# Patient Record
Sex: Male | Born: 1965 | Race: White | Hispanic: No | Smoking: Former smoker
Health system: Southern US, Community
[De-identification: ages and names within clinical notes are randomized; demographics above are authoritative.]

## PROBLEM LIST (undated history)

## (undated) DIAGNOSIS — E119 Type 2 diabetes mellitus without complications: Secondary | ICD-10-CM

## (undated) DIAGNOSIS — F32A Depression, unspecified: Secondary | ICD-10-CM

## (undated) DIAGNOSIS — F419 Anxiety disorder, unspecified: Secondary | ICD-10-CM

## (undated) DIAGNOSIS — F1010 Alcohol abuse, uncomplicated: Secondary | ICD-10-CM

## (undated) DIAGNOSIS — I10 Essential (primary) hypertension: Secondary | ICD-10-CM

## (undated) DIAGNOSIS — E785 Hyperlipidemia, unspecified: Secondary | ICD-10-CM

## (undated) DIAGNOSIS — F329 Major depressive disorder, single episode, unspecified: Secondary | ICD-10-CM

## (undated) DIAGNOSIS — K635 Polyp of colon: Secondary | ICD-10-CM

## (undated) DIAGNOSIS — M1990 Unspecified osteoarthritis, unspecified site: Secondary | ICD-10-CM

## (undated) DIAGNOSIS — J449 Chronic obstructive pulmonary disease, unspecified: Secondary | ICD-10-CM

## (undated) HISTORY — DX: Type 2 diabetes mellitus without complications: E11.9

## (undated) HISTORY — DX: Depression, unspecified: F32.A

## (undated) HISTORY — PX: TENDON TRANSFER: SHX2488

## (undated) HISTORY — DX: Anxiety disorder, unspecified: F41.9

## (undated) HISTORY — PX: OTHER SURGICAL HISTORY: SHX169

## (undated) HISTORY — DX: Unspecified osteoarthritis, unspecified site: M19.90

## (undated) HISTORY — DX: Hyperlipidemia, unspecified: E78.5

## (undated) HISTORY — DX: Major depressive disorder, single episode, unspecified: F32.9

## (undated) HISTORY — PX: HERNIA REPAIR: SHX51

## (undated) HISTORY — PX: KNEE RECONSTRUCTION: SHX5883

## (undated) HISTORY — DX: Polyp of colon: K63.5

## (undated) HISTORY — PX: TENDON TRANSFER: SHX6109

## (undated) HISTORY — DX: Essential (primary) hypertension: I10

---

## 2012-03-02 ENCOUNTER — Encounter: Payer: Self-pay | Admitting: Professional

## 2012-03-02 NOTE — Progress Notes (Signed)
Date/Time: 03/02/2012, 10:01 AM  Interviewer: Tristan Goodwin  Patient Name: Tristan Goodwin "[name]"  DOB: [birth year]  SSN:   Gender: male  Patient Address:  [address] [place], [place]  [address]  Patient Phone Number(s):  additional phone number: cell [phone]  Emergency Contact: Tristan Goodwin Phone [phone] Relationship to Pt: Spouse      Insurance Info:  Company: [employer]  Phone number:[phone]  Policy Holder (PH):   Tristan Goodwin  Relationship to PT: Self    PH DOB: [birth year]  Member ID: [id number]  Group #: [id number]  Insured Employer: [employer]  Insurance Address: [address] [place], [place]     Clinical Information  Presenting Problem:"I just enjoy drinking. That's it."  Precipitating Event: Wife asked him to get some help.    Suicidal/Homicidal Risk?    Any hx of Suicidal thoughts or plans? No    Any hx of Homicidal Thoughts or Plans?  No    Imminent Risk of Suicide and/or Harm to Others?  No       If answered "yes" to above question, complete RISK section below:    Patient is voluntary and has capacity to participate in program? yes   Medically stable/mobile? yes   Intact cognitive function? yes   Living situation stable to maintain outside of inpatient setting? yes   Transportation to and from the program services? yes   Additional Info (if needed):    Drug Use   Any current use of alcohol or drugs? alcohol    Drug of Choice # 1: Beer  Pattern of use: 6-12 beers daily  Last use? 03/02/2012    Drug of Choice # 2 :   Pattern of use:   Last use?     Drug of Choice # 3 :   Pattern of use:   Last use?:     Additional drug use info:      Current Withdrawal Symptoms: none reported    History of and last date of:    Hallucinations?no           Seizures?  no  DTs? no    Blackouts? no    Current Stressors and Issues:  Hx of drug/alcohol related legal issues? None reported  Current/pending charges?    Family Issues?Relationship w/spouse is strained.  Who is supportive of your tx and sobriety?none  reported    Job or finance issues? Pt reported job is tense, but not impacted by alcohol use.    Psych/Mental health Issues:Denied    Medical issues?none reported    Allergies? Denied    Current Medication, including dosage and time taken: none reported      Any previous treatment episodes or 12-step recovery experience?: none reported    Additional Notes:     Does the patient require Hard of Hearing Services ?Denied    Does the patient require language services?Denied    Preliminary Diagnosis:     Recommended Level of Care:     Appointment with:       Location:              Date:             Time:

## 2013-11-09 ENCOUNTER — Emergency Department: Payer: 59

## 2013-11-09 ENCOUNTER — Emergency Department
Admission: EM | Admit: 2013-11-09 | Discharge: 2013-11-09 | Disposition: A | Discharge status: Home / Self Care | Payer: 59 | Attending: Emergency Medicine | Admitting: Emergency Medicine

## 2013-11-09 DIAGNOSIS — M25461 Effusion, right knee: Secondary | ICD-10-CM

## 2013-11-09 DIAGNOSIS — M2391 Unspecified internal derangement of right knee: Secondary | ICD-10-CM

## 2013-11-09 DIAGNOSIS — S8990XA Unspecified injury of unspecified lower leg, initial encounter: Secondary | ICD-10-CM | POA: Insufficient documentation

## 2013-11-09 DIAGNOSIS — X58XXXA Exposure to other specified factors, initial encounter: Secondary | ICD-10-CM | POA: Insufficient documentation

## 2013-11-09 DIAGNOSIS — IMO0002 Reserved for concepts with insufficient information to code with codable children: Secondary | ICD-10-CM | POA: Insufficient documentation

## 2013-11-09 DIAGNOSIS — S8991XA Unspecified injury of right lower leg, initial encounter: Secondary | ICD-10-CM

## 2013-11-09 DIAGNOSIS — M25469 Effusion, unspecified knee: Secondary | ICD-10-CM | POA: Insufficient documentation

## 2013-11-09 DIAGNOSIS — S99929A Unspecified injury of unspecified foot, initial encounter: Secondary | ICD-10-CM | POA: Insufficient documentation

## 2013-11-09 MED ORDER — TETANUS-DIPHTHERIA TOXOIDS TD 5-2 LFU IM INJ
INJECTION | INTRAMUSCULAR | Status: AC
Start: 2013-11-09 — End: ?
  Filled 2013-11-09: qty 0.5

## 2013-11-09 MED ORDER — OXYCODONE-ACETAMINOPHEN 5-325 MG PO TABS
ORAL_TABLET | ORAL | Status: AC
Start: 2013-11-09 — End: ?
  Filled 2013-11-09: qty 1

## 2013-11-09 MED ORDER — OXYCODONE-ACETAMINOPHEN 5-325 MG PO TABS
1.0000 | ORAL_TABLET | Freq: Once | ORAL | Status: AC
Start: 2013-11-09 — End: 2013-11-09
  Administered 2013-11-09: 1 via ORAL

## 2013-11-09 MED ORDER — OXYCODONE-ACETAMINOPHEN 5-325 MG PO TABS
1.0000 | ORAL_TABLET | ORAL | Status: DC | PRN
Start: 2013-11-09 — End: 2017-09-15

## 2013-11-09 MED ORDER — TETANUS-DIPHTHERIA TOXOIDS TD 5-2 LFU IM INJ
0.5000 mL | INJECTION | Freq: Once | INTRAMUSCULAR | Status: AC
Start: 2013-11-09 — End: 2013-11-09
  Administered 2013-11-09: 0.5 mL via INTRAMUSCULAR

## 2013-11-09 MED ORDER — IBUPROFEN 800 MG PO TABS
800.0000 mg | ORAL_TABLET | Freq: Three times a day (TID) | ORAL | Status: DC | PRN
Start: 2013-11-09 — End: 2018-03-23

## 2013-11-09 NOTE — Discharge Instructions (Signed)
Knee Pain, Possible Torn Meniscus    The"meniscus"is a tough cartilage pad that cushions the inside of the knee joint. It serves as a shock absorber and spreads the weight of your body evenly across the knee joint. This prevents excess wear and tear to the bones of that joint.  The most common causes of meniscal tears are due to injury (especially related to sports) and degenerative disease (as occurs with aging).  A meniscus tear commonly occurs during a twisting injury when the knee is bent. This causes pain, swelling, reduced movement of the knee and difficulty walking. There may be popping, clicking, joint locking or inability to completely straighten the knee. Ligaments of the knee may also be injured.  Initial diagnosis of a torn meniscus is by physical exam and x-rays. In the case of an acute injury, the knee may be too painful to examine fully. A more accurate exam can be performed after the initial swelling goes down. An MRI (magnetic image scan) may be ordered to make a final diagnosis.  Initial treatment of a suspected meniscal injury is with ice and rest and preventing movement of the knee. A splint or Velcro knee immobilizer may be applied to protect the joint. Depending on the severity of the injury, surgery may be required. A cartilage injury may take 4-12 weeks to heal depending on the severity.  Home Care:  1. Stay off the injured leg as much as possible until you can walk on it without pain. If you have a lot of pain with walking, crutches or a walker may be prescribed. (These can be rented or purchased at many pharmacies and surgical or orthopedic supply stores). Follow your doctor's advice regarding when to begin bearing weight on that leg.  2. Keep your leg elevated to reduce pain and swelling. When sleeping, place a pillow under the injured leg. When sitting, support the injured leg so it is level with your waist. This is very important during the first 48 hours.  3. Apply an ice pack (ice  cubes in a plastic bag, wrapped in a towel) over the injured area for 20 minutes every 1-2 hours the first day. You can place the ice pack directly over the splint. If a Velcro knee immobilizer was applied, you can open this to apply the ice pack directly to the knee. Continue with ice packs 3-4 times a day for the next two days, then as needed for the relief of pain and swelling.  4. You may use acetaminophen (Tylenol) or ibuprofen (Motrin, Advil) to control pain, unless another pain medicine was prescribed. [NOTE: If you have chronic liver or kidney disease or ever had a stomach ulcer, talk with your doctor before using these medicines.]  5. If you were given a splint, keep it completely dry at all times. Bathe with your splint out of the water, protected with a large plastic bag, rubber-banded at the top end. If a fiberglass splint gets wet, you can dry it with a hair-dryer. If you have a Velcro knee immobilizer, you can remove this to bathe, unless told otherwise.  6. Check with your doctor before returning to sports or full work duties.  Follow Up  with your doctor, or as advised, within 1-2 weeks for another exam. Further testing may be required to assess the extent of your injury.  [NOTE: If X-rays were taken, they will be reviewed by a radiologist. You will be notified of any new findings that may affect your care.]    Get Prompt Medical Attention  if any of the following occur:   Toes or foot becomes swollen, cold, blue, numb or tingly   Pain or swelling increases over the knee or calf   Warmth or redness appears over the knee or calf   Shortness of breath or chest pain   Fever over 100.4F (38.0C)   2000-2014 [employer], [address], [place] [address]. All rights reserved. This information is not intended as a substitute for professional medical care. Always follow your healthcare professional's instructions.

## 2013-11-09 NOTE — ED Notes (Signed)
Co pain to r leg after return to bed 1 via xray.

## 2013-11-09 NOTE — ED Notes (Signed)
Pt given pillow for comfort. No other needs at this time. Family at bedside. Waiting for disposition.

## 2013-11-09 NOTE — ED Notes (Signed)
Bed: ED1-A  Expected date:   Expected time:   Means of arrival:   Comments:  EMS
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
## 2013-11-14 ENCOUNTER — Other Ambulatory Visit: Payer: Self-pay | Admitting: Orthopaedic Surgery

## 2013-11-14 DIAGNOSIS — M25561 Pain in right knee: Secondary | ICD-10-CM

## 2013-11-22 ENCOUNTER — Ambulatory Visit
Admission: RE | Admit: 2013-11-22 | Discharge: 2013-11-22 | Disposition: A | Discharge status: Home / Self Care | Payer: 59 | Source: Ambulatory Visit | Attending: Orthopaedic Surgery | Admitting: Orthopaedic Surgery

## 2013-11-22 DIAGNOSIS — M25469 Effusion, unspecified knee: Secondary | ICD-10-CM | POA: Insufficient documentation

## 2013-11-22 DIAGNOSIS — Y33XXXA Other specified events, undetermined intent, initial encounter: Secondary | ICD-10-CM | POA: Insufficient documentation

## 2013-11-22 DIAGNOSIS — S99919A Unspecified injury of unspecified ankle, initial encounter: Secondary | ICD-10-CM | POA: Insufficient documentation

## 2013-11-22 DIAGNOSIS — S8990XA Unspecified injury of unspecified lower leg, initial encounter: Secondary | ICD-10-CM | POA: Insufficient documentation

## 2013-11-22 DIAGNOSIS — S99929A Unspecified injury of unspecified foot, initial encounter: Secondary | ICD-10-CM | POA: Insufficient documentation

## 2013-11-22 DIAGNOSIS — S83509A Sprain of unspecified cruciate ligament of unspecified knee, initial encounter: Secondary | ICD-10-CM | POA: Insufficient documentation

## 2013-11-22 DIAGNOSIS — M25561 Pain in right knee: Secondary | ICD-10-CM

## 2014-05-31 HISTORY — PX: MEDIAL COLLATERAL LIGAMENT AND LATERAL COLLATERAL LIGAMENT REPAIR, KNEE: SHX2017

## 2014-05-31 HISTORY — PX: ANTERIOR CRUCIATE LIGAMENT REPAIR: SHX115

## 2015-12-09 ENCOUNTER — Emergency Department
Admission: EM | Admit: 2015-12-09 | Discharge: 2015-12-09 | Disposition: A | Discharge status: Home / Self Care | Payer: 59 | Attending: Emergency Medicine | Admitting: Emergency Medicine

## 2015-12-09 ENCOUNTER — Emergency Department: Payer: 59

## 2015-12-09 DIAGNOSIS — F419 Anxiety disorder, unspecified: Secondary | ICD-10-CM

## 2015-12-09 DIAGNOSIS — F418 Other specified anxiety disorders: Secondary | ICD-10-CM | POA: Insufficient documentation

## 2015-12-09 NOTE — Discharge Instructions (Signed)
Depression Affects Your Mind and Body  Everyone feels sad or "blue" from time to time for a few days or weeks.Depression is when these feelings don't go away and they interfere with daily life. Depression is a real illness. It makes you feel sad and helpless. It gets in the way of your life and relationships. It inhibits your ability to think and act. But, with help, you can feel better again.     "When I was depressed, I felt awful. I was so tired all the time I could hardly think, but at night I couldn't fall asleep. My head hurt. My stomach hurt. I didn't know what was wrong with me."   Depression affects your whole body  Brain chemicals affect your body as well as your mood. So depression may do more than just make you feel low. You may also feel bad physically. Depression can:   Cause trouble with mental tasks such as remembering, concentrating, or making decisions   Make you feel nervous and jumpy   Cause trouble sleeping. Or you may sleep too much   Change your appetite   Cause headaches, stomachaches, or other aches and pains   Drain your body of energy  Depression and other illness  It is common for people who have chronic health problems to also have depression. It can often be hard to tell which one caused the other. A person might become depressed after finding out they have a health problem. But some studies suggest being depressed may make certain health problems more likely. And some depressed people stop taking care of themselves. This may make them more likely to get sick.  Date Last Reviewed: 06/18/2013   2000-2016 [employer] [address], [place], [place] [address]. All rights reserved. This information is not intended as a substitute for professional medical care. Always follow your healthcare professional's instructions.          Your Body's Response to Anxiety    Normal anxiety is part of the body's natural defense system. It's an alert to a threat that is unknown,  vague, or comes from your own internal fears.While you're in this state, your feelings can range from a vague sense of worry to physical sensations such as a pounding heartbeat. These feelings make you want to react to the threat. An anxiety response is normal in many situations. But when you have an anxiety disorder, the same response can occur at the wrong times.  Anxiety can be helpful  Normal anxiety is a signal from your brain that warns you of a threat and is a normal response to help you prevent something or decrease the bad effects of something you can't control. For example, anxiety is a normal response to situations that might damage your body, separate you from a loved one, or lose your job. The symptoms of anxiety can be physical and mental.  How does it feel?  At certain times, people with anxiety may have:   Dizziness   Muscle tension or pain   Restlessness   Sleeplessness   Difficulty concentrating   Racing heartbeat   Fast breathing   Shaking or trembling   Stomachache   Diarrhea   Loss of energy   Sweating   Cold, clammy hands   Chest pain   Dry mouth  Anxiety can also be a problem  Anxiety can become a problem when it is difficult to control, occurs for months, and interferes with important parts of your  life. With an anxiety disorder, your body has the response described above, but in inappropriate ways. The response a person has depends on the anxiety disorder he or she has. With some disorders, the anxiety is way out of proportion to the threat that triggers it. With others, anxiety may occur even when there isn't a clear threat or trigger.  Who does it affect?  Some people are more prone to persistent anxiety than others. It tends to run in families, and it affects more younger people than older people. But no age, race, or gender is immune to anxiety problems.  Anxiety can be treated  The good news is that the anxiety that's disrupting your life can be treated. Working with  your doctor or other healthcare provider, you can develop skills to help you cope with anxiety. You can also gain the perspective you need to overcome your fears. Note:Good sources of support or guidance can be found at your local hospital, mental health clinic, or an employee assistance program.    If anxiety is wearing you down, here are some things you can do to cope:   Keep in mind that you can't control everything about a situation. Change what you can and let the rest take its course.   Exercise--it's a great way to relieve tension and help your body feel relaxed.   Avoid caffeine and nicotine, which can make anxiety symptoms worse.   Fight the temptation to turn to alcohol or unprescribed drugs for relief. They only make things worse in the long run.   Date Last Reviewed: 06/18/2013   2000-2016 [employer] [address] [place], [place] [address]. All rights reserved. This information is not intended as a substitute for professional medical care. Always follow your healthcare professional's instructions.
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
## 2015-12-09 NOTE — ED Provider Notes (Signed)
[facility] EMERGENCY DEPARTMENT History and Physical Exam      Patient Name: Tristan Goodwin, Tristan Goodwin  Encounter Date:  12/09/2015  Attending Physician: Theodora Blow, MD  PCP: Uvaldo Bristle, MD  Patient DOB:  1965-07-10  MRN:  [id number]  Room:  S20/S20-A      History of Presenting Illness     Chief complaint: Depression    HPI/ROS is limited by: none  HPI/ROS given by: patient    Location: Psychiatric  Duration: Several months  Severity: moderate    Alfonso Carden is a 50 y.o. male who presents with a request for a return to work note. 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 He states "ongoing for really bad divorce and very stressed out now". He states he was begun on Wellbutrin and Paxil by his PCP and does feel a noticeable improvement in his mood. He has not yet followed up with a psychiatrist as he was unable to get an appointment for greater than a month. He does have an appointment scheduled. He denies suicidal or homicidal ideation.    Review of Systems     Review of Systems   Constitutional: Negative for fever and chills.   HENT: Negative for voice change.    Eyes: Negative for photophobia and discharge.   Respiratory: Negative for shortness of breath and stridor.    Cardiovascular: Negative for chest pain and palpitations.   Gastrointestinal: Negative for vomiting and diarrhea.   Genitourinary: Negative for dysuria and difficulty urinating.   Musculoskeletal: Negative for joint swelling, gait problem and neck stiffness.   Skin: Negative for rash and wound.   Neurological: Negative for seizures and syncope.   Hematological: Negative for adenopathy. Does not bruise/bleed easily.   Psychiatric/Behavioral: Positive for behavioral problems and dysphoric mood. Negative for suicidal ideas, hallucinations, sleep disturbance, self-injury and agitation.      Allergies     Pt has No Known Allergies.    Medications     Current Outpatient Rx   Name  Route  Sig   Dispense  Refill   . buPROPion SR (WELLBUTRIN SR) 150 MG 12 hr tablet                     . ibuprofen (ADVIL,MOTRIN) 800 MG tablet    Oral    Take 1 tablet (800 mg total) by mouth every 8 (eight) hours as needed for Pain (take with food).    15 tablet    0     . metFORMIN (GLUCOPHAGE-XR) 500 MG 24 hr tablet                     . oxyCODONE-acetaminophen (PERCOCET) 5-325 MG per tablet    Oral    Take 1 tablet by mouth every 4 (four) hours as needed for Pain (do not take while driving or working).    10 tablet    0     . PARoxetine (PAXIL) 20 MG tablet                          Past Medical History     Pt has a past medical history of Diabetes mellitus; Depression; and Anxiety.    Past Surgical History     Pt has past surgical history that includes reconstructive knee surgery.    Family History     The family history includes Cancer in his father;  Diabetes in his father and mother.    Social History     Pt reports that he has quit smoking. He does not have any smokeless tobacco history on file. He reports that he does not drink alcohol or use illicit drugs.    Physical Exam     Blood pressure 154/97, pulse 96, temperature 98.3 F (36.8 C), temperature source Oral, resp. rate 16, height 1.829 m, weight 81.6 kg, SpO2 99 %.    Physical Exam   Constitutional: He is oriented to person, place, and time and well-developed, well-nourished, and in no distress. No distress.   HENT:   Head: Normocephalic and atraumatic.   Eyes: EOM are normal. Pupils are equal, round, and reactive to light. Right eye exhibits no discharge. Left eye exhibits no discharge.   Neck: Neck supple.   Cardiovascular: Normal rate, regular rhythm and normal heart sounds.    Pulmonary/Chest: Effort normal and breath sounds normal. No stridor.   Abdominal: Soft. Bowel sounds are normal. He exhibits no distension. There is no tenderness.   Musculoskeletal: Normal range of motion. He exhibits no edema or tenderness.   Neurological: He is alert and oriented to  person, place, and time. No cranial nerve deficit.   Skin: Skin is warm and dry. No rash noted.   Psychiatric: Mood, memory, affect and judgment normal.   Nursing note and vitals reviewed.     Orders Placed     No orders of the defined types were placed in this encounter.       Diagnostic Results       The results of the diagnostic studies below have been reviewed by myself:    Labs  Results     ** No results found for the last 24 hours. **          Radiologic Studies  Radiology Results (24 Hour)     ** No results found for the last 24 hours. **          EKG: none    ED Course & Treatment          MDM / Critical Care     Blood pressure 154/97, pulse 96, temperature 98.3 F (36.8 C), temperature source Oral, resp. rate 16, height 1.829 m, weight 81.6 kg, SpO2 99 %.    This patient presented with a psychiatric condition.  The patient was evaluated and their psychiatric illness has been determined by the ED MD along with appropriate ancillary mental health services, to be stable for outpatient management.  Differential diagnosis included but was not limited to depression, schizophrenia, bipolar disorder, psychosis, and suicidal or homicidal risk. The patient currently does not exhibit active suicidal or homicidal ideation and is felt to be at low risk for a suicide attempt.  The patient is not gravely disabled and has the means to obtain food and shelter.  Referrals to outpatient mental health resources have been provided prior to discharge and the patient has also been instructed to return immediately if any acute worsening their mental health occurs.  They have also been counseled to abstain from alcohol or drugs.  Diagnostic impression and plan were discussed with the patient and/or family.  Results of lab/radiology tests were reviewed and discussed with the patient and/or family. All questions were answered and concerns addressed.    This chart was generated by an EMR and may contain errors or omissions not  intended by the user.    Procedures     None  Diagnosis / Disposition     Clinical Impression  1. Anxiety and depression        Disposition  ED Disposition     Discharge Randalyn Rhea discharge to home/self care.    Condition at disposition: Stable            Prescriptions  Discharge Medication List as of 12/09/2015 10:41 AM          The documentation recorded by my scribe, Zane Herald, accurately reflects the services I personally performed and the decisions made by me. Theodora Blow MD.          Theodora Blow, MD  12/29/15 2108

## 2015-12-09 NOTE — ED Notes (Signed)
Pt reports he was given off work 6/12-6/22 by pcp for anxiety and depression. Work states they need him medically cleared to return to work.

## 2015-12-09 NOTE — ED Notes (Signed)
Crisis care at bedside for safety plan

## 2018-03-23 ENCOUNTER — Inpatient Hospital Stay
Admission: EM | Admit: 2018-03-23 | Discharge: 2018-03-24 | DRG: 896 | Disposition: A | Discharge status: Rehabilitation Facility | Payer: 59 | Attending: Internal Medicine | Admitting: Internal Medicine

## 2018-03-23 DIAGNOSIS — Y908 Blood alcohol level of 240 mg/100 ml or more: Secondary | ICD-10-CM | POA: Diagnosis present

## 2018-03-23 DIAGNOSIS — E43 Unspecified severe protein-calorie malnutrition: Secondary | ICD-10-CM | POA: Diagnosis present

## 2018-03-23 DIAGNOSIS — F10239 Alcohol dependence with withdrawal, unspecified: Secondary | ICD-10-CM | POA: Diagnosis present

## 2018-03-23 DIAGNOSIS — F10229 Alcohol dependence with intoxication, unspecified: Secondary | ICD-10-CM | POA: Diagnosis present

## 2018-03-23 DIAGNOSIS — R251 Tremor, unspecified: Secondary | ICD-10-CM

## 2018-03-23 DIAGNOSIS — K709 Alcoholic liver disease, unspecified: Secondary | ICD-10-CM | POA: Diagnosis present

## 2018-03-23 DIAGNOSIS — Z6821 Body mass index (BMI) 21.0-21.9, adult: Secondary | ICD-10-CM

## 2018-03-23 DIAGNOSIS — R110 Nausea: Secondary | ICD-10-CM

## 2018-03-23 DIAGNOSIS — Z9119 Patient's noncompliance with other medical treatment and regimen: Secondary | ICD-10-CM

## 2018-03-23 DIAGNOSIS — Z809 Family history of malignant neoplasm, unspecified: Secondary | ICD-10-CM

## 2018-03-23 DIAGNOSIS — F17210 Nicotine dependence, cigarettes, uncomplicated: Secondary | ICD-10-CM | POA: Diagnosis present

## 2018-03-23 DIAGNOSIS — Z833 Family history of diabetes mellitus: Secondary | ICD-10-CM

## 2018-03-23 DIAGNOSIS — R000 Tachycardia, unspecified: Secondary | ICD-10-CM

## 2018-03-23 DIAGNOSIS — F10231 Alcohol dependence with withdrawal delirium: Principal | ICD-10-CM | POA: Diagnosis present

## 2018-03-23 DIAGNOSIS — F10939 Alcohol use, unspecified with withdrawal, unspecified: Secondary | ICD-10-CM | POA: Diagnosis present

## 2018-03-23 DIAGNOSIS — E119 Type 2 diabetes mellitus without complications: Secondary | ICD-10-CM | POA: Diagnosis present

## 2018-03-23 DIAGNOSIS — F10931 Alcohol use, unspecified with withdrawal delirium: Secondary | ICD-10-CM

## 2018-03-23 LAB — COMPREHENSIVE METABOLIC PANEL
ALT: 225 U/L — ABNORMAL HIGH (ref 0–55)
AST (SGOT): 219 U/L — ABNORMAL HIGH (ref 10–42)
Albumin/Globulin Ratio: 1.48 Ratio (ref 0.80–2.00)
Albumin: 4.6 gm/dL (ref 3.5–5.0)
Alkaline Phosphatase: 60 U/L (ref 40–145)
Anion Gap: 24.5 mMol/L — ABNORMAL HIGH (ref 7.0–18.0)
BUN / Creatinine Ratio: 22.1 Ratio (ref 10.0–30.0)
BUN: 15 mg/dL (ref 7–22)
Bilirubin, Total: 1.1 mg/dL (ref 0.1–1.2)
CO2: 22.3 mMol/L (ref 20.0–30.0)
Calcium: 9 mg/dL (ref 8.5–10.5)
Chloride: 96 mMol/L — ABNORMAL LOW (ref 98–110)
Creatinine: 0.68 mg/dL — ABNORMAL LOW (ref 0.80–1.30)
EGFR: 109 mL/min/{1.73_m2} (ref 60–150)
Globulin: 3.1 gm/dL (ref 2.0–4.0)
Glucose: 133 mg/dL — ABNORMAL HIGH (ref 71–99)
Osmolality Calculated: 280 mOsm/kg (ref 275–300)
Potassium: 3.8 mMol/L (ref 3.5–5.3)
Protein, Total: 7.7 gm/dL (ref 6.0–8.3)
Sodium: 139 mMol/L (ref 136–147)

## 2018-03-23 LAB — ECG 12-LEAD
P Wave Axis: 51 deg
P-R Interval: 172 ms
Patient Age: 52 years
Q-T Interval(Corrected): 460 ms
Q-T Interval: 346 ms
QRS Axis: 56 deg
QRS Duration: 102 ms
T Axis: 59 years
Ventricular Rate: 106 //min

## 2018-03-23 LAB — CBC AND DIFFERENTIAL
Basophils %: 2 % (ref 0.0–3.0)
Basophils Absolute: 0.1 10*3/uL (ref 0.0–0.3)
Eosinophils %: 0.4 % (ref 0.0–7.0)
Eosinophils Absolute: 0 10*3/uL (ref 0.0–0.8)
Hematocrit: 44 % (ref 39.0–52.5)
Hemoglobin: 15.6 gm/dL (ref 13.0–17.5)
Lymphocytes Absolute: 0.8 10*3/uL (ref 0.6–5.1)
Lymphocytes: 19.5 % (ref 15.0–46.0)
MCH: 35 pg (ref 28–35)
MCHC: 36 gm/dL (ref 32–36)
MCV: 98 fL (ref 80–100)
MPV: 7.3 fL (ref 6.0–10.0)
Monocytes Absolute: 0.4 10*3/uL (ref 0.1–1.7)
Monocytes: 9.2 % (ref 3.0–15.0)
Neutrophils %: 68.9 % (ref 42.0–78.0)
Neutrophils Absolute: 2.8 10*3/uL (ref 1.7–8.6)
PLT CT: 114 10*3/uL — ABNORMAL LOW (ref 130–440)
RBC: 4.49 10*6/uL (ref 4.00–5.70)
RDW: 10.9 % — ABNORMAL LOW (ref 11.0–14.0)
WBC: 4.1 10*3/uL (ref 4.0–11.0)

## 2018-03-23 LAB — VH DEXTROSE STICK GLUCOSE: Glucose POCT: 438 mg/dL — ABNORMAL HIGH (ref 71–99)

## 2018-03-23 LAB — ETHANOL: Alcohol: 324 mg/dL (ref 0–9)

## 2018-03-23 MED ORDER — PHENOBARBITAL SODIUM 65 MG/ML IJ SOLN
INTRAMUSCULAR | Status: AC
Start: 2018-03-23 — End: ?
  Filled 2018-03-23: qty 2

## 2018-03-23 MED ORDER — LORAZEPAM 2 MG/ML IJ SOLN
INTRAMUSCULAR | Status: AC
Start: 2018-03-23 — End: ?
  Filled 2018-03-23: qty 1

## 2018-03-23 MED ORDER — INSULIN LISPRO 100 UNIT/ML SC SOPN
1.00 [IU] | PEN_INJECTOR | Freq: Every day | SUBCUTANEOUS | Status: DC | PRN
Start: 2018-03-23 — End: 2018-03-24

## 2018-03-23 MED ORDER — INFLUENZA VAC SPLIT QUAD 0.5 ML IM SUSY
0.50 mL | PREFILLED_SYRINGE | INTRAMUSCULAR | Status: DC | PRN
Start: 2018-03-23 — End: 2018-03-24

## 2018-03-23 MED ORDER — SODIUM CHLORIDE 0.9 % IV BOLUS
1000.0000 mL | Freq: Once | INTRAVENOUS | Status: AC
Start: 2018-03-23 — End: 2018-03-23
  Administered 2018-03-23: 1000 mL via INTRAVENOUS

## 2018-03-23 MED ORDER — PHENOBARBITAL 32.4 MG PO TABS
64.80 mg | ORAL_TABLET | Freq: Three times a day (TID) | ORAL | Status: DC
Start: 2018-03-23 — End: 2018-03-24
  Administered 2018-03-23 – 2018-03-24 (×3): 64.8 mg via ORAL
  Filled 2018-03-23 (×3): qty 2

## 2018-03-23 MED ORDER — ONDANSETRON HCL 4 MG/2ML IJ SOLN
4.0000 mg | Freq: Once | INTRAMUSCULAR | Status: AC
Start: 2018-03-23 — End: 2018-03-23
  Administered 2018-03-23: 4 mg via INTRAVENOUS

## 2018-03-23 MED ORDER — DIAZEPAM 5 MG PO TABS
5.00 mg | ORAL_TABLET | ORAL | Status: DC | PRN
Start: 2018-03-23 — End: 2018-03-24

## 2018-03-23 MED ORDER — LORAZEPAM 2 MG/ML IJ SOLN
0.5000 mg | Freq: Once | INTRAMUSCULAR | Status: AC
Start: 2018-03-23 — End: 2018-03-23
  Administered 2018-03-23: 0.5 mg via INTRAVENOUS

## 2018-03-23 MED ORDER — SODIUM CHLORIDE 0.9 % IJ SOLN
0.40 mg | INTRAMUSCULAR | Status: DC | PRN
Start: 2018-03-23 — End: 2018-03-24

## 2018-03-23 MED ORDER — VH VITAMIN B-1 100 MG PO TABS (WRAP)
100.00 mg | ORAL_TABLET | Freq: Every day | ORAL | Status: DC
Start: 2018-03-24 — End: 2018-03-24
  Administered 2018-03-24: 08:00:00 100 mg via ORAL
  Filled 2018-03-23: qty 1

## 2018-03-23 MED ORDER — SODIUM CHLORIDE 0.9 % IJ SOLN
3.00 mL | Freq: Three times a day (TID) | INTRAMUSCULAR | Status: DC
Start: 2018-03-23 — End: 2018-03-24
  Administered 2018-03-23 – 2018-03-24 (×2): 3 mL via INTRAVENOUS

## 2018-03-23 MED ORDER — PHENOBARBITAL SODIUM 65 MG/ML IJ SOLN
130.0000 mg | Freq: Once | INTRAMUSCULAR | Status: AC
Start: 2018-03-23 — End: 2018-03-23
  Administered 2018-03-23: 130 mg via INTRAVENOUS

## 2018-03-23 MED ORDER — INSULIN LISPRO 100 UNIT/ML SC SOPN
1.00 [IU] | PEN_INJECTOR | Freq: Every evening | SUBCUTANEOUS | Status: DC
Start: 2018-03-23 — End: 2018-03-24
  Administered 2018-03-23: 23:00:00 4 [IU] via SUBCUTANEOUS
  Filled 2018-03-23: qty 3

## 2018-03-23 MED ORDER — ENOXAPARIN SODIUM 40 MG/0.4ML SC SOLN
40.00 mg | SUBCUTANEOUS | Status: DC
Start: 2018-03-23 — End: 2018-03-24
  Filled 2018-03-23 (×2): qty 0.4

## 2018-03-23 MED ORDER — CEROVITE ADVANCED FORMULA PO TABS
1.00 | ORAL_TABLET | Freq: Every day | ORAL | Status: DC
Start: 2018-03-24 — End: 2018-03-24
  Administered 2018-03-24: 08:00:00 1 via ORAL
  Filled 2018-03-23: qty 1

## 2018-03-23 MED ORDER — FOLIC ACID 1 MG PO TABS
1.00 mg | ORAL_TABLET | Freq: Every day | ORAL | Status: DC
Start: 2018-03-24 — End: 2018-03-24
  Administered 2018-03-24: 08:00:00 1 mg via ORAL
  Filled 2018-03-23: qty 1

## 2018-03-23 MED ORDER — GLUCAGON 1 MG IJ SOLR (WRAP)
1.0000 mg | INTRAMUSCULAR | Status: DC | PRN
Start: 2018-03-23 — End: 2018-03-24

## 2018-03-23 MED ORDER — INSULIN LISPRO 100 UNIT/ML SC SOPN
1.00 [IU] | PEN_INJECTOR | Freq: Three times a day (TID) | SUBCUTANEOUS | Status: DC
Start: 2018-03-24 — End: 2018-03-24
  Administered 2018-03-24 (×2): 1 [IU] via SUBCUTANEOUS

## 2018-03-23 MED ORDER — DIAZEPAM 5 MG PO TABS
10.00 mg | ORAL_TABLET | ORAL | Status: DC | PRN
Start: 2018-03-23 — End: 2018-03-24

## 2018-03-23 MED ORDER — ONDANSETRON HCL 4 MG/2ML IJ SOLN
INTRAMUSCULAR | Status: AC
Start: 2018-03-23 — End: ?
  Filled 2018-03-23: qty 2

## 2018-03-23 MED ORDER — LORAZEPAM 2 MG/ML IJ SOLN
1.0000 mg | Freq: Once | INTRAMUSCULAR | Status: AC
Start: 2018-03-23 — End: 2018-03-23
  Administered 2018-03-23: 1 mg via INTRAVENOUS

## 2018-03-23 MED ORDER — DEXTROSE 10 % IV BOLUS
125.0000 mL | INTRAVENOUS | Status: DC | PRN
Start: 2018-03-23 — End: 2018-03-24

## 2018-03-23 NOTE — ED Notes (Signed)
Pt updated on room status and is pending transport. Pt is A&Ox4. Came in for weakness & shaking. Pt is a daily vodka drinker, last drink was at 1300. Lives at home with family. Pts most recent CIWA at 2035 was 0. Pt ambulated to the bathroom independently. Pt has had bagged lunch. Per, [facility], plan is to try and get pt admitted to [facility], pending approval from pts insurance. This will checked be in the AM.    Blood pressure 119/67, pulse (!) 109, temperature 98.1 F (36.7 C), resp. rate 21, height 1.829 m, weight 72.2 kg, SpO2 90 %.

## 2018-03-23 NOTE — H&P (Addendum)
Medicine History & Physical - [facility]  [employer]   Patient Name: Tristan Goodwin LOS: 0 days   Attending Physician: Etter Sjogren, MD PCP: Uvaldo Bristle, MD      Assessment and Plan:                                                                Alcohol intoxication/withdrawal  --Continue CIWA protocol, continue phenobarbital and Valium  --Seizure precaution, fall precaution  --[facility] trying to place the patient in inpatient rehab    Alcoholic liver disease  --Check hepatitis panel  --Monitor transaminases    Chronic alcoholism  --Has failed multiple inpatient rehab  --Counseled    Type 2 diabetes mellitus  --Sliding scale coverage for now    DVT PPx: lovenox  Dispo: Admit to telemetry  Healthcare Proxy:   Code: full code   Active Hospital Problem List  Active Problems:    * No active hospital problems. *     Subjective   History of Presenting Illness                                CC: Alcohol withdrawal  Tristan Goodwin is a 52 y.o. male patient who has history of type 2 diabetes mellitus non-insulin-dependent, chronic tobacco abuse and chronic alcoholism who presented today complaining of tremors consistent with alcohol withdrawal.  The patient apparently stopped drinking today and developed tremors which led him to come to ER for evaluation.  He is to drink half a gallon of vodka.  He realized that he has been drinking too much and decided to stop drinking today.  In the emergency room, the patient was found to have alcohol level of 325, tachycardic.  The patient was given Ativan and will be started on phenobarbital.  He denies any hallucinations.  She is he stated that his family summoned him to come to ER and stop drinking.  He admitted that he needed help.  He was seen by [facility] in ER and attempt to place him to an inpatient rehab in the next day or so.  His last inpatient rehab was in February 2019 and soon after that he relapsed.  He used to take metformin but had been  noncompliant with his follow-ups.  He was noted to have transaminitis consistent with alcoholic liver disease.    Review of Systems:    All systems were reviewed and are negative unless pertinent positive stated in HPI.  CONSTITUTIONAL: No night sweats. No fatigue. No fever or chills.   Eyes: No visual changes. No eye pain.   ENT: No runny nose. No epistaxis.   RESPIRATORY: No cough. No hemoptysis. No shortness of breath.   CARDIOVASCULAR: No chest pains. No palpitations.   GASTROINTESTINAL: No abdominal pain. No vomiting. No diarrhea or constipation.   GENITOURINARY: No urgency. No frequency. No dysuria.   MUSCULOSKELETAL: No musculoskeletal pain. No joint swelling.   NEUROLOGICAL: No headache or neck pain. No syncope or seizure.   PSYCHIATRIC: No depression, no psychosis.  SKIN: No rashes. No lesions. No petechiae.  ENDOCRINE: No unexplained weight loss. No polydipsia.   HEMATOLOGIC: No anemia. No purpura. No bleeding.   ALLERGIC AND IMMUNOLOGIC: No  pruritus. No swelling      Objective   Physical Exam:     Vitals: T:98.1 F (36.7 C),  BP:119/67, HR:(!) 107, RR:(!) 23, SaO2:90%    1) General Appearance: Alert and oriented x 4. In no acute distress.   2) Eyes: Pink conjunctiva, anicteric sclera. Pupils are equally reactive to light.  3) ENT: Oral mucosa moist with no pharyngeal congestion, erythema or swelling.  4) Neck: Supple, with full range of motion. Trachea is central, no JVD noted  5) Chest: Clear to auscultation bilaterally, no wheezes or rhonchi.  5) CVS: normal rate and regular rhythm, with no murmurs.  6) Abdomen: Soft, non-tender, no palpable mass. Bowel sounds normal. No CVA tenderness  7) Extremities: No pitting edema, pulses palpable, no calf swelling and gross no deformity.  8) Skin: Warm, dry with normal skin turgor, no rash  9) Lymphatics: No lymphadenopathy in axillary, cervical and inguinal area.   10) Neurological: Cranial nerves II-XII intact. No gross focal motor or sensory deficits  noted.  11) Psychiatric: Affect is appropriate. No hallucinations.  Patient Vitals for the past 12 hrs:   BP Temp Pulse Resp   03/23/18 2035 119/67 98.1 F (36.7 C) (!) 107 (!) 23   03/23/18 1942 123/73 -- (!) 103 (!) 29   03/23/18 1830 -- -- (!) 121 (!) 23   03/23/18 1800 148/82 -- (!) 112 18   03/23/18 1625 127/84 97.3 F (36.3 C) (!) 113 18     Weight Monitoring 11/09/2013 12/09/2015 03/23/2018   Height 60 cm 182.9 cm 182.9 cm   Height Method Stated Stated Stated   Weight 86.637 kg 81.6 kg 72.2 kg   Weight Method Stated Actual Actual   BMI (calculated) 241.2 kg/m2 24.4 kg/m2 21.6 kg/m2       EKG:    Recent Results (from the past 24 hour(s))   CBC    Collection Time: 03/23/18  4:36 PM   Result Value Ref Range    WBC 4.1 4.0 - 11.0 K/cmm    RBC 4.49 4.00 - 5.70 M/cmm    Hemoglobin 15.6 13.0 - 17.5 gm/dL    Hematocrit 91.4 78.2 - 52.5 %    MCV 98 80 - 100 fL    MCH 35 28 - 35 pg    MCHC 36 32 - 36 gm/dL    RDW 95.6 (L) 21.3 - 14.0 %    PLT CT 114 (L) 130 - 440 K/cmm    MPV 7.3 6.0 - 10.0 fL    NEUTROPHIL % 68.9 42.0 - 78.0 %    Lymphocytes 19.5 15.0 - 46.0 %    Monocytes 9.2 3.0 - 15.0 %    Eosinophils % 0.4 0.0 - 7.0 %    Basophils % 2.0 0.0 - 3.0 %    Neutrophils Absolute 2.8 1.7 - 8.6 K/cmm    Lymphocytes Absolute 0.8 0.6 - 5.1 K/cmm    Monocytes Absolute 0.4 0.1 - 1.7 K/cmm    Eosinophils Absolute 0.0 0.0 - 0.8 K/cmm    BASO Absolute 0.1 0.0 - 0.3 K/cmm   Alcohol Level    Collection Time: 03/23/18  4:36 PM   Result Value Ref Range    Alcohol 324 (HH) 0 - 9 mg/dL   CMP    Collection Time: 03/23/18  4:36 PM   Result Value Ref Range    Sodium 139 136 - 147 mMol/L    Potassium 3.8 3.5 - 5.3 mMol/L    Chloride 96 (L)  98 - 110 mMol/L    CO2 22.3 20.0 - 30.0 mMol/L    Calcium 9.0 8.5 - 10.5 mg/dL    Glucose 161 (H) 71 - 99 mg/dL    Creatinine 0.96 (L) 0.80 - 1.30 mg/dL    BUN 15 7 - 22 mg/dL    Protein, Total 7.7 6.0 - 8.3 gm/dL    Albumin 4.6 3.5 - 5.0 gm/dL    Alkaline Phosphatase 60 40 - 145 U/L    ALT 225 (H) 0 -  55 U/L    AST (SGOT) 219 (H) 10 - 42 U/L    Bilirubin, Total 1.1 0.1 - 1.2 mg/dL    Albumin/Globulin Ratio 1.48 0.80 - 2.00 Ratio    Anion Gap 24.5 (H) 7.0 - 18.0 mMol/L    BUN/Creatinine Ratio 22.1 10.0 - 30.0 Ratio    EGFR 109 60 - 150 mL/min/1.52m2    Osmolality Calculated 280 275 - 300 mOsm/kg    Globulin 3.1 2.0 - 4.0 gm/dL   ECG 12 lead (Specify reason for exam)    Collection Time: 03/23/18  8:46 PM   Result Value Ref Range    Patient Age 51 years    Patient DOB 05-30-1966     Patient Height      Patient Weight      Interpretation Text       Sinus tachycardia  Abnormal R-wave progression, early transition  No previous ECG available for comparison      Physician Interpreter      Ventricular Rate 106 //min    QRS Duration 102 ms    P-R Interval 172 ms    Q-T Interval 346 ms    Q-T Interval(Corrected) 460 ms    P Wave Axis 51 deg    QRS Axis 56 deg    T Axis 59 years        Past Medical History:   Diagnosis Date   . Anxiety    . Depression    . Diabetes mellitus       Past Surgical History:   Procedure Laterality Date   . reconstructive knee surgery        Family History   Problem Relation Age of Onset   . Diabetes Mother    . Diabetes Father    . Cancer Father       Social History     Tobacco Use   . Smoking status: Current Every Day Smoker     Packs/day: 2.00     Types: Cigarettes   . Smokeless tobacco: Never Used   Substance Use Topics   . Alcohol use: Yes     Comment: "half a gallon of vodka" every other day. States "a couple of shots" at 1300 today      No Known Allergies   No results found.   Home Medications     Med List Status:  In Progress Set By: Patrica Duel, RN at 03/23/2018  5:44 PM        No Medications         Meds given in the ED:  Medications   sodium chloride 0.9 % bolus 1,000 mL (1,000 mLs Intravenous New Bag 03/23/18 2042)   sodium chloride 0.9 % bolus 1,000 mL (0 mLs Intravenous Stopped 03/23/18 2032)   LORazepam (ATIVAN) injection 0.5 mg (0.5 mg Intravenous Given 03/23/18 1725)    LORazepam (ATIVAN) injection 1 mg (1 mg Intravenous Given 03/23/18 1822)   ondansetron (ZOFRAN) injection 4 mg (4 mg Intravenous  Given 03/23/18 1822)   PHENobarbital injection - MAINTENANCE dose 130 mg (130 mg Intravenous Given 03/23/18 2042)         Jenness Corner, MD     03/23/18,8:52 PM   MRN: [id number]                                      CSN: [id number] DOB: 12/31/1965

## 2018-03-23 NOTE — Consults (Signed)
Consults   Emailed admission's department at [facility] [facility] with info regarding patient's insurance contact and also emailed other [facility] clinician to update about current status of patient and desire to go to [facility] for detox.

## 2018-03-23 NOTE — Consults (Signed)
[facility] clinician met with patient. Patient scored a 39 on the Audit placing him in the severe risk category. Patient reports he is a daily drinker and has been for years. Patient reports he drinks about a 1/2 gallon of vodka every 2 days. Patient reports that in February 2019 he went to treatment in [place] for 34 days. Patient reports he stayed sober for a couple of days after he returned home from treatment. Patient reports he has been estranged from his two children for the past 2 years. Patient came to the hospital for help with his alcohol problem. Patient lost his job 2 weeks ago and the place where he lives yesterday. Patient wants help for his alcohol use. Patient signed a release for [facility] and clinician reached out to admission department. They currently have a bed available for the patient however, they are unable to verify his insurance until the morning. [facility] needs to verify his insurance prior to acceptance into their program. Clinician shared this information with patient's doctor and nurse and told them admissions would accept patient and have reviewed his records they just need to verify to make sure his insurance is still good through the end of the month. Spoke with the [employer] Rep. A few minutes ago and they said we would need to contact the main office at [phone] in the morning to verify patient's insurance.

## 2018-03-23 NOTE — Progress Notes (Signed)
NURSE NOTE SUMMARY  [facility] - MED TELEMETRY STEP-DOWN   Patient Name: Tristan Goodwin   Attending Physician: Jenness Corner, MD   Today's date:   03/23/2018 LOS: 0 days   Shift Summary:                                                              2200 Assumed care denies cp or sob, tele applied, assessment completed. Gevena Cotton     Provider Notifications:      Rapid Response Notifications:  Mobility:          Weight tracking:  Family Dynamic:   Last 3 Weights for the past 72 hrs (Last 3 readings):   Weight   03/23/18 2155 71.7 kg (158 lb)   03/23/18 1625 72.2 kg (159 lb 2.8 oz)             Recent Vitals Last Bowel Movement   BP: 130/77 (03/23/2018  9:47 PM)  Heart Rate: (!) 104 (03/23/2018  9:47 PM)  Temp: 98.2 F (36.8 C) (03/23/2018  9:47 PM)  Resp Rate: 15 (03/23/2018  9:47 PM)  Height: 1.829 m (6') (03/23/2018  9:55 PM)  Weight: 71.7 kg (158 lb) (03/23/2018  9:55 PM)  SpO2: 98 % (03/23/2018  9:47 PM)   No data recorded

## 2018-03-23 NOTE — ED Notes (Signed)
Dr. Tayag at bedside.

## 2018-03-23 NOTE — ED Notes (Signed)
Pt given a bagged lunch and a cup of water

## 2018-03-23 NOTE — ED Provider Notes (Addendum)
[facility]  EMERGENCY DEPARTMENT  History and Physical Exam     Patient Name: Tristan Goodwin, Tristan Goodwin  Encounter Date:  03/23/2018  Attending Physician: Elmon Else. Grace Isaac, M.D.   Room:  348/348-A  Patient DOB:  1966-04-23  Age: 52 y.o. male  MRN:  [id number]  PCP: Uvaldo Bristle, MD      MDM:  Diagnosis / Disposition:     This patient presents with alcohol withdrawal that is significant.  Evaluation and treatment has been initiated in the ER by myself and our ancillary health services and it has been determined that the patient may be unsafe for discharge due to potential threat of withdrawal, and therefore inpatient management of their illness is deemed necessary. Differential diagnosis included but was not limited to delirium tremens, alcohol abuse, polysubstance abuse, acute psychosis. The diagnostic impression and plan were discussed with the patient and/or family.  Results of lab tests were reviewed and discussed with the patient. Questions were answered and concerns addressed.  Appropriate consultation was made for admission and further treatment of this patient.          In addition to the above history, please see nursing notes. Allergies, meds, past medical, family, social hx, and the results of the diagnostic studies performed have been reviewed by myself.      Final Impression  1. Tachycardia    2. Alcohol withdrawal delirium    3. Tremor    4. Nausea        Disposition  ED Disposition     ED Disposition Condition Date/Time Comment    Admit  Thu Mar 23, 2018  8:56 PM Admitting Physician: Jenness Corner [[id number]]   Diagnosis: Alcohol withdrawal [291.81.ICD-9-CM]   Estimated Length of Stay: > or = to 2 midnights   Tentative Discharge Plan?: Home or Self Care [1]   Patient Class (>65 yrs old): Inpatient [101]            Follow up  No follow-up provider specified.    Prescriptions  There are no discharge medications for this patient.        History of Presenting Illness:     Vitals: Blood pressure  130/77, pulse (!) 104, temperature 98.2 F (36.8 C), temperature source Oral, resp. rate 15, height 1.829 m, weight 71.7 kg, SpO2 98 %.    Chief complaint: Alcohol Intoxication    HPI/ROS is limited by: none  HPI/ROS given by: patient    Location: Generalized  Quality: Tremors  Duration: 20+ Years  Severity: moderate    Tristan Goodwin is a 52 y.o. male who presents to the ED with complaint of alcohol intoxications. Patient has been a daily drinker for 20+ years. Reports he drinks about 0.5 gallons of vodka over the 2-3 days. His last drink was at 1PM today. Reports tremors, which he normally has when detoxing. Last attempt to detox was this past year. Reports he was unable to stay sober for long. Denies previous history of DTs during withdrawal. Patient is a current smoker (2 packs daily.) He has a history of COPD. Denies any other drug usage. Reports he recently became homeless yesterday and recently lost his job.         Review of Systems:  Physical Exam:     Review of Systems   Constitutional: Negative for chills, diaphoresis and fever.        Alcohol intoxication   HENT: Negative for congestion, ear pain, rhinorrhea and sore throat.    Eyes:  Negative.    Respiratory: Negative for cough, chest tightness, shortness of breath and wheezing.    Cardiovascular: Negative for chest pain, palpitations and leg swelling.   Gastrointestinal: Negative for abdominal pain, blood in stool, diarrhea, nausea and vomiting.   Endocrine: Negative.    Genitourinary: Negative for dysuria, flank pain, frequency, hematuria, penile pain, scrotal swelling, testicular pain and urgency.   Musculoskeletal: Negative for back pain, myalgias and neck pain.   Skin: Negative for rash and wound.   Allergic/Immunologic: Negative.    Neurological: Positive for tremors. Negative for dizziness, light-headedness and headaches.   Psychiatric/Behavioral: Negative for dysphoric mood, self-injury and suicidal ideas. The patient is not  nervous/anxious.    All other systems reviewed and are negative.      Physical Exam   Constitutional:   Moderately intoxicated appearing.   HENT:   Head: Normocephalic and atraumatic.   Right Ear: External ear normal.   Left Ear: External ear normal.   Eyes: Pupils are equal, round, and reactive to light. Conjunctivae and EOM are normal.   Neck: Normal range of motion. Neck supple. No thyromegaly present.   Cardiovascular: Normal rate, regular rhythm and normal heart sounds.   Pulmonary/Chest: Effort normal and breath sounds normal.   Abdominal: Soft. Bowel sounds are normal. He exhibits no distension. There is no tenderness.   Musculoskeletal: Normal range of motion.         General: No deformity or edema.   Neurological:   Tremulous in bilateral upper extremities   Skin: Skin is warm and dry. No erythema.   Psychiatric:   Disoriented. Slow to respond to question.   Nursing note and vitals reviewed.         Allergies / Medications:     Pt has No Known Allergies.    There are no discharge medications for this patient.        Past History:     Medical: Pt has a past medical history of Anxiety, Depression, and Diabetes mellitus.    Surgical: Pt  has a past surgical history that includes reconstructive knee surgery.    Family: The family history includes Cancer in his father; Diabetes in his father and mother.    Social: Pt reports that he has been smoking cigarettes. He has been smoking about 2.00 packs per day. He has never used smokeless tobacco. He reports current alcohol use. He reports that he does not use drugs.        Diagnostic Results:     The results of the diagnostic studies have been reviewed by myself:    Radiologic Studies  No results found.    Lab Studies  Labs Reviewed   CBC AND DIFFERENTIAL - Abnormal; Notable for the following components:       Result Value    RDW 10.9 (*)     PLT CT 114 (*)     All other components within normal limits   ETHANOL - Abnormal; Notable for the following components:     Alcohol 324 (*)     All other components within normal limits   COMPREHENSIVE METABOLIC PANEL - Abnormal; Notable for the following components:    Chloride 96 (*)     Glucose 133 (*)     Creatinine 0.68 (*)     ALT 225 (*)     AST (SGOT) 219 (*)     Anion Gap 24.5 (*)     All other components within normal limits   VH DEXTROSE STICK  GLUCOSE - Abnormal; Notable for the following components:    Glucose, POCT 438 (*)     All other components within normal limits   HEMOGLOBIN A1C   HEPATITIS PANEL, ACUTE   VH URINE DRUG SCREEN   MAGNESIUM   CBC AND DIFFERENTIAL   COMPREHENSIVE METABOLIC PANEL           Procedure / EKG / Image:     EKG Results     Procedure Component Value Units Date/Time    ECG 12 lead (Specify reason for exam) [[id number]] Collected:  03/23/18 2046     Updated:  03/23/18 2055     Patient Age 59 years      Patient DOB Feb 25, 1966     Patient Height --     Patient Weight --     Interpretation Text --     Sinus tachycardia  Abnormal R-wave progression, early transition  No previous ECG available for comparison  I reviewed and agree with the above findings.  Electronically Signed On 03-23-2018 20:53:58 EDT by Birdena Jubilee       Physician Interpreter Birdena Jubilee     Ventricular Rate 106 //min      QRS Duration 102 ms      P-R Interval 172 ms      Q-T Interval 346 ms      Q-T Interval(Corrected) 460 ms      P Wave Axis 51 deg      QRS Axis 56 deg      T Axis 59 years               ED Course:       Patient was reevaluated at this time.  Patient and/or family state that they are feeling better. Consulted with [facility]. They say they can transfer the patient tomorrow to [facility] for alcohol detox. They advise admission for observation until tomorrow.  Advised of Lab studies results. Advised of need for admission. Patient and/or family agreed to admission. Answered all questions. I discussed the case with the sound hospitalist who will admit the patient.     CONSULT  03/24/18 8:04 PM: Placed call to sound  hospitalist.  Call returned:  8:38PM  Physician coming to see patient          ATTESTATIONS     This chart was generated by an EMR and may contain errors or additions/omissions not intended by the user.    The documentation recorded by my scribe, Max Puray, which reflects the services I personally performed and the decisions made by me, Elmon Else. Grace Isaac, M.D.  I understand and acknowledge that I am responsible for the accuracy of the documentation    Elmon Else. Grace Isaac, M.D.          Etter Sjogren, MD  03/24/18 Royetta Crochet       Etter Sjogren, MD  03/24/18 [phone]

## 2018-03-24 ENCOUNTER — Telehealth (HOSPITAL_BASED_OUTPATIENT_CLINIC_OR_DEPARTMENT_OTHER): Payer: Self-pay | Admitting: Addiction (Substance Use Disorder)

## 2018-03-24 ENCOUNTER — Encounter (HOSPITAL_BASED_OUTPATIENT_CLINIC_OR_DEPARTMENT_OTHER): Payer: Self-pay | Admitting: Addiction (Substance Use Disorder)

## 2018-03-24 ENCOUNTER — Inpatient Hospital Stay
Admission: RE | Admit: 2018-03-24 | Discharge: 2018-03-31 | DRG: 895 | Disposition: A | Discharge status: Home / Self Care | Payer: 59 | Source: Ambulatory Visit | Attending: Addiction Medicine | Admitting: Addiction Medicine

## 2018-03-24 DIAGNOSIS — F1994 Other psychoactive substance use, unspecified with psychoactive substance-induced mood disorder: Secondary | ICD-10-CM | POA: Diagnosis present

## 2018-03-24 DIAGNOSIS — E119 Type 2 diabetes mellitus without complications: Secondary | ICD-10-CM | POA: Diagnosis present

## 2018-03-24 DIAGNOSIS — Z7984 Long term (current) use of oral hypoglycemic drugs: Secondary | ICD-10-CM

## 2018-03-24 DIAGNOSIS — Z79899 Other long term (current) drug therapy: Secondary | ICD-10-CM

## 2018-03-24 DIAGNOSIS — F10239 Alcohol dependence with withdrawal, unspecified: Principal | ICD-10-CM | POA: Diagnosis present

## 2018-03-24 DIAGNOSIS — K709 Alcoholic liver disease, unspecified: Secondary | ICD-10-CM | POA: Diagnosis present

## 2018-03-24 DIAGNOSIS — E43 Unspecified severe protein-calorie malnutrition: Secondary | ICD-10-CM

## 2018-03-24 DIAGNOSIS — F17210 Nicotine dependence, cigarettes, uncomplicated: Secondary | ICD-10-CM | POA: Diagnosis present

## 2018-03-24 DIAGNOSIS — I10 Essential (primary) hypertension: Secondary | ICD-10-CM | POA: Diagnosis present

## 2018-03-24 DIAGNOSIS — F419 Anxiety disorder, unspecified: Secondary | ICD-10-CM | POA: Diagnosis present

## 2018-03-24 DIAGNOSIS — D696 Thrombocytopenia, unspecified: Secondary | ICD-10-CM | POA: Diagnosis present

## 2018-03-24 DIAGNOSIS — R6889 Other general symptoms and signs: Secondary | ICD-10-CM | POA: Diagnosis present

## 2018-03-24 HISTORY — DX: Unspecified severe protein-calorie malnutrition: E43

## 2018-03-24 LAB — HEPATITIS PANEL, ACUTE
Hepatitis A IgM: NONREACTIVE
Hepatitis B Core IgM: NONREACTIVE
Hepatitis B Surface Antigen: NONREACTIVE
Hepatitis C Antibody: NONREACTIVE

## 2018-03-24 LAB — VH URINE DRUG SCREEN
Amphetamine: NEGATIVE
Barbiturates: POSITIVE — AB
Buprenorphine, Urine: NEGATIVE
Cannabinoids: NEGATIVE
Cocaine: NEGATIVE
Opiates: NEGATIVE
Phencyclidine: NEGATIVE
Urine Benzodiazepines: NEGATIVE
Urine Creatinine Random: 56.78 mg/dL
Urine Ecstasy Screen: NEGATIVE
Urine Fentanyl Screen: NEGATIVE
Urine Methadone Screen: NEGATIVE
Urine Oxycodone: NEGATIVE
Urine Specific Gravity: 1.009 (ref 1.001–1.040)
pH, Urine: 7.4 pH (ref 5.0–8.0)

## 2018-03-24 LAB — VH DEXTROSE STICK GLUCOSE
Glucose POCT: 266 mg/dL — ABNORMAL HIGH (ref 71–99)
Glucose POCT: 179 mg/dL — ABNORMAL HIGH (ref 71–99)
Glucose POCT: 196 mg/dL — ABNORMAL HIGH (ref 71–99)

## 2018-03-24 LAB — SYPHILIS SCREEN IGG AND IGM: Syphilis Screen IgG and IgM: NONREACTIVE

## 2018-03-24 LAB — CBC AND DIFFERENTIAL
Basophils %: 1.4 % (ref 0.0–3.0)
Basophils Absolute: 0 10*3/uL (ref 0.0–0.3)
Eosinophils %: 1.2 % (ref 0.0–7.0)
Eosinophils Absolute: 0 10*3/uL (ref 0.0–0.8)
Hematocrit: 35.9 % — ABNORMAL LOW (ref 39.0–52.5)
Hemoglobin: 12.6 gm/dL — ABNORMAL LOW (ref 13.0–17.5)
Lymphocytes Absolute: 0.9 10*3/uL (ref 0.6–5.1)
Lymphocytes: 26.7 % (ref 15.0–46.0)
MCH: 35 pg (ref 28–35)
MCHC: 35 gm/dL (ref 32–36)
MCV: 99 fL (ref 80–100)
MPV: 8.1 fL (ref 6.0–10.0)
Monocytes Absolute: 0.4 10*3/uL (ref 0.1–1.7)
Monocytes: 10.5 % (ref 3.0–15.0)
Neutrophils %: 60.2 % (ref 42.0–78.0)
Neutrophils Absolute: 2.1 10*3/uL (ref 1.7–8.6)
PLT CT: 90 10*3/uL — ABNORMAL LOW (ref 130–440)
RBC: 3.63 10*6/uL — ABNORMAL LOW (ref 4.00–5.70)
RDW: 10.7 % — ABNORMAL LOW (ref 11.0–14.0)
WBC: 3.4 10*3/uL — ABNORMAL LOW (ref 4.0–11.0)

## 2018-03-24 LAB — COMPREHENSIVE METABOLIC PANEL
ALT: 142 U/L — ABNORMAL HIGH (ref 0–55)
AST (SGOT): 107 U/L — ABNORMAL HIGH (ref 10–42)
Albumin/Globulin Ratio: 1.38 Ratio (ref 0.80–2.00)
Albumin: 3.3 gm/dL — ABNORMAL LOW (ref 3.5–5.0)
Alkaline Phosphatase: 50 U/L (ref 40–145)
Anion Gap: 14.2 mMol/L (ref 7.0–18.0)
BUN / Creatinine Ratio: 14.5 Ratio (ref 10.0–30.0)
BUN: 11 mg/dL (ref 7–22)
Bilirubin, Total: 1.2 mg/dL (ref 0.1–1.2)
CO2: 27.6 mMol/L (ref 20.0–30.0)
Calcium: 8.2 mg/dL — ABNORMAL LOW (ref 8.5–10.5)
Chloride: 101 mMol/L (ref 98–110)
Creatinine: 0.76 mg/dL — ABNORMAL LOW (ref 0.80–1.30)
EGFR: 104 mL/min/{1.73_m2} (ref 60–150)
Globulin: 2.4 gm/dL (ref 2.0–4.0)
Glucose: 285 mg/dL — ABNORMAL HIGH (ref 71–99)
Osmolality Calculated: 287 mOsm/kg (ref 275–300)
Potassium: 3.8 mMol/L (ref 3.5–5.3)
Protein, Total: 5.7 gm/dL — ABNORMAL LOW (ref 6.0–8.3)
Sodium: 139 mMol/L (ref 136–147)

## 2018-03-24 LAB — HEMOLYSIS INDEX: Hemolysis Index: 13 (ref 0–18)

## 2018-03-24 LAB — HIV AG/AB 4TH GENERATION: HIV Ag/Ab, 4th Generation: NONREACTIVE

## 2018-03-24 LAB — MAGNESIUM
Magnesium: 1.2 mg/dL — ABNORMAL LOW (ref 1.6–2.6)
Magnesium: 1.2 mg/dL — ABNORMAL LOW (ref 1.6–2.6)

## 2018-03-24 LAB — HEPATITIS A ANTIBODY, IGM: Hep A IgM: NONREACTIVE

## 2018-03-24 LAB — HEMOGLOBIN A1C: Hgb A1C, %: 6.2 %

## 2018-03-24 LAB — HEPATITIS B CORE ANTIBODY, IGM: Hepatitis B Core IgM: NONREACTIVE

## 2018-03-24 LAB — GGT: GGT: 247 U/L — ABNORMAL HIGH (ref 12–64)

## 2018-03-24 LAB — HEPATITIS B SURFACE ANTIGEN W/ REFLEX TO CONFIRMATION: Hepatitis B Surface Antigen: NONREACTIVE

## 2018-03-24 LAB — HEPATITIS C ANTIBODY: Hepatitis C, AB: NONREACTIVE

## 2018-03-24 MED ORDER — METFORMIN HCL 500 MG PO TABS
500.00 mg | ORAL_TABLET | Freq: Two times a day (BID) | ORAL | Status: DC
Start: 2018-03-24 — End: 2018-03-25
  Administered 2018-03-24 – 2018-03-25 (×3): 500 mg via ORAL
  Filled 2018-03-24 (×3): qty 1

## 2018-03-24 MED ORDER — FOLIC ACID 1 MG PO TABS
1.00 mg | ORAL_TABLET | Freq: Every day | ORAL | Status: DC
Start: 2018-03-25 — End: 2018-03-31
  Administered 2018-03-25 – 2018-03-31 (×7): 1 mg via ORAL
  Filled 2018-03-24 (×7): qty 1

## 2018-03-24 MED ORDER — METFORMIN HCL 500 MG PO TABS
500.00 mg | ORAL_TABLET | Freq: Two times a day (BID) | ORAL | 0 refills | Status: DC
Start: 2018-03-24 — End: 2018-03-27

## 2018-03-24 MED ORDER — CHLORDIAZEPOXIDE HCL 25 MG PO CAPS
50.00 mg | ORAL_CAPSULE | ORAL | Status: DC | PRN
Start: 2018-03-25 — End: 2018-03-25

## 2018-03-24 MED ORDER — NICOTINE POLACRILEX 2 MG MT GUM
2.00 mg | CHEWING_GUM | OROMUCOSAL | Status: DC | PRN
Start: 2018-03-24 — End: 2018-03-31

## 2018-03-24 MED ORDER — IBUPROFEN 600 MG PO TABS
600.00 mg | ORAL_TABLET | Freq: Four times a day (QID) | ORAL | Status: DC | PRN
Start: 2018-03-24 — End: 2018-03-29
  Administered 2018-03-26 – 2018-03-27 (×2): 600 mg via ORAL
  Filled 2018-03-24 (×2): qty 1

## 2018-03-24 MED ORDER — TAB-A-VITE/BETA CAROTENE PO TABS
1.00 | ORAL_TABLET | Freq: Every day | ORAL | Status: DC
Start: 2018-03-25 — End: 2018-03-31
  Administered 2018-03-25 – 2018-03-31 (×7): 1 via ORAL
  Filled 2018-03-24 (×7): qty 1

## 2018-03-24 MED ORDER — ONDANSETRON 4 MG PO TBDP
4.00 mg | ORAL_TABLET | Freq: Four times a day (QID) | ORAL | Status: DC | PRN
Start: 2018-03-24 — End: 2018-03-31

## 2018-03-24 MED ORDER — LOPERAMIDE HCL 2 MG PO CAPS
2.00 mg | ORAL_CAPSULE | ORAL | Status: DC | PRN
Start: 2018-03-24 — End: 2018-03-31
  Administered 2018-03-25 – 2018-03-26 (×5): 2 mg via ORAL
  Filled 2018-03-24 (×5): qty 1

## 2018-03-24 MED ORDER — CHLORDIAZEPOXIDE HCL 25 MG PO CAPS
25.00 mg | ORAL_CAPSULE | Freq: Three times a day (TID) | ORAL | Status: DC | PRN
Start: 2018-03-28 — End: 2018-03-25

## 2018-03-24 MED ORDER — FOLIC ACID 1 MG PO TABS
1.00 mg | ORAL_TABLET | Freq: Every day | ORAL | Status: AC
Start: 2018-03-25 — End: ?

## 2018-03-24 MED ORDER — TRAZODONE HCL 50 MG PO TABS
75.00 mg | ORAL_TABLET | Freq: Every evening | ORAL | Status: DC | PRN
Start: 2018-03-24 — End: 2018-03-31
  Administered 2018-03-24 – 2018-03-30 (×6): 75 mg via ORAL
  Filled 2018-03-24 (×6): qty 2

## 2018-03-24 MED ORDER — NICOTINE 21 MG/24HR TD PT24
1.00 | MEDICATED_PATCH | Freq: Every day | TRANSDERMAL | Status: DC
Start: 2018-03-24 — End: 2018-03-31
  Administered 2018-03-24 – 2018-03-31 (×7): 1 via TRANSDERMAL
  Filled 2018-03-24 (×8): qty 1

## 2018-03-24 MED ORDER — HYDROXYZINE PAMOATE 25 MG PO CAPS
25.00 mg | ORAL_CAPSULE | Freq: Four times a day (QID) | ORAL | Status: DC | PRN
Start: 2018-03-24 — End: 2018-03-31
  Administered 2018-03-26 – 2018-03-28 (×4): 25 mg via ORAL
  Filled 2018-03-24 (×4): qty 1

## 2018-03-24 MED ORDER — TUBERCULIN PPD 5 UNIT/0.1ML ID SOLN
0.10 mL | Freq: Once | INTRADERMAL | Status: DC
Start: 2018-03-25 — End: 2018-03-31

## 2018-03-24 MED ORDER — CHLORDIAZEPOXIDE HCL 5 MG PO CAPS
10.00 mg | ORAL_CAPSULE | Freq: Three times a day (TID) | ORAL | 0 refills | Status: DC | PRN
Start: 2018-03-24 — End: 2018-03-27

## 2018-03-24 MED ORDER — CHLORDIAZEPOXIDE HCL 25 MG PO CAPS
25.00 mg | ORAL_CAPSULE | ORAL | Status: DC | PRN
Start: 2018-03-26 — End: 2018-03-25

## 2018-03-24 MED ORDER — TUBERCULIN PPD 5 UNIT/0.1ML ID SOLN
0.10 mL | Freq: Once | INTRADERMAL | Status: DC
Start: 2018-03-24 — End: 2018-03-31

## 2018-03-24 MED ORDER — CHLORDIAZEPOXIDE HCL 25 MG PO CAPS
25.00 mg | ORAL_CAPSULE | Freq: Four times a day (QID) | ORAL | Status: DC | PRN
Start: 2018-03-27 — End: 2018-03-25

## 2018-03-24 MED ORDER — MAGNESIUM OXIDE 400 MG TABS (WRAP)
400.00 mg | ORAL_TABLET | Freq: Two times a day (BID) | ORAL | Status: DC
Start: 2018-03-24 — End: 2018-03-26
  Administered 2018-03-24 – 2018-03-26 (×4): 400 mg via ORAL
  Filled 2018-03-24 (×4): qty 1

## 2018-03-24 MED ORDER — THIAMINE (VITAMIN B1) 100 MG PO TABS (WRAP)
100.00 mg | ORAL_TABLET | Freq: Every day | ORAL | Status: DC
Start: 2018-03-25 — End: 2018-03-31
  Administered 2018-03-25 – 2018-03-31 (×7): 100 mg via ORAL
  Filled 2018-03-24 (×7): qty 1

## 2018-03-24 MED ORDER — FAMOTIDINE 20 MG PO TABS
20.00 mg | ORAL_TABLET | Freq: Two times a day (BID) | ORAL | Status: DC
Start: 2018-03-24 — End: 2018-03-31
  Administered 2018-03-24 – 2018-03-31 (×14): 20 mg via ORAL
  Filled 2018-03-24 (×14): qty 1

## 2018-03-24 MED ORDER — THIAMINE HCL 100 MG PO TABS
100.00 mg | ORAL_TABLET | Freq: Every day | ORAL | Status: AC
Start: 2018-03-25 — End: ?

## 2018-03-24 MED ORDER — ALUM & MAG HYDROXIDE-SIMETH 200-200-20 MG/5ML PO SUSP
30.00 mL | ORAL | Status: DC | PRN
Start: 2018-03-24 — End: 2018-03-31

## 2018-03-24 MED ORDER — THIAMINE HCL 100 MG/ML IJ SOLN
100.00 mg | Freq: Once | INTRAMUSCULAR | Status: DC
Start: 2018-03-24 — End: 2018-03-31

## 2018-03-24 MED ORDER — CHLORDIAZEPOXIDE HCL 25 MG PO CAPS
50.00 mg | ORAL_CAPSULE | ORAL | Status: DC | PRN
Start: 2018-03-24 — End: 2018-03-25
  Administered 2018-03-24 – 2018-03-25 (×3): 50 mg via ORAL
  Filled 2018-03-24 (×4): qty 2

## 2018-03-24 MED ORDER — MAGNESIUM HYDROXIDE 400 MG/5ML PO SUSP
30.00 mL | Freq: Every evening | ORAL | Status: DC | PRN
Start: 2018-03-24 — End: 2018-03-31

## 2018-03-24 NOTE — Progress Notes (Signed)
Spoke with Tessa Lerner from [facility] and patient has been accepted. Called RN to assist with arranging transport and was transferred to charge nurse to provide information to set up medical transport. Left a message and will call back after she states she is out of a meeting at 11:00.

## 2018-03-24 NOTE — Progress Notes (Signed)
[facility] PRE SCREEN    Date/Time:03/24/2018, 11:54 AM  Interviewer:Chaslyn Eisen    Patients Name (ALWAYS update Registration for Patient): Tristan Goodwin    Caller's Name; Telephone Number; Relationship to Patient: [phone]  Emergency Contact Name /Relationship/Telephone Number:  Tammy Sours Stewart/[phone]  Can we speak to this person regarding your admission?  yes     Referring Provider's Name and Telephone Number: Darol Destine, charge nurse for [facility] tele at [facility] [phone]    Does the patient require Hard of Hearing Services? no   Does the patient require Language Services? no     Insurance Information   Company: Health and safety inspector [facility]): self   Relationship to PT: self  PH DOB: [birth year]  Member ID: [id number]  End date is 03/30/18  Group #: [id number]  Blondell Reveal confirmed the coverage for this PT already through [employer], a [employer] member Hotel manager) and belongs to [employer]. Contacted Ansel Bong [phone] the [employer]'s business agent and he thought patient still has insurance and may have it for longer than the end of the month. Mr. Hollice Espy recommended verification for insurance be done by contacting Duane Lope or Juanita at the main office at [phone] and they can verify patient's insurance.    Clinical Information    Presenting Problem: Why are you seeking help? detox    Safety Information    Have you had thoughts about harming/suicide thought within the last month? no   ? If YES what were the circumstances? none  When was the last time within the 30 days that you felt this way? no    Have you had thoughts about harming/homicidal thoughts about others with the last month? no   ? If YES what were the circumstances?none  ? When was the last time within the 30days that you felt this way? no    Substance/Drug Use    Do you use tobacco? Yes, 2 ppd  Do you have a history or are you presently using drugs (including recreational drugs) or alcohol? yes   If YES, list and complete  below.  Drug of Choice # 1:Etoh, first drink at age 70  Pattern (Route, Amount) of use: BAC 324, vodka half gallon every three days  How long have you been using at this rate?   Date of Last Use? 03/24/18    Additional drug or alcohol use info? Phenobarb and ativan at ER    Are you currently experiencing Withdrawal Symptoms Yes    If YES, List: tremor    Are you presently experiencing visual hallucination or auditory hallucinations? no   If YES;   Is this substance/drug use related? no    Do you experience Seizures when withdrawing from drugs or alcohol? no     Psychiatric/Medical Information:    Are you presently receiving Psychiatric services/treatment? no   If YES,   ? Name of Provider and telephone number? none  ? Are you presently prescribed psychotropic medications? no  If YES, Please bring a list of all prescribed medications   What is your psychiatric diagnosis?  none     Do you have any medical issues that may interfere with physical mobility or treatment? no   If YES, List:     Are you Pregnant? no    Additional Comment: right leg had five surgeries    Recommended Level of Care (LOC): INPT Detox    Appointment Date: 03/24/18    Time: 5:30-6 PM    Location: [facility]

## 2018-03-24 NOTE — Telephone Encounter (Signed)
Spoke with Tristan Goodwin twice and we are waiting to hear from charge nurse about arranging the bed to bed transfer to the [facility] unit at [facility] hospital: [address] [place], [place] [address]-[place] can be contacted at [phone] to secure the bed.

## 2018-03-24 NOTE — UM Notes (Signed)
Tristan Poche, RN, BSN  Phone [phone]  Fax [phone]  Utilization Review  [facility]      Tristan Goodwin  1966-03-14  REF [id number]    Medicine History & Physical - [facility]  [facility]   Patient Name: Tristan Goodwin, Tristan Goodwin LOS: 0 days   Attending Physician: Etter Sjogren, MD PCP: Uvaldo Bristle, MD      Assessment and Plan:                                                                Alcohol intoxication/withdrawal  --Continue CIWA protocol, continue phenobarbital and Valium  --Seizure precaution, fall precaution  --[facility] trying to place the patient in inpatient rehab    Alcoholic liver disease  --Check hepatitis panel  --Monitor transaminases    Chronic alcoholism  --Has failed multiple inpatient rehab  --Counseled    Type 2 diabetes mellitus  --Sliding scale coverage for now    DVT PPx: lovenox  Dispo: Admit to telemetry  Healthcare Proxy:   Code: full code   Active Hospital Problem List  Active Problems:    * No active hospital problems. *       Subjective      History of Presenting Illness                                CC: Alcohol withdrawal  Tristan Goodwin is a 52 y.o. male patient who has history of type 2 diabetes mellitus non-insulin-dependent, chronic tobacco abuse and chronic alcoholism who presented today complaining of tremors consistent with alcohol withdrawal.  The patient apparently stopped drinking today and developed tremors which led him to come to ER for evaluation.  He is to drink half a gallon of vodka.  He realized that he has been drinking too much and decided to stop drinking today.  In the emergency room, the patient was found to have alcohol level of 325, tachycardic.  The patient was given Ativan and will be started on phenobarbital.  He denies any hallucinations.  She is he stated that his family summoned him to come to ER and stop drinking.  He admitted that he needed help.  He was seen by [facility] in ER and attempt to place him to an  inpatient rehab in the next day or so.  His last inpatient rehab was in February 2019 and soon after that he relapsed.  He used to take metformin but had been noncompliant with his follow-ups.  He was noted to have transaminitis consistent with alcoholic liver disease.    Review of Systems:    All systems were reviewed and are negative unless pertinent positive stated in HPI.  CONSTITUTIONAL: No night sweats. No fatigue. No fever or chills.   Eyes: No visual changes. No eye pain.   ENT: No runny nose. No epistaxis.   RESPIRATORY: No cough. No hemoptysis. No shortness of breath.   CARDIOVASCULAR: No chest pains. No palpitations.   GASTROINTESTINAL: No abdominal pain. No vomiting. No diarrhea or constipation.   GENITOURINARY: No urgency. No frequency. No dysuria.   MUSCULOSKELETAL: No musculoskeletal pain. No joint swelling.   NEUROLOGICAL: No headache or neck pain. No syncope or seizure.  PSYCHIATRIC: No depression, no psychosis.  SKIN: No rashes. No lesions. No petechiae.  ENDOCRINE: No unexplained weight loss. No polydipsia.   HEMATOLOGIC: No anemia. No purpura. No bleeding.   ALLERGIC AND IMMUNOLOGIC: No pruritus. No swelling       Objective          Physical Exam:      Vitals: T:98.1 F (36.7 C),  BP:119/67, HR:(!) 107, RR:(!) 23, SaO2:90%    1) General Appearance: Alert and oriented x 4. In no acute distress.   2) Eyes: Pink conjunctiva, anicteric sclera. Pupils are equally reactive to light.  3) ENT: Oral mucosa moist with no pharyngeal congestion, erythema or swelling.  4) Neck: Supple, with full range of motion. Trachea is central, no JVD noted  5) Chest: Clear to auscultation bilaterally, no wheezes or rhonchi.  5) CVS: normal rate and regular rhythm, with no murmurs.  6) Abdomen: Soft, non-tender, no palpable mass. Bowel sounds normal. No CVA tenderness  7) Extremities: No pitting edema, pulses palpable, no calf swelling and gross no deformity.  8) Skin: Warm, dry with normal skin turgor, no rash   9) Lymphatics: No lymphadenopathy in axillary, cervical and inguinal area.   10) Neurological: Cranial nerves II-XII intact. No gross focal motor or sensory deficits noted.  11) Psychiatric: Affect is appropriate. No hallucinations.  Patient Vitals for the past 12 hrs:   BP Temp Pulse Resp   03/23/18 2035 119/67 98.1 F (36.7 C) (!) 107 (!) 23   03/23/18 1942 123/73 - (!) 103 (!) 29   03/23/18 1830 - - (!) 121 (!) 23   03/23/18 1800 148/82 - (!) 112 18   03/23/18 1625 127/84 97.3 F (36.3 C) (!) 113 18     Weight Monitoring 11/09/2013 12/09/2015 03/23/2018   Height 60 cm 182.9 cm 182.9 cm   Height Method Stated Stated Stated   Weight 86.637 kg 81.6 kg 72.2 kg   Weight Method Stated Actual Actual   BMI (calculated) 241.2 kg/m2 24.4 kg/m2 21.6 kg/m2       EKG:     RecentResults          Recent Results (from the past 24 hour(s))   CBC    Collection Time: 03/23/18  4:36 PM   Result Value Ref Range    WBC 4.1 4.0 - 11.0 K/cmm    RBC 4.49 4.00 - 5.70 M/cmm    Hemoglobin 15.6 13.0 - 17.5 gm/dL    Hematocrit 16.1 09.6 - 52.5 %    MCV 98 80 - 100 fL    MCH 35 28 - 35 pg    MCHC 36 32 - 36 gm/dL    RDW 04.5 (L) 40.9 - 14.0 %    PLT CT 114 (L) 130 - 440 K/cmm    MPV 7.3 6.0 - 10.0 fL    NEUTROPHIL % 68.9 42.0 - 78.0 %    Lymphocytes 19.5 15.0 - 46.0 %    Monocytes 9.2 3.0 - 15.0 %    Eosinophils % 0.4 0.0 - 7.0 %    Basophils % 2.0 0.0 - 3.0 %    Neutrophils Absolute 2.8 1.7 - 8.6 K/cmm    Lymphocytes Absolute 0.8 0.6 - 5.1 K/cmm    Monocytes Absolute 0.4 0.1 - 1.7 K/cmm    Eosinophils Absolute 0.0 0.0 - 0.8 K/cmm    BASO Absolute 0.1 0.0 - 0.3 K/cmm   Alcohol Level    Collection Time: 03/23/18  4:36 PM  Result Value Ref Range    Alcohol 324 (HH) 0 - 9 mg/dL   CMP    Collection Time: 03/23/18  4:36 PM   Result Value Ref Range    Sodium 139 136 - 147 mMol/L    Potassium 3.8 3.5 - 5.3 mMol/L    Chloride 96 (L) 98 - 110 mMol/L    CO2 22.3 20.0 - 30.0 mMol/L    Calcium 9.0 8.5 - 10.5 mg/dL     Glucose 161 (H) 71 - 99 mg/dL    Creatinine 0.96 (L) 0.80 - 1.30 mg/dL    BUN 15 7 - 22 mg/dL    Protein, Total 7.7 6.0 - 8.3 gm/dL    Albumin 4.6 3.5 - 5.0 gm/dL    Alkaline Phosphatase 60 40 - 145 U/L    ALT 225 (H) 0 - 55 U/L    AST (SGOT) 219 (H) 10 - 42 U/L    Bilirubin, Total 1.1 0.1 - 1.2 mg/dL    Albumin/Globulin Ratio 1.48 0.80 - 2.00 Ratio    Anion Gap 24.5 (H) 7.0 - 18.0 mMol/L    BUN/Creatinine Ratio 22.1 10.0 - 30.0 Ratio    EGFR 109 60 - 150 mL/min/1.36m2    Osmolality Calculated 280 275 - 300 mOsm/kg    Globulin 3.1 2.0 - 4.0 gm/dL   ECG 12 lead (Specify reason for exam)    Collection Time: 03/23/18  8:46 PM   Result Value Ref Range    Patient Age 40 years    Patient DOB 1966/03/07     Patient Height      Patient Weight      Interpretation Text       Sinus tachycardia  Abnormal R-wave progression, early transition  No previous ECG available for comparison      Physician Interpreter      Ventricular Rate 106 //min    QRS Duration 102 ms    P-R Interval 172 ms    Q-T Interval 346 ms    Q-T Interval(Corrected) 460 ms    P Wave Axis 51 deg    QRS Axis 56 deg    T Axis 59 years            PastMedicalHistory   Past Medical History:   Diagnosis Date   . Anxiety    . Depression    . Diabetes mellitus          PastSurgicalHistory         Past Surgical History:   Procedure Laterality Date   . reconstructive knee surgery           FamilyHistory           Family History   Problem Relation Age of Onset   . Diabetes Mother    . Diabetes Father    . Cancer Father          Social History           Tobacco Use   . Smoking status: Current Every Day Smoker     Packs/day: 2.00     Types: Cigarettes   . Smokeless tobacco: Never Used   Substance Use Topics   . Alcohol use: Yes     Comment: "half a gallon of vodka" every other day. States "a couple of shots" at 1300 today      No Known Allergies   No results found.       Home Medications     Med List  Status:  In  Progress Set By: Patrica Duel, RN at 03/23/2018  5:44 PM        No Medications         Meds given in the ED:  Medications   sodium chloride 0.9 % bolus 1,000 mL (1,000 mLs Intravenous New Bag 03/23/18 2042)   sodium chloride 0.9 % bolus 1,000 mL (0 mLs Intravenous Stopped 03/23/18 2032)   LORazepam (ATIVAN) injection 0.5 mg (0.5 mg Intravenous Given 03/23/18 1725)   LORazepam (ATIVAN) injection 1 mg (1 mg Intravenous Given 03/23/18 1822)   ondansetron (ZOFRAN) injection 4 mg (4 mg Intravenous Given 03/23/18 1822)   PHENobarbital injection - MAINTENANCE dose 130 mg (130 mg Intravenous Given 03/23/18 2042)             Jenness Corner, MD     03/23/18,8:52 PM   MRN: [id number]                                      CSN: [id number] DOB: 17-Oct-1965                 Ativan 0.5mg  x1, phenobarvital 64.8mg  Q8 po, CIWA, consult SW, tele, seizure precaution,

## 2018-03-24 NOTE — Progress Notes (Signed)
03/24/18 1313   Case Management Quick Doc   Case Management Assessment Status Assessment Complete   CM Comments RNCM 03/24/18:  Admitted for Alcohol detox.  Is homeless.  Agreeable to Detox treatment at [place] [facility] [facility]-they have confirmed insurance and are ready to accept pt today.  RN to call report to [phone], MD aware.  Transport arranged for 1530.     Silver Huguenin RN  Case Manager  [phone]

## 2018-03-24 NOTE — Discharge Summary (Signed)
Medicine Discharge Summary - [facility]  [employer]   Patient Name: Randalyn Rhea   Attending Physician: Obie Dredge, * PCP: Uvaldo Bristle, MD   Date of Admission: 03/23/2018 D/C Date: 03/24/2018   Discharge Diagnoses:     Alcohol dependence with withdrawal    Alcoholic liver disease    Chronic alcoholism    Type 2 diabetes mellitus, HgbA1c 6.2     Tobacco dependent    Severe Protein Energy Malnutrition      Hospital Course     Jaciel Diem is a 52 y.o. male patient who has history of type 2 diabetes mellitus non-insulin-dependent, chronic tobacco abuse and chronic alcoholism who presented today complaining of tremors consistent with alcohol withdrawal.  The patient apparently stopped drinking , attempted to detoxify and developed tremors which led him to come to ER for evaluation.  He drinks  half a gallon of vodka.  He realized that he has been drinking too much and decided to stop drinking on the day of admission.  In the emergency room, the patient was found to have alcohol level of 325, tachycardic.   He denies any hallucinations.  She is he stated that his family summoned him to come to ER and stop drinking.  He admitted that he needed help.  He was seen by [facility] in ER and attempt to place him to an inpatient rehab.   His last inpatient rehab was in February 2019 and soon after that he relapsed.  He used to take metformin but had been noncompliant with his follow-ups.  He was noted to have transaminitis consistent with alcoholic liver disease.  Patient was admitted and be monitored on CIWA protocol with score driven benzodiazepine as Valium ; also was given phenobarbital .  Also started on thiamine and folic acid.  Patient has no severe withdrawal symptoms and his CIWA score of  4-6.  Patient is agreeable to detoxified treatment at [facility] and has been accepted in the facility and he is being transferred to.  Patient is stable for discharge to inpatient  substance abuse rehab facility and he is to continue initial detoxification process per their protocol.    Active Problems:    Alcohol withdrawal    Severe protein-calorie malnutrition  Resolved Problems:    * No resolved hospital problems. *        Discharge Instructions:        Disposition: Inpatient alcohol Detox  rehab facility  Diet: Regular Diet  Activity: As tolerated  Discharge Code Status: Full Code  Uvaldo Bristle, MD  [address]  [address]  [place] [address]  [phone]           Discharge Medications:                                                                      Discharge Medication List      Taking    chlordiazePOXIDE 5 MG capsule  Dose:  10 mg  Commonly known as:  LIBRIUM  Take 2 capsules (10 mg total) by mouth 3 (three) times daily as needed (Alcohol withdrawal)     folic acid 1 MG tablet  Dose:  1 mg  Commonly known  as:  FOLVITE  Start taking on:  March 25, 2018  Take 1 tablet (1 mg total) by mouth daily     metFORMIN 500 MG tablet  Dose:  500 mg  Commonly known as:  GLUCOPHAGE  Take 1 tablet (500 mg total) by mouth 2 (two) times daily with meals     thiamine 100 MG tablet  Dose:  100 mg  Commonly known as:  B-1  Start taking on:  March 25, 2018  Take 1 tablet (100 mg total) by mouth daily           Discharge Day Exam (03/24/2018):   Blood pressure 140/88, pulse 91, temperature 98.4 F (36.9 C), temperature source Oral, resp. rate 17, height 1.829 m (6'), weight 71.7 kg (158 lb), SpO2 95 %.    General: Patient is awake. In no acute distress.  HEENT: PERRL, EOMI, no conjunctival drainage, vision is intact.  Neck: supple, no thyromegaly.  Chest: CTA bilaterally. No rhonchi, no wheezing. No use of accessory muscles.  CVS: normal rate and regular rhythm no murmurs, without JVD.  Abdomen: soft, non-tender, no guarding or rigidity, with normal bowel sounds.  Extremities: No pitting edema, pulses palpable, no calf swelling and gross no deformity.  Skin: Warm, dry, no rash and no  worrisome lesions.  NEURO: no motor or sensory deficits, some tremors   Psychiatric: alert, interactive, appropriate, normal affect.   Recent Labs    Recent Labs   Lab 03/24/18  0244 03/23/18  1636   WBC 3.4* 4.1   RBC 3.63* 4.49   Hemoglobin 12.6* 15.6   Hematocrit 35.9* 44.0   MCV 99 98   PLT CT 90* 114*             Lab Results   Component Value Date    HGBA1CPERCNT 6.2 03/23/2018     Recent Labs   Lab 03/24/18  0244 03/23/18  1636   Glucose 285* 133*   Sodium 139 139   Potassium 3.8 3.8   Chloride 101 96*   CO2 27.6 22.3   BUN 11 15   Creatinine 0.76* 0.68*   EGFR 104 109   Calcium 8.2* 9.0     Recent Labs   Lab 03/24/18  0244 03/23/18  1636   Magnesium 1.2*  --    Albumin 3.3* 4.6   Protein, Total 5.7* 7.7   Bilirubin, Total 1.2 1.1   Alkaline Phosphatase 50 60   ALT 142* 225*   AST (SGOT) 107* 219*      Allergies:      Patient has no known allergies.   Time spent on discharging the patient:  32  minutes   No results found.   Obie Dredge, MD         03/24/18 2:28 PM   MRN: [id number]                                      CSN: [id number] DOB: 12-18-1965

## 2018-03-24 NOTE — UM Notes (Signed)
Tristan Poche, RN, BSN  Phone [phone]  Fax [phone]  Utilization Review  [facility]      Tristan Goodwin  [birth year]  Ref [id number]  Pt is being transferred to [place] [facility]        Medicine Discharge Summary - [facility]  [employer]   Patient Name: Tristan Goodwin   Attending Physician: Obie Dredge, * PCP: Uvaldo Bristle, MD   Date of Admission: 03/23/2018 D/C Date: 03/24/2018   Discharge Diagnoses:     Alcohol dependence with withdrawal    Alcoholic liver disease    Chronic alcoholism    Type 2 diabetes mellitus, HgbA1c 6.2     Tobacco dependent    Severe Protein Energy Malnutrition      Hospital Course     Tristan Goodwin is a 52 y.o. male patient who has history of type 2 diabetes mellitus non-insulin-dependent, chronic tobacco abuse and chronic alcoholism who presented today complaining of tremors consistent with alcohol withdrawal. The patient apparently stopped drinking , attempted to detoxify and developed tremors which led him to come to ER for evaluation. He drinks  half a gallon of vodka. He realized that he has been drinking too much and decided to stop drinking on the day of admission. In the emergency room, the patient was found to have alcohol level of 325, tachycardic. He denies any hallucinations. She is he stated that his family summonedhim to come to ER and stop drinking. He admitted that he needed help. He was seen by [facility] in ER and attempt to place him to an inpatient rehab.  His last inpatient rehab was in February 2019 and soon after that he relapsed. He used to take metformin but had been noncompliant with his follow-ups. He was noted to have transaminitis consistent with alcoholic liver disease.  Patient was admitted and be monitored on CIWA protocol with score driven benzodiazepine as Valium ; also was given phenobarbital .  Also started on thiamine and folic acid.  Patient has no severe withdrawal symptoms and his  CIWA score of  4-6.  Patient is agreeable to detoxified treatment at [facility] and has been accepted in the facility and he is being transferred to.  Patient is stable for discharge to inpatient substance abuse rehab facility and he is to continue initial detoxification process per their protocol.    Active Problems:    Alcohol withdrawal    Severe protein-calorie malnutrition  Resolved Problems:    * No resolved hospital problems. *        Discharge Instructions:        Disposition: Inpatient alcohol Detox  rehab facility  Diet: Regular Diet  Activity: As tolerated  Discharge Code Status: Full Code  Uvaldo Bristle, MD  [address]  [address]  [place] [address]  [phone]           Discharge Medications:                                                                          Discharge Medication List      Taking    chlordiazePOXIDE 5 MG capsule  Dose:  10 mg  Commonly known as:  LIBRIUM  Take 2 capsules (10 mg total) by mouth 3 (three) times daily as needed (Alcohol withdrawal)     folic acid 1 MG tablet  Dose:  1 mg  Commonly known as:  FOLVITE  Start taking on:  March 25, 2018  Take 1 tablet (1 mg total) by mouth daily     metFORMIN 500 MG tablet  Dose:  500 mg  Commonly known as:  GLUCOPHAGE  Take 1 tablet (500 mg total) by mouth 2 (two) times daily with meals     thiamine 100 MG tablet  Dose:  100 mg  Commonly known as:  B-1  Start taking on:  March 25, 2018  Take 1 tablet (100 mg total) by mouth daily           Discharge Day Exam (03/24/2018):   Blood pressure 140/88, pulse 91, temperature 98.4 F (36.9 C), temperature source Oral, resp. rate 17, height 1.829 m (6'), weight 71.7 kg (158 lb), SpO2 95 %.    General: Patient is awake. In no acute distress.  HEENT: PERRL, EOMI, no conjunctival drainage, vision is intact.  Neck: supple, no thyromegaly.  Chest: CTA bilaterally. No rhonchi, no wheezing. No use of accessory muscles.  CVS: normal rate and regular rhythm no murmurs,  without JVD.  Abdomen: soft, non-tender, no guarding or rigidity, with normal bowel sounds.  Extremities: No pitting edema, pulses palpable, no calf swelling and gross no deformity.  Skin: Warm, dry, no rash and no worrisome lesions.  NEURO: no motor or sensory deficits, some tremors   Psychiatric: alert, interactive, appropriate, normal affect.   Recent Labs         Recent Labs   Lab 03/24/18  0244 03/23/18  1636   WBC 3.4* 4.1   RBC 3.63* 4.49   Hemoglobin 12.6* 15.6   Hematocrit 35.9* 44.0   MCV 99 98   PLT CT 90* 114*                   Lab Results   Component Value Date    HGBA1CPERCNT 6.2 03/23/2018          Recent Labs   Lab 03/24/18  0244 03/23/18  1636   Glucose 285* 133*   Sodium 139 139   Potassium 3.8 3.8   Chloride 101 96*   CO2 27.6 22.3   BUN 11 15   Creatinine 0.76* 0.68*   EGFR 104 109   Calcium 8.2* 9.0          Recent Labs   Lab 03/24/18  0244 03/23/18  1636   Magnesium 1.2*  --    Albumin 3.3* 4.6   Protein, Total 5.7* 7.7   Bilirubin, Total 1.2 1.1   Alkaline Phosphatase 50 60   ALT 142* 225*   AST (SGOT) 107* 219*      Allergies:      Patient has no known allergies.   Time spent on discharging the patient:  32  minutes   No results found.   Obie Dredge, MD         03/24/18 2:28 PM   MRN: [id number]                                      CSN: [id number] DOB: May 01, 1966

## 2018-03-24 NOTE — [facility] Treatment History (Signed)
CHEMICAL DEPENDENCE AND MENTAL HEALTH TREATMENT HISTORY  INCLUDE BOTH INPATIENT AND OUTPATIENT TREATMENT ATTEMPTS            Facility Name Dates Presenting Problem Outcome/Years [facility] - [place], [place] Jan - March 2019 etoh use d/o Few days after discharge   No psychiatrist or therapist

## 2018-03-24 NOTE — Progress Notes (Signed)
Initial Nursing Assessment/Intake  Date:  03/24/2018  Time: 7:49 PM  Tristan Goodwin [id number]:  A 52 y.o. male was assessed, for admission to [facility] IP.    Reason:  Tristan Goodwin was transferred from [facility] ED by physician transport bed-to-bed to be admitted to [facility] IP d/t reports drinking 1/2 gallon of vodka every 2-3 days. Smokes 2ppd. Denies any other substances. Reports that he lost his job 3 weeks ago d/t drinking and was kicked out of his house 1.5 weeks ago d/t not being able to pay rent. 3 prior marriages. Divorces 4 years ago on last marriage. Reports that he is not allowed by his ex wife to see his children. Main support system is 1st ex-wife and friends. Denies current SI/HI/SIB. Patient was noted to have bruising on R rib cage, L armpit area and R scapular area. Denies hx of falls or seizures. Patient is unsure on how he obtained said bruises.     Current BAL/UDS:    Common Labs 03/24/2018   Breathalyzer Positive/Negative: Negative        Current V/S:    Vitals:    03/24/18 1804   BP: (!) 127/91   Pulse: 99   Resp: 16   Temp: 98.4 F (36.9 C)   SpO2: 97%       Current known Allergies:  Patient has no known allergies.    Problem and Patient Goal  Presenting Problem: etoh use disorder  What made you decide to come in for treatment at this time?  : "I'm tired of it"  What is your perception of your mental health/substance use issue?: it's a problem  When was the last time the current mental health/substance abuse issue was manageable?: 15 years ago  Patient Stated Goal: "to stop drinking"  Status: Voluntary  Status Information Provided by: patient, chart documents  Describe medication issues:: denies                Current Medications:  Metformin 500mg  BID    Current or Recent Stressors:  Impact of Substance Abuse/Mental Health Issue  How has your job been affected?: lost job d/t drinking 3 weeks ago  Who is the EAP Representative involved in your referral?: n/a  How has your MH/SA affected your  financial status?: lost job, lost house  How has your MH/SA affected your relationships?: divorced with ex-wife  Drug/Alcohol Related Charges and Other Arrests: denies  Do you have any recent legal concerns?: denies  Location manager Name (if applicable): n/a    HOH:  Tristan Goodwin is not Hearing Impaired  Special needs paperwork completed? Yes    Belongings searched?  Yes.  See belongings sheet    History:   Past Psychiatric History:   Previous diagnoses:  Yes Depression and anxiety  Previous suicide attempts:  No  Current risk (TASR- risk assessment):    Tool for Assessment of Suicide Risk  Individual Risk Profile: male, poor social support, chronic mental illness  Symptom Risk Profile: depressive symptoms  Interview Risk Profile: recent substance abuse  Protective Factors: internalized teachings against suicide  Level of Suicide Risk: Low     Self-injurious behavior/risky behavior:  Self-Harm Assessment  Self-Harm History: No  Family Suicide History: No  Safety Plan:: No  Deterrents: Spouse  Triggers: None known    Substance Abuse History:  Detailed Drug Use History  Any current alcohol and/or drug use?: Yes  Reason for Alcohol or Drug Use: "mostly cause I like it and manage my issues"  Alcohol  Use: In Lifetime             Age first used alcohol:  : 75            Max used in a day: unsure            Pattern of use:: half a gallon of vodka every other day - 3 days for the past 15 years            Last use:: 03/23/18            Longest Abstinence:  : 35~ days while in rehab  Tranquilizers/Benzodiazepines:: n/a  Tranquilizers/Benzodiazepines #2:: n/a  Tranquilizers/Benzodiazepines #3: n/a  Heroin Use: N/A  Rx Opiates #1: : n/a(prescribed post op, denies abuse)  Rx Opiates #2: : n/a  Rx Opiates #3: : n/a  Opiates Use #4:: n/a  Opiates Use #5:: n/a  Opiates Use #6:: n/a  Non-Rx Methadone Use: N/A  Amphetamine Use: N/A  Marijuana Use: N/A(use hx during teenager )  Cocaine Use: N/A  Hallucinogens Use: N/A  Inhalants  Use: N/A  PCP Use: N/A  Nicotine Use: In Lifetime, Smoked            Age first used nicotine:: 12            Years of use:: 44            Max used in a day: 2ppd            Pattern of use:: 2ppd            Last use:: 03/24/18            Longest Abstinence:  : 0  Barbiturates Use: N/A  Other Substance Abuse: n/a  Other Substance Abuse #2:: n/a  Other Substance Abuse #3:: n/a  Caffeine use:: 1 cup of coffe and 2-3 energy drinks  Legal consequences of chemical/alcohol use?   No  Use of OTC medications:   Yes Aleve for pain    Withdrawal Symptoms  Current Withdrawal Symptoms: Irritability, Anxiety, Depression, Tremors, Sweats  Past Withdrawal Symptoms: Anxiety, Irritability, Depression, Cravings, Sweats, Tremors  History of Hallucinations?: 0  History of Withdrawal Seizures?: No  History of DT's?: No  History of Blackouts?: No     Review Of Systems:   Medical Review Of Systems:  General Medical  Current medical conditions:: DM,   When was the last time you consulted with your physician? : unsure  Are you currently working with a psychiatrist? : No  Do you use any alternative therapies?: No  Have you had a blood test to determine if you have Hepatitis?: Yes  Have you had a vaccination for Hepatitis A&B?: No    Current Evaluation:   Presenting Mental Status  Orientation Level: Oriented X4  Memory: No Impairment  Thought Content: normal  Thought Process: normal  Behavior: slowed  Consciousness: Alert  Impulse Control: normal  Perception: normal  Eye Contact: poor  Attitude: cooperative, guarded  Mood: anxious, depressed, irritable  Hopelessness Affects Goals: No  Hopelessness About Future: No  Affect: blunted  Speech: pressured  Concentration: normal  Insight: fair  Judgment: fair  Appearance: disheveled  Appetite: decreased  Weight change?: decreased  Weight Loss (Pounds): 25  Period of Time: 3 motnhs  Energy: decreased  Sleep: difficulty staying asleep  Reliability of Reporter/Patient: fair  Stage of change for patient  to address presenting MH/SA:  : Action  Stage of change choice is evidenced by? : seeking treatment  Sleep  Sleep Pattern: Disturbed/interrupted sleep, Difficulty falling asleep  Average Number of Sleep Hours: 6 Hours  Restful Sleep: Yes  Difficulty Falling Asleep: Yes (Comment)  Difficulty Staying Asleep: Yes (Comment)  Difficulty Arising: Yes (Comment)  Use of Sleep Aids: etoh    Appetite:  Nutritional Status:  Eats fewer than 2 meals per day?: No  Poor appetite 7 days prior to admission?: Yes  Food intolerance/cultural preference?: No  Eating disorder?: No  Impaired chewing or swallowing?: No  Unintentional 10 lb loss/gain in past month: 1  Any special dietary requirement?: No  How would you describe your appetite in the past month?: poor    Physical/Somatic Complaints Pain:  Chronic Pain Issues  Physical Pain: yes  Location of Pain: R leg - foot to knee  Chronic or Acute Pain?: chronic  How have you managed pain?: aleve  Have you contacted a physician about your pain?: yes (comment)(prior surgery x5)    Assessment - Diagnosis - Goals:   Preliminary Diagnosis (DSM IV)  Axis I: ETOH use d/o  Axis II: deferred  Axis III: NIDDM  Axis IV: Housing, Economic, Occupational, Primary support group, Social environment    Treatment Plan/Recommendations:  Admit to [facility] IP under the care of Dr. Richardson Chiquito.    Tyrell Antonio MSN, RN-BC

## 2018-03-24 NOTE — Malnutrition Assessment (Signed)
Tristan Goodwin / [id number]      Malnutrition Documentation    Severe Protein Energy Malnutrition related to chronic alcoholism aeb loss of >10% of usual body weight in 3 months per wt hx in EMR, presumed intake < 75% of estimated energy needs for > 1 month as reported by pt, mild to moderate loss of muscle mass (temples, calf, thigh) per NFPE.      Dietitian intervened and is following.  Concurrent to this note, the diagnosis will be placed on the Problem List.  If physician disagrees with diagnosis, please notify the dietitian at extension [phone].    Shearon Stalls, RD [phone]

## 2018-03-24 NOTE — Consults (Signed)
Nutrition Therapy  Nutrition Assessment  MST=3--unplanned wt loss, decreased po intake.   Patient Information:     Name:Tristan Goodwin   Age: 52 y.o.   Sex: male     MRN: [id number]    Recommendation:     1. Regular diet  2. Chocolate Glucerna tid with meals.   3. Magnesium supplements--serum Magnesium=1.2    Nutrition History:     52 yr old male admitted due to alcohol intoxication/withdrawal. Past hx includes chronic alcoholism, alcoholic liver disease, type 2 DM.     Pt states he's lost about 25# over the past few months. Can't recall what he was eating stating "I can't remember but I wasn't eating much".     Per wt hx in EMR, pt has lost ~12% of usual body wt in 3 months (significant)    Nutrition Focus Physical Exam: Abnormal, slight depression in temporal region.Bones prominent around knee with thigh and posterior calf region thin in appearance.     Nutrition Risk Level: Moderate    Nutrition Diagnosis:     Severe Protein Energy Malnutrition related to chronic alcoholism aeb loss of >10% of usual body weight in 3 months per wt hx in EMR, presumed intake < 75% of estimated energy needs for > 1 month as reported by pt, mild to moderate loss of muscle mass (temples, calf, thigh) per NFPE.       Monitoring:  Evaluation:    PO/EN/PN intake:  Amount of food  and Liquid meal replacement, or supplement intake   Labs:  Electrolyte Profile, Renal Profile and Glucose, casual    GI Profile:  Bowel Function   Nutrition Focused Physical:  Overall appearance, Digestive system and Skin       Assessment Data:     Admission Dx:  Alcohol withdrawal delirium [F10.231]  PMH:  has a past medical history of Anxiety, Depression, and Diabetes mellitus.  PSH:  has a past surgical history that includes reconstructive knee surgery.     Height: 1.829 m (6')   Weight: 71.7 kg (158 lb)   BMI: Body mass index is 21.43 kg/m.   IBW: 81 kg  Weight Monitoring Weight Weight Method BMI (calculated)   12/09/2015 81.6 kg Actual 24.4 kg/m2    03/23/2018 72.2 kg Actual 21.6 kg/m2   03/23/2018 71.668 kg Standing Scale 21.5 kg/m2     Pertinent Meds: Reviewed--includes SSI, multivitamin, vitamin B-1, folic acid, and others.   Pertinent Labs:  Recent Labs   Lab 03/24/18  0244 03/23/18  1636   Sodium 139 139   Potassium 3.8 3.8   Chloride 101 96*   CO2 27.6 22.3   BUN 11 15   Creatinine 0.76* 0.68*   Glucose 285* 133*   Calcium 8.2* 9.0   Magnesium 1.2*  --           Diet Order:  Orders Placed This Encounter   Procedures   . Diet regular   . Glucerna Supplement Quantity: A. One; Frequency: TID with meals Please send only chocolate flavor        GI symptoms:  WDL    Hydration: po  I/O:  Incomplete  Skin: Braden scale=19, Nutrition score=2. No pressure injuries reported at this time.       Food Security Issues:  Yes, homeless--social work has been consulted      Learning Needs:  No education needs at this time    Estimated Needs:  Estimated Energy Needs  Total Energy Estimated Needs: 1800-2160 kcal  Method for Estimating Needs: 25-30 kcal/kg (72 kg)  Estimated Protein Needs  Total Protein Estimated Needs: 86-108 gm  Method for Estimating Needs: 1.2-1.5 gm/kg (72 kg)       Additional Comments:       Shearon Stalls, RD #[id number]  03/24/2018 1:21 PM

## 2018-03-24 NOTE — Progress Notes (Addendum)
NURSE NOTE SUMMARY  [facility] - MED TELEMETRY STEP-DOWN   Patient Name: Tristan Goodwin   Attending Physician: Obie Dredge, *   Today's date:   03/24/2018 LOS: 1 days   Shift Summary:                                                              8:08 AM  Assumed care of patient.  Patient is alert, resting in bed with call bell within reach and has no complaints at this time.  Bed is in the lowest position with wheels locked. Tele and IV are intact.  Whiteboard is updated.  Plt 90, mag 1.2, K+ 3.8.  Bed alarm is on CIWA is 4.     3:57 PM  Patient en route with VMT.        Provider Notifications:      Rapid Response Notifications:  Mobility:      PMP Activity: Step 6 - Walks in Room (03/23/2018 10:23 PM)     Weight tracking:  Family Dynamic:   Last 3 Weights for the past 72 hrs (Last 3 readings):   Weight   03/23/18 2155 71.7 kg (158 lb)   03/23/18 1625 72.2 kg (159 lb 2.8 oz)             Recent Vitals Last Bowel Movement   BP: 131/89 (03/24/2018  8:00 AM)  Heart Rate: (!) 111 (03/24/2018  8:00 AM)  Temp: 98.1 F (36.7 C) (03/24/2018  8:00 AM)  Resp Rate: 17 (03/24/2018  8:00 AM)  Height: 1.829 m (6') (03/23/2018  9:55 PM)  Weight: 71.7 kg (158 lb) (03/23/2018  9:55 PM)  SpO2: 93 % (03/24/2018  8:00 AM)   No data recorded

## 2018-03-24 NOTE — UM Notes (Addendum)
[facility] Utilization Management Review Sheet    Facility :  [facility]    NAME: Tristan Goodwin  MR#: [id number]    CSN#: [id number]    ROOM: 348/348-A AGE: 52 y.o.    ADMIT DATE AND TIME: 03/23/2018  4:27 PM      PATIENT CLASS:    ATTENDING PHYSICIAN: Obie Dredge, *  PAYOR:Payor: [employer] / Plan: Hydrographic surveyor / Product Type: COMMERCIAL /       AUTH #:     DIAGNOSIS:     ICD-10-CM    1. Tachycardia R00.0    2. Alcohol withdrawal delirium F10.231    3. Tremor R25.1    4. Nausea R11.0        HISTORY:   Past Medical History:   Diagnosis Date   . Anxiety    . Depression    . Diabetes mellitus        DATE OF REVIEW: 03/24/2018    VITALS: BP 131/89   Pulse (!) 111   Temp 98.1 F (36.7 C) (Oral)   Resp 17   Ht 1.829 m (6')   Wt 71.7 kg (158 lb)   SpO2 93%   BMI 21.43 kg/m     Active Hospital Problems    Diagnosis   . Alcohol withdrawal         Admission Review  History   52 y.o. male patient who has history of type 2 diabetes mellitus non-insulin-dependent, chronic tobacco abuse and chronic alcoholism who presented today complaining of tremors consistent with alcohol withdrawal.  The patient apparently stopped drinking today and developed tremors which led him to come to ER for evaluation.  VS   T:98.1 F (36.7 C),  BP:119/67, HR:(!) 107, RR:(!) 23, SaO2:90%  Labs  etoh 324,   Alt 225, ast 219, anion gap 24.5,   Assessment/Plan  Alcohol intoxication/withdrawal  --Continue CIWA protocol, continue phenobarbital and Valium  --Seizure precaution, fall precaution  --SBIRT trying to place the patient in inpatient rehab    Alcoholic liver disease  --Check hepatitis panel  --Monitor transaminases    Chronic alcoholism  --Has failed multiple inpatient rehab  --Counseled    Type 2 diabetes mellitus  --Sliding scale coverage for now    Admission Orders  Ativan 0.5mg  x1, phenobarvital 64.8mg  Q8 po, CIWA, consult SW, tele, seizure precaution,

## 2018-03-24 NOTE — Plan of Care (Signed)
Problem: Addiction to alcohol or opioids or other substances AS EVIDENCED BY:  Goal: Patient achieves a safe detoxification and management of withdrawal symptoms  Flowsheets (Taken 03/24/2018 1311)  Patient achieves a safe detoxification and management of withdrawal symptoms: Assess withdrawal signs/symptoms according to identified protocol (e.g. CIWA/COWS); Educate about the importance of reporting dizziness or unsteadiness while ambulating to care providers (e.g. RN, LIP)  Note:   Patient is being monitored for withdrawal using CIWAprotocol.  Seizure precautions are in place.

## 2018-03-24 NOTE — Plan of Care (Signed)
Pt is fine with having bed allarm on.

## 2018-03-25 DIAGNOSIS — F10239 Alcohol dependence with withdrawal, unspecified: Principal | ICD-10-CM

## 2018-03-25 DIAGNOSIS — F1024 Alcohol dependence with alcohol-induced mood disorder: Secondary | ICD-10-CM

## 2018-03-25 DIAGNOSIS — E119 Type 2 diabetes mellitus without complications: Secondary | ICD-10-CM

## 2018-03-25 DIAGNOSIS — I10 Essential (primary) hypertension: Secondary | ICD-10-CM

## 2018-03-25 LAB — GLUCOSE WHOLE BLOOD - POCT
Whole Blood Glucose POCT: 233 mg/dL — ABNORMAL HIGH (ref 70–100)
Whole Blood Glucose POCT: 362 mg/dL — ABNORMAL HIGH (ref 70–100)

## 2018-03-25 LAB — LIPID PANEL
Cholesterol / HDL Ratio: 1.8
Cholesterol: 164 mg/dL (ref 0–199)
HDL: 90 mg/dL (ref 40–9999)
LDL Calculated: 65 mg/dL (ref 0–99)
Triglycerides: 44 mg/dL (ref 34–149)
VLDL Calculated: 9 mg/dL — ABNORMAL LOW (ref 10–40)

## 2018-03-25 LAB — CBC AND DIFFERENTIAL
Absolute NRBC: 0 10*3/uL (ref 0.00–0.00)
Basophils Absolute Automated: 0.03 10*3/uL (ref 0.00–0.08)
Basophils Automated: 0.8 %
Eosinophils Absolute Automated: 0.11 10*3/uL (ref 0.00–0.44)
Eosinophils Automated: 2.8 %
Hematocrit: 36.9 % — ABNORMAL LOW (ref 37.6–49.6)
Hgb: 13.1 g/dL (ref 12.5–17.1)
Immature Granulocytes Absolute: 0.03 10*3/uL (ref 0.00–0.07)
Immature Granulocytes: 0.8 %
Lymphocytes Absolute Automated: 1.24 10*3/uL (ref 0.42–3.22)
Lymphocytes Automated: 31.7 %
MCH: 33.8 pg — ABNORMAL HIGH (ref 25.1–33.5)
MCHC: 35.5 g/dL (ref 31.5–35.8)
MCV: 95.1 fL (ref 78.0–96.0)
MPV: 11.2 fL (ref 8.9–12.5)
Monocytes Absolute Automated: 0.33 10*3/uL (ref 0.21–0.85)
Monocytes: 8.4 %
Neutrophils Absolute: 2.17 10*3/uL (ref 1.10–6.33)
Neutrophils: 55.5 %
Nucleated RBC: 0 /100 WBC (ref 0.0–0.0)
Platelets: 85 10*3/uL — ABNORMAL LOW (ref 142–346)
RBC: 3.88 10*6/uL — ABNORMAL LOW (ref 4.20–5.90)
RDW: 11 % (ref 11–15)
WBC: 3.91 10*3/uL (ref 3.10–9.50)

## 2018-03-25 LAB — BASIC METABOLIC PANEL
BUN: 10 mg/dL (ref 9.0–28.0)
CO2: 30 mEq/L — ABNORMAL HIGH (ref 21–29)
Calcium: 9.4 mg/dL (ref 8.5–10.5)
Chloride: 97 mEq/L — ABNORMAL LOW (ref 100–111)
Creatinine: 0.8 mg/dL (ref 0.5–1.5)
Glucose: 256 mg/dL — ABNORMAL HIGH (ref 70–100)
Potassium: 4.1 mEq/L (ref 3.5–5.1)
Sodium: 137 mEq/L (ref 136–145)

## 2018-03-25 LAB — MAGNESIUM: Magnesium: 1.3 mg/dL — ABNORMAL LOW (ref 1.6–2.6)

## 2018-03-25 LAB — HEMOLYSIS INDEX: Hemolysis Index: 16 (ref 0–18)

## 2018-03-25 LAB — GFR: EGFR: >60

## 2018-03-25 MED ORDER — ATENOLOL 50 MG PO TABS
25.00 mg | ORAL_TABLET | Freq: Two times a day (BID) | ORAL | Status: DC | PRN
Start: 2018-03-25 — End: 2018-03-31
  Administered 2018-03-25: 14:00:00 25 mg via ORAL
  Filled 2018-03-25: qty 1

## 2018-03-25 MED ORDER — CLONIDINE HCL 0.1 MG PO TABS
0.10 mg | ORAL_TABLET | ORAL | Status: DC | PRN
Start: 2018-03-25 — End: 2018-03-31

## 2018-03-25 MED ORDER — METFORMIN HCL 500 MG PO TABS
1000.00 mg | ORAL_TABLET | Freq: Two times a day (BID) | ORAL | Status: DC
Start: 2018-03-26 — End: 2018-03-31
  Administered 2018-03-26 – 2018-03-31 (×11): 1000 mg via ORAL
  Filled 2018-03-25 (×11): qty 2

## 2018-03-25 MED ORDER — OXAZEPAM 15 MG PO CAPS
15.00 mg | ORAL_CAPSULE | ORAL | Status: AC | PRN
Start: 2018-03-27 — End: 2018-03-30
  Administered 2018-03-27 – 2018-03-29 (×5): 15 mg via ORAL
  Filled 2018-03-25 (×5): qty 1

## 2018-03-25 MED ORDER — OXAZEPAM 15 MG PO CAPS
30.00 mg | ORAL_CAPSULE | ORAL | Status: AC | PRN
Start: 2018-03-25 — End: 2018-03-27
  Administered 2018-03-25 – 2018-03-26 (×4): 30 mg via ORAL
  Filled 2018-03-25 (×4): qty 2

## 2018-03-25 NOTE — Plan of Care (Signed)
Problem: Addiction to alcohol or opioids or other substances AS EVIDENCED BY:  Goal: Patient achieves a safe detoxification and management of withdrawal symptoms  Outcome: Progressing  Flowsheets (Taken 03/25/2018 0123)  Patient achieves a safe detoxification and management of withdrawal symptoms: Assess withdrawal signs/symptoms according to identified protocol (e.g. CIWA/COWS); Provide medication teaching including name, dosage, benefits, action, effect and side effects; Assess effectiveness (relief of withdrawal symptoms) and side effects of medication; Educate about health risks associated with withdrawal (e.g. seizures, DTs); Ensure laboratory results are reviewed with the LIP to increase understanding of the medical consequences of addiction; Ensure adequate hydration and nutrition during withdrawal; Educate about the importance of reporting dizziness or unsteadiness while ambulating to care providers (e.g. RN, LIP)  Note:   Patient was irritable and isolative to himself at the beginning of the shift. He became visible around 2200. He was calm and cooperative. He received his first dose of Librium. And tolerated it well. He reported improved in symptoms of withdrawal. He has maintained safewty. No fall. Will continue to monitor him for acute alcohol withdrawal.

## 2018-03-25 NOTE — H&P (Signed)
[facility] - HISTORY and PHYSICAL    Date/Time:  03/25/2018  2:51 PM  Patient Name: Tristan Goodwin, Tristan Goodwin  MRN:  [id number]  Age: 52 y.o.  DOB: [birth year]    Vital Signs:     Vitals:    03/25/18 1404   BP: 104/75   Pulse: (!) 120   Resp: 17   Temp:    SpO2: 100%         Allergies:   No Known Allergies    Chief Complaints:   I was referred from [facility] for help. I cannot stop drinking by myself  Alcohol abuse with high level of BAL prior to his transfer from [facility]     Current Health Problems:   Mr. Tristan Goodwin is 52 year old male with HX of DM type II and alcohol abuse severe, associated with alcohol withdrawals symptoms, presents to this unit transferred from [facility] with C/C of  tremors consistent with alcohol withdrawal. He drinks  half a gallon of vodka. He realized that he has been drinking too much and decided to stop drinking on the day of admission to [facility]. BAL was high (325) and he was tachycardic. He is given Valium and Phenobarb, after he got feeling improved, patient referred to this center accepted by the writer for treatment. He has had anxiety, nausea, vomiting, shakiness, headache, and agitated. Denies S/H ideations. No seizures and A/H reported.     Review of Systems:   A comprehensive review of systems was negative except for:  Constitutional: positive for fatigue, malaise, night sweats and sweats  Eyes: Negative  Ears, nose, mouth, throat, and face: Negative  Respiratory: Negative  Cardiovascular: Negative  Gastrointestinal: Negative  Integument/breast: Negative  Hematologic/lymphatic: Negative  Musculoskeletal: Negative  Neurological: positive for dizziness, tremors and weakness  Behavioral/Psych: positive for anxiety, depression and sleep disturbance  Endocrine: Negative    History of Past Illnesses (Serious illnesses, injuries, surgeries, hospitalizations, including psychiatric admissions):     Past Medical History:   Diagnosis Date   .  Anxiety    . Depression    . Diabetes mellitus    . Severe protein-calorie malnutrition 03/24/2018    Severe Protein Energy Malnutrition related to chronic alcoholism aeb loss of >10% of usual body weight in 3 months per wt hx in EMR, presumed intake < 75% of estimated energy needs for > 1 month as reported by pt, mild to moderate loss of muscle mass (temples, calf, thigh) per NFPE.  RD has intervened and is following.        Family Medical History:     Family History   Problem Relation Age of Onset   . Diabetes Mother    . Diabetes Father    . Cancer Father        Social History:     Social History     Substance and Sexual Activity   Alcohol Use Yes    Comment: "half a gallon of vodka" every other day. States "a couple of shots" at 1300 today     Social History     Substance and Sexual Activity   Drug Use No     Social History     Tobacco Use   Smoking Status Current Every Day Smoker   . Packs/day: 2.00   . Years: 40.00   . Pack years: 80.00   . Types: Cigarettes   Smokeless Tobacco Never Used         Physical Exam:  General appearance - oriented to person, place, and time   Mental status -  Anxious, guarded, depressed  Eyes - pupils equal and reactive, extraocular eye movements intact  Ears - bilateral TM's and external ear canals normal  Nose - no septal perforation  Mouth - mucous membranes moist, pharynx normal without lesions  Neck - supple, no significant adenopathy  Chest - clear to auscultation, no wheezes, rales or rhonchi, symmetric air entry  Heart - normal rate, regular rhythm, normal S1, S2, no murmurs, rubs, clicks or gallops  Abdomen - soft, nontender, nondistended, no masses or organomegaly  GU Male - deferred to PCP  Rectal - deferred to PCP.  Neurological - alert, oriented, normal speech, no focal findings or movement disorder noted, screening mental status exam normal, neck supple without rigidity, cranial nerves II through XII intact, motor and sensory grossly normal bilaterally, normal  muscle tone, no tremors, strength 5/5  Musculoskeletal - no joint tenderness, deformity or swelling  Extremities - peripheral pulses normal, no pedal edema, no clubbing or cyanosis  Skin - normal     Mental Status Exam:   Appearance: No apparent distress  Behavior/relationship to examiner/demeanor:  Guarded  Motor activity/EPS:  Normal  Gait:   Normal  Mood (subjective report):   anxious  Affect (objective appearance):  Appropriate/mood-congruent  Fund of Knowledge/Intelligence:   Average  Abstraction:   Normal  Insight:   Fair  Judgment:   Fair      Clinical Diagnosis:    Alcohol abuse disorder, severe   Alcohol withdrawals   Alcohol dependence   DM type II   Substance induced mood disorder   HTN    This patient has been informed of their individual treatment plan.  This includes an explanation of the action of all prescribed psychoactive medications.    List of Prescribed Psychoactive Medications (Antidepressants, Mood Stabilizers, Antipsychotics, Antianxiety, Stimulants):   Serax  Trazodone     The benefits, potential side effects, risks, and possible drug interactions were explained to this patient who indicated that he understood and agrees to the plan of care.   All patient questions were answered.    Plan:  Admit to [facility] inpatient unit   Start with Serax detox protocol according to CIWA  PRN medication for sleep, anxiety and pain, if exists   Substance Abuse Counseling  IOP Triage  Discuss with treatment team  Review labs with patient  COUNSEL ON SMOKING CESSATION  OFFER Rx TO STOP SMOKING    I certify that inpatient services are medically necessary for this patient due to Alcohol and Withdrawal and the resultant risk to self.Marland Kitchen Please see H&P and MD progress notes for additional information about the patient's course of treatment. The patient continues to need active treatment furnished by and requiring the supervision of inpatient facility personnel. I certify that inpatient services are medically  necessary for treatment which could reasonably be expected to improve the patient's condition. Please see H&P and MD progress notes for additional information about the patient's course of treatment. For Medicare patients, services to be provided in accordance with 412.3 and expected LOS to be greater than 2 midnights for Medicare patients.     Signed by: Micah Flesher, MD   03/25/2018 2:51 PM
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
## 2018-03-25 NOTE — Progress Notes (Signed)
Montey is being assessed per CIWA/COWS protocol. He is AOX4. Denies of SI/HI/AVH. Pt presented calm and cooperative with Clinical research associate. During start of shift he complaints of feeling weak. Upon assessment his gait was unsteady and uncoordinated. Writer reviewed the fall protocol and educated to use call light. Writer called MD and change to serax protocol. librium protocol was discontinued per MD. Pt.'s HR was 118 at 0805, reassessed at 08:36 HR was 101.  Later again his HR was elevated to 120 at 14:04. administered serax 30 mg PO for elevated CIWA score, atenolol 25 mg for elevated HR of 120 with good affects. Pt. Reported having diarrhea, given imodium. Encouraged hydration. Safety maintained. Will give report to oncoming nurse.

## 2018-03-25 NOTE — Progress Notes (Signed)
BPS Note:  This counselor met with Pt 1:1 to complete BPS interview. Pt presented as depressed, but cooperative, evidenced by Pt's sad/flat affect. Pt was oriented to place, day, time and circumstances for being in treatment. Pt reported the precipitating event for seeking treatment as "I'm tired of it," which he described as the drinking. Pt denies current or past SI/HI plans or intents. Pts described his current living arrangements as homeless as of 2  weeks ago. Pt reported prior to this he was living alone in his own apartment.  Patient's level of family/friend support is limited as his relationships with family and friends are strained due to his alcohol use. Pt reported he is currently unemployed; Pt reported he lost his job 2 weeks ago due to his alcohol use. Pt reports the following aftercare plan: Pt reported he is unsure of his plan for aftercare due to homelessness and unsure of where he will be living after completion of detox treatment. Pt presents in the Contemplation Stage of Change as evidenced by Pt's willingness to come into treatment.  Pt was assessed and provided with appropriate treatment materials including Step One and the My Personal Journal Workbook. Pt's treatment plan will focus on aftercare planning, daily group attendance, attending 12-step meetings, 1:1 counseling sessions and collaborative Rounding.        Daily Note:    Affect/Mood:  Depressed    Thought Process:  Logical    Thought Content:  Within Normal Limits    Interpersonal:  Discussed Issues and Attentive    Treatment Plan Goal Addressed: Patient's recovery goal in his/her own words:       Intervention Used and Patient Response:  Assist to identify goals for treatment based on individual needs and strengths    Motivational Interviewing skills (reflective listening, attentive, open ended questions) were used during today's counseling session. Pt presented as depressed, sad and tired; he described his mood a "pretty good". Pt  reported he does not have a sponsor and that he has never attended a 12-step recovery meeting. Pt reported he is an alcoholic, that he has been drinking for years and drinking has been "the story of my life." Pt reported he wants to be sober. Pt reported he was grateful for being here. Pt was encouraged to attend the daily scheduled groups and the 12-step recovery meetings. Pt was also encouraged to interact with other peers on the unit appropriately in support of her treatment/recovery process.         Patient Participation in Groups: Pt did not attend the scheduled groups today.   Pt was observed in bed resting during the day with the exception of meals times where he was observed eating with other members of the community.       Family Interview Note:   Per Pt's self-report, he is currently homeless and does not have a relationship with any of his family members.

## 2018-03-26 LAB — HEMOLYSIS INDEX: Hemolysis Index: 17 (ref 0–18)

## 2018-03-26 LAB — GLUCOSE WHOLE BLOOD - POCT
Whole Blood Glucose POCT: 255 mg/dL — ABNORMAL HIGH (ref 70–100)
Whole Blood Glucose POCT: 318 mg/dL — ABNORMAL HIGH (ref 70–100)
Whole Blood Glucose POCT: 314 mg/dL — ABNORMAL HIGH (ref 70–100)

## 2018-03-26 LAB — MAGNESIUM: Magnesium: 1.2 mg/dL — ABNORMAL LOW (ref 1.6–2.6)

## 2018-03-26 MED ORDER — GLUCAGON 1 MG IJ SOLR (WRAP)
1.00 mg | INTRAMUSCULAR | Status: DC | PRN
Start: 2018-03-26 — End: 2018-03-31

## 2018-03-26 MED ORDER — INSULIN REGULAR HUMAN 100 UNIT/ML IJ SOLN
8.00 [IU] | Freq: Once | INTRAMUSCULAR | Status: AC
Start: 2018-03-26 — End: 2018-03-26
  Administered 2018-03-26: 17:00:00 8 [IU] via SUBCUTANEOUS
  Filled 2018-03-26: qty 24

## 2018-03-26 MED ORDER — MAGNESIUM OXIDE 400 MG TABS (WRAP)
400.00 mg | ORAL_TABLET | Freq: Three times a day (TID) | ORAL | Status: AC
Start: 2018-03-26 — End: 2018-03-28
  Administered 2018-03-26 – 2018-03-28 (×6): 400 mg via ORAL
  Filled 2018-03-26 (×6): qty 1

## 2018-03-26 MED ORDER — INSULIN REGULAR HUMAN 100 UNIT/ML IJ SOLN
8.00 [IU] | Freq: Once | INTRAMUSCULAR | Status: DC
Start: 2018-03-26 — End: 2018-03-26

## 2018-03-26 MED ORDER — GLUCOSE 40 % PO GEL
15.00 g | ORAL | Status: DC | PRN
Start: 2018-03-26 — End: 2018-03-31

## 2018-03-26 MED ORDER — DEXTROSE 50 % IV SOLN
12.50 g | INTRAVENOUS | Status: DC | PRN
Start: 2018-03-26 — End: 2018-03-31

## 2018-03-26 NOTE — Progress Notes (Signed)
Daily Note:    Affect/Mood:  Depressed    Thought Process:  Logical    Thought Content:  Within Normal Limits    Interpersonal:  Discussed Issues, Attentive, Guarded and Isolative    Treatment Plan Goal Addressed: (directly from patient's treatment plan)    Tristan Goodwin will be an active participant in his treatment and attend unit activities.  Tristan Goodwin reports that he is open to aftercare options that are available.    Intervention Used and Patient Response: (directly from patient's treatment plan)    Educate about the characteristics of alcohol/substance abuse/dependency  Assist patient to identify coping strategies to use as alternatives to alcohol/substance abuse/dependency  Encourage and monitor participation in all unit activities  Encourage participation in groups and 12 step meetings while on the unit  Provide patient with a task and an opportunity to present during group   Review healthy diversion activities that promote abstinence from alcohol/substances  Review community resources and social supports that promote sober living  Review sober recovery transitional plan  Provide referral resources and assist in determining appropriate continuing care    The patient presented in a depressed mood with flat affect AEB guarded posture, averted and avoidant gaze, low tone and demeanor, poor concentration, and observed hopelessness and isolation.      The patient reported that "I'm doing okay, it's my 2nd day here, everything is fine, but I'd like to have a cigarette."  The patient reported previously completing treatment between the months of January and March at [facility] in [place], which he reported as "being helpful until I got home."  The patient reported only maintaining two days of sobriety before "memories" led to his relapse.  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  The counselor reported that he would check back in with the patient to see if there was anything that he could do to help.    Patient Participation in Groups (provide narrative of attendance and responses from the patient):    The patient has had a poor participation level in the on-unit groups, not attending any of the on-unit groups - [employer], Feelings Group, the on-unit AA meeting, and Task Group.  The patient was observed in his room for the majority of the day and, during meal times, he sat away from the other patients, continuing to isolate.  The counselor recommended that he engage in the milieu so that he can receive support from the other patients as well as have an outlet, if needed.

## 2018-03-26 NOTE — Plan of Care (Signed)
Shadrack was assessed during 1600 for CIWA. Denied w/ds. Scheduled medications given.  BS was checked before dinner and it was 318. MD gave the order for regular insulin 8 units.   His diet order was changed to low carb and cafeteria was called.  Education was given on restricted carb diet.  Will continue to monitor and report to coming shift.

## 2018-03-26 NOTE — Progress Notes (Signed)
Tristan Goodwin is being assessed on alcohol withdrawal symptoms. He did c/o of being weak with gait. Writer emphasized to use call light and walker while ambulating. He has been compliant this shift and expressed gratitude of the care he received from staff. At He is AOX4. Denies of SI/HI/AVH. Administered Imodium 2 mg x2  PO. After administration of antidiarrheal pt. Reports one episode soft stool so far. Writer notified MD about pt's soft stool episode and voiced concerned about C-diff. Lab was not order by MD.  Pt. Reported  good appetite, no cramps, no watery diarrhea or no foul smell. PRN medication given per sign and symptoms. Safety maintained. Hydration was encouraged

## 2018-03-26 NOTE — Progress Notes (Signed)
Tristan Goodwin is being monitored Q4 for signs and symptoms of withdrawal. Medicated as needed for withdrawal symptoms.  C/o diarrhea, imodium given with some relief.  He is unsteady on his feet and weak.  Discussed that he is a falls risk because of his unsteadiness, weakness, and that his platelets are = 85.  Encouraged him to use the walker when ambulating independently and to use the call bell if he is unable to get out of bed.  He has been compliant with the call bell and using the walker so far.  Safety reinforced, plan of care discussed and hydration encouraged.  Pt verbalized understanding.  Safety maintained.  Will continue poc and to monitor

## 2018-03-26 NOTE — Progress Notes (Signed)
[facility] - MD PROGRESS NOTE    Date/Time:  03/26/2018  12:07 PM  Patient Name: Tristan Goodwin, Tristan Goodwin  MRN:  [id number]  Age: 52 y.o.  DOB: 1966-03-09    Vital Signs:     Vitals:    03/26/18 1140   BP: 119/75   Pulse: 86   Resp: 17   Temp:    SpO2: 99%         Allergies:   No Known Allergies    Chief Complaints:   Patient seen today, chart reviewed and discussed with staff, reports unsteady and weak, difficulty to maintain equilibrium while walking (unsteady), advised to use the walker and use the bell call the staff for assistance. Diarrhea has subsided with PRN anti diarrhea medication. Today, his physical condition is better than yesterday. Has mild anxiety and tremor. Vitals are low and decided to hold Serax protocol.     Current Health Problems:   Mr. Travarus is 52 year old male with HX of DM type II and alcohol abuse severe, associated with alcohol withdrawals symptoms, presents to this unit transferred from [facility] with C/C of  tremors consistent with alcohol withdrawal. He drinks  half a gallon of vodka. He realized that he has been drinking too much and decided to stop drinking on the day of admission to [facility]. BAL was high (325) and he was tachycardic. He is given Valium and Phenobarb, after he got feeling improved, patient referred to this center accepted by the writer for treatment. He has had anxiety, nausea, vomiting, shakiness, headache, and agitated. Denies S/H ideations. No seizures and A/H reported.     Review of Systems:   A comprehensive review of systems was negative except for:  Constitutional: positive for fatigue, malaise, night sweats and sweats  Eyes: Negative  Ears, nose, mouth, throat, and face: Negative  Respiratory: Negative  Cardiovascular: Negative  Gastrointestinal: Negative  Integument/breast: Negative  Hematologic/lymphatic: Negative  Musculoskeletal: Negative  Neurological: positive for dizziness, tremors and weakness  Behavioral/Psych: positive  for anxiety, depression and sleep disturbance  Endocrine: Negative    History of Past Illnesses (Serious illnesses, injuries, surgeries, hospitalizations, including psychiatric admissions):     Past Medical History:   Diagnosis Date   . Anxiety    . Depression    . Diabetes mellitus    . Severe protein-calorie malnutrition 03/24/2018    Severe Protein Energy Malnutrition related to chronic alcoholism aeb loss of >10% of usual body weight in 3 months per wt hx in EMR, presumed intake < 75% of estimated energy needs for > 1 month as reported by pt, mild to moderate loss of muscle mass (temples, calf, thigh) per NFPE.  RD has intervened and is following.        Family Medical History:     Family History   Problem Relation Age of Onset   . Diabetes Mother    . Diabetes Father    . Cancer Father        Social History:     Social History     Substance and Sexual Activity   Alcohol Use Yes    Comment: "half a gallon of vodka" every other day. States "a couple of shots" at 1300 today     Social History     Substance and Sexual Activity   Drug Use No     Social History     Tobacco Use   Smoking Status Current Every Day Smoker   . Packs/day: 2.00   .  Years: 40.00   . Pack years: 80.00   . Types: Cigarettes   Smokeless Tobacco Never Used         Physical Exam:     General appearance - oriented to person, place, and time   Mental status -  Anxious, guarded, depressed  Eyes - pupils equal and reactive, extraocular eye movements intact  Ears - bilateral TM's and external ear canals normal  Nose - no septal perforation  Mouth - mucous membranes moist, pharynx normal without lesions  Neck - supple, no significant adenopathy  Chest - clear to auscultation, no wheezes, rales or rhonchi, symmetric air entry  Heart - normal rate, regular rhythm, normal S1, S2, no murmurs, rubs, clicks or gallops  Abdomen - soft, nontender, nondistended, no masses or organomegaly  GU Male - deferred to PCP  Rectal - deferred to PCP.  Neurological -  alert, oriented, normal speech, no focal findings or movement disorder noted, screening mental status exam normal, neck supple without rigidity, cranial nerves II through XII intact, motor and sensory grossly normal bilaterally, normal muscle tone, no tremors, strength 5/5  Musculoskeletal - no joint tenderness, deformity or swelling  Extremities - peripheral pulses normal, no pedal edema, no clubbing or cyanosis  Skin - normal     Mental Status Exam:   Appearance: No apparent distress  Behavior/relationship to examiner/demeanor:  Guarded  Motor activity/EPS:  Normal  Gait:   Normal  Mood (subjective report):   anxious  Affect (objective appearance):  Appropriate/mood-congruent  Fund of Knowledge/Intelligence:   Average  Abstraction:   Normal  Insight:   Fair  Judgment:   Fair      Clinical Diagnosis:    Alcohol abuse disorder, severe   Alcohol withdrawals   Alcohol dependence   DM type II   Substance induced mood disorder   HTN    This patient has been informed of their individual treatment plan.  This includes an explanation of the action of all prescribed psychoactive medications.    List of Prescribed Psychoactive Medications (Antidepressants, Mood Stabilizers, Antipsychotics, Antianxiety, Stimulants):   Serax  Trazodone     The benefits, potential side effects, risks, and possible drug interactions were explained to this patient who indicated that he understood and agrees to the plan of care.   All patient questions were answered.    Plan:  Admit to [facility] inpatient unit   Continue detox protocol according to CIWA  Please Hold Serax if Hypotension and call MD  PRN medication for sleep, anxiety and pain, if exists   Substance Abuse Counseling  IOP Triage  Discuss with treatment team  Review labs with patient  COUNSEL ON SMOKING CESSATION  OFFER Rx TO STOP SMOKING    I certify that inpatient services are medically necessary for this patient due to Alcohol and Withdrawal and the resultant risk to self.Marland Kitchen Please  see H&P and MD progress notes for additional information about the patient's course of treatment. The patient continues to need active treatment furnished by and requiring the supervision of inpatient facility personnel. I certify that inpatient services are medically necessary for treatment which could reasonably be expected to improve the patient's condition. Please see H&P and MD progress notes for additional information about the patient's course of treatment. For Medicare patients, services to be provided in accordance with 412.3 and expected LOS to be greater than 2 midnights for Medicare patients.     Signed by: Micah Flesher, MD   03/26/2018 12:07 PM

## 2018-03-27 DIAGNOSIS — K709 Alcoholic liver disease, unspecified: Secondary | ICD-10-CM

## 2018-03-27 DIAGNOSIS — F419 Anxiety disorder, unspecified: Secondary | ICD-10-CM

## 2018-03-27 DIAGNOSIS — R270 Ataxia, unspecified: Secondary | ICD-10-CM

## 2018-03-27 LAB — HEMOLYSIS INDEX: Hemolysis Index: 10 (ref 0–18)

## 2018-03-27 LAB — MAGNESIUM: Magnesium: 1.4 mg/dL — ABNORMAL LOW (ref 1.6–2.6)

## 2018-03-27 LAB — GLUCOSE WHOLE BLOOD - POCT
Whole Blood Glucose POCT: 258 mg/dL — ABNORMAL HIGH (ref 70–100)
Whole Blood Glucose POCT: 369 mg/dL — ABNORMAL HIGH (ref 70–100)

## 2018-03-27 MED ORDER — TRAZODONE HCL 150 MG PO TABS
75.00 mg | ORAL_TABLET | Freq: Every evening | ORAL | 0 refills | Status: AC | PRN
Start: 2018-03-27 — End: ?

## 2018-03-27 MED ORDER — FAMOTIDINE 20 MG PO TABS
20.00 mg | ORAL_TABLET | Freq: Two times a day (BID) | ORAL | 0 refills | Status: AC
Start: 2018-03-27 — End: ?

## 2018-03-27 MED ORDER — METFORMIN HCL 1000 MG PO TABS
1000.00 mg | ORAL_TABLET | Freq: Two times a day (BID) | ORAL | 0 refills | Status: AC
Start: 2018-03-27 — End: ?

## 2018-03-27 MED ORDER — HYDROXYZINE PAMOATE 25 MG PO CAPS
25.00 mg | ORAL_CAPSULE | Freq: Four times a day (QID) | ORAL | 0 refills | Status: AC | PRN
Start: 2018-03-27 — End: ?

## 2018-03-27 MED ORDER — NICOTINE 21 MG/24HR TD PT24
1.00 | MEDICATED_PATCH | Freq: Every day | TRANSDERMAL | 0 refills | Status: AC
Start: 2018-03-28 — End: ?

## 2018-03-27 NOTE — Progress Notes (Signed)
PROGRESS NOTE    Date Time: 03/27/18 8:21 AM  Patient Name: Tristan Goodwin, Tristan Goodwin      Chief Complaint:   Feels weak  Still using walker  Noted blood sugar  Still has tremors  Sweats          Assessment:   Alcohol Use Disorder: SEVRERE  Alcohol Withdrawal  Anxiety  Hypomagnesemia  Alcohol Liver Disease  Ataxia  T2DM  HTN  Thrombocytopenia        Plan:   Continue with Detoxification  Serax Protocol  SA Counseling  Discussed with treatment team  Review labs with patients  IOP Triage  Discharge planning  Fall Precautions           Medications:     Current Facility-Administered Medications   Medication Dose Route Frequency   . famotidine  20 mg Oral BID   . folic acid  1 mg Oral Daily   . magnesium oxide  400 mg Oral TID   . metFORMIN  1,000 mg Oral BID Meals   . multivitamin  1 tablet Oral Daily   . nicotine  1 patch Transdermal Daily   . thiamine  100 mg Intramuscular Once   . thiamine  100 mg Oral Daily   . tuberculin  0.1 mL Intradermal Once   . tuberculin  0.1 mL Intradermal Once       Review of Systems:   A comprehensive review of systems was negative except for:   Respiratory: Negative  Cardiovascular: Negative  Gastrointestinal: Negative  Genitourinary: Negative  Musculoskeletal: Negative  Neurological: positive for tremors    Physical Exam:     Vitals:    03/27/18 0752   BP: 94/59   Pulse: 84   Resp: 16   Temp: 97.9 F (36.6 C)   SpO2: 98%       General appearance - oriented to person, place, and time   Mental status - anxious   Eyes - pupils equal and reactive, extraocular eye movements intact  Ears - bilateral TM's and external ear canals normal  Nose - no septal perforation  Mouth - mucous membranes moist, pharynx normal without lesions  Neck - supple, no significant adenopathy  Chest - clear to auscultation, no wheezes, rales or rhonchi, symmetric air entry  Heart - normal rate, regular rhythm, normal S1, S2, no murmurs, rubs, clicks or gallops  Abdomen - soft, nontender, nondistended, no masses or  organomegaly  Rectal - deferred to PCP.  Neurological - alert, oriented, normal speech, cranial nerves II through XII intact, mild tremors, gait weak with walker  Musculoskeletal - no joint tenderness, deformity or swelling  Extremities - peripheral pulses normal, no pedal edema, no clubbing or cyanosis  Skin - plethoric, flushed      Labs:     Results     Procedure Component Value Units Date/Time    Magnesium [[id number]]  (Abnormal) Collected:  03/27/18 0549    Specimen:  Blood Updated:  03/27/18 0758     Magnesium 1.4 mg/dL     Hemolysis index [[id number]] Collected:  03/27/18 0549     Updated:  03/27/18 0758     Hemolysis Index 10    Glucose Whole Blood - POCT [[id number]]  (Abnormal) Collected:  03/27/18 0610     Updated:  03/27/18 0612     POCT - Glucose Whole blood 258 mg/dL     Glucose Whole Blood - POCT [[id number]]  (Abnormal) Collected:  03/26/18 1909     Updated:  03/26/18 1912  POCT - Glucose Whole blood 314 mg/dL     Glucose Whole Blood - POCT [[id number]]  (Abnormal) Collected:  03/26/18 1538     Updated:  03/26/18 1542     POCT - Glucose Whole blood 318 mg/dL     Magnesium [[id number]]  (Abnormal) Collected:  03/26/18 0710    Specimen:  Blood Updated:  03/26/18 0832     Magnesium 1.2 mg/dL     Hemolysis index [[id number]] Collected:  03/26/18 0710     Updated:  03/26/18 0832     Hemolysis Index 17              Signed by: Nicholaus Bloom, MD      I certify that inpatient services are medically necessary for this patient due to Alcohol Withdrawal and the resultant risk to self.Marland Kitchen Please see H&P and MD progress notes for additional information about the patient's course of treatment. The patient continues to need active treatment furnished by and requiring the supervision of inpatient facility personnel. I certify that inpatient services are medically necessary for treatment which could reasonably be expected to improve the patient's condition. Please see H&P and MD progress notes for additional information  about the patient's course of treatment. For Medicare patients, services to be provided in accordance with 412.3 and expected LOS to be greater than 2 midnights for Medicare patients.

## 2018-03-27 NOTE — Plan of Care (Signed)
Case Management Note: Pt spoke with SW about discharge planning. Pt was under the impression that his stay at detox would last multiple months but was told by another hospital staff member that he could be discharging tomorrow. The SW informed the Pt that detox only lasts for 3-7 days and that is why she was discussing aftercare with him earlier in the day. SW encouraged Pt to consider transitioning to a residential program and Pt signed a release for [facility]. SW faxed Pt's medical records to [facility] at 3:10 PM.

## 2018-03-27 NOTE — Plan of Care (Signed)
Problem: Addiction to alcohol or opioids or other substances AS EVIDENCED BY:  Goal: Patient achieves a safe detoxification and management of withdrawal symptoms  Outcome: Progressing  Flowsheets (Taken 03/27/2018 0558)  Patient achieves a safe detoxification and management of withdrawal symptoms: Assess withdrawal signs/symptoms according to identified protocol (e.g. CIWA/COWS); Provide medication teaching including name, dosage, benefits, action, effect and side effects; Assess effectiveness (relief of withdrawal symptoms) and side effects of medication; Educate about health risks associated with withdrawal (e.g. seizures, DTs); Ensure adequate hydration and nutrition during withdrawal; Educate about the importance of reporting dizziness or unsteadiness while ambulating to care providers (e.g. RN, LIP)  Note:   Patient's assessment done at the beginning of the shift: Remains visible in the mellieu, interacting and socializing with selected peers.  Continue to utilized the walker for ambulation without any incidents.  Able to make needs known and was medicated according to verbalized symptoms per Protocol and according to MD's order.  Remains on Serax PRN Protocol and utilized PRN Vistaril 25 mg at 2116, Motrin 600 mg at 2116 and Imodium 2 mg at 2116.  Patient also utilized Serax 30 mg and Trazodone 75 mg for insomnia with a positive effect.  Has since been comfortably sleeping without  any incidents.  Will continue to monitor.  Staff monitoring for safety and support is ongoing and Patient is able to maintain safety.

## 2018-03-27 NOTE — Plan of Care (Signed)
Nursing Note: Pt assessed for symptoms of alcohol withdrawal X 3, CIWA 3, 3 & 10. Pt exhibited tremor, sweats, anxiety. He received scheduled medications this morning, and Serax 15 mg once this afternoon @ 16:16 with good effect. Pt did not express interest in aftercare options that were presented to him. However, he did agree to consider placement in a residential program after case management explained to him that detox is only for 5 - 7 days, pt claimed he was told his stay at [facility] could be for several months. He has discharge order for tomorrow and response pending from [facility].

## 2018-03-27 NOTE — Progress Notes (Addendum)
Daily Note:    Affect/Mood:  Appropriate    Thought Process:  Logical    Thought Content:  Within Normal Limits    Interpersonal:  Attentive    Treatment Plan Goal Addressed:  Goal: Patient educated about the disease of addiction and recovery and identifies long-term goal.     Intervention Used and Patient Response:  Encourage participation in groups and 12 step meetings while on the unit. Patient reported that he was feeling "a little better than when he got here". Writer and patient discussed patient's circumstances. Patient shared that his stay at a [facility] facility had been helpful but that he could only remain sober for a couple of days after leaving. Patient is homeless and did not seem to be interested in any aftercare plan at this time. Writer presented/explained/informed patient of main goals of detoxification process and as well as possible aftercare options ([facility] day [place], IOP, residential treatment and 12 steps involvement). Patient was encouraged to discuss aftercare options with SW. Patient was also encouraged to attend groups and meetings organised on the unit.    Patient Participation in Groups:  Patient did not participate in any groups of meetings so far today.

## 2018-03-27 NOTE — Plan of Care (Signed)
Problem: Addiction to alcohol or opioids or other substances AS EVIDENCED BY:  Description  Tristan Goodwin reports drinking 1/2 gallon of vodka every 2-3 days for the past 15 years.   Goal: Patient develops a discharge plan  Description  Tristan Goodwin reports that he is open to aftercare options that are available.    Interventions:  1. Review healthy diversion activities that promote abstinence from alcohol/substances  2. Review community resources and social supports that promote sober living  3. Review sober recovery transitional plan  4. Provide referral resources and assist in determining appropriate continuing care  5. Ensure follow up appointments for continued care are made   Outcome: Progressing     Case Management Note: SW and SW Intern met with Pt in Pt's room to discuss aftercare. Pt has previously expressed not being interested in aftercare and Pt expressed the same feelings again. SW presented Pt with [facility] and IOP options in the event the Pt changes his mind. Pt reported that he understood.

## 2018-03-28 LAB — GLUCOSE WHOLE BLOOD - POCT
Whole Blood Glucose POCT: 210 mg/dL — ABNORMAL HIGH (ref 70–100)
Whole Blood Glucose POCT: 275 mg/dL — ABNORMAL HIGH (ref 70–100)

## 2018-03-28 NOTE — Plan of Care (Signed)
Problem: Addiction to alcohol or opioids or other substances AS EVIDENCED BY:  Goal: Patient educated about the disease of addiction and recovery and identifies long-term goal  Outcome: Progressing   Daily Note:    Affect/Mood:  Anxious and Depressed    Thought Process:  Logical and Goal Directed    Thought Content:  Within Normal Limits    Interpersonal:  Discussed Issues and Attentive; Cooperative, Open and Engaged    Treatment Plan Goal Addressed:   Patient educated about the disease of addiction and recovery and identifies long-term goal    Intervention Used and Patient Response:   2. Assist patient to identify coping strategies to use as alternatives to alcohol/substance abuse/dependency  3. Encourage and monitor participation in all unit activities  4. Encourage participation in groups and 12 step meetings while on the unit  Met with pt 1:1 to assess pt's progress in treatment and needs in recovery. Pt reported he was doing "okay" today, although was frustrated at not being able to sleep well. Pt was concerned that even with medication assistance, he had difficulty sleeping last night. Counselor and pt discussed acute and post-acute withdrawal, how sleep patterns may be impacted due to such, and proper sleep hygiene. Pt also reported feeling anxious, rating it as "3 out of 10". Pt was primarily worried about all the unknowns in his next steps; counselor processed feelings with pt, discussed pt's practical next steps of calling numbers given to him by SW staff, reassured pt of staff support. Counselor encouraged pt to attend groups today as well, to keep him mindfully focused on treatment instead of worrying about things he can't control. Counselor provided emotional support and encouraged pt to reach out to staff as needed.    Patient Participation in Groups:  Pt attended groups on and off today. In morning [employer], pt reported feeling "physically not too bad; emotionally tired". Pt stated he is  grateful for "being alive".

## 2018-03-28 NOTE — Plan of Care (Signed)
Problem: Addiction to alcohol or opioids or other substances AS EVIDENCED BY:  Goal: Patient achieves a safe detoxification and management of withdrawal symptoms  Flowsheets (Taken 03/28/2018 0420)  Patient achieves a safe detoxification and management of withdrawal symptoms: Assess withdrawal signs/symptoms according to identified protocol (e.g. CIWA/COWS); Provide medication teaching including name, dosage, benefits, action, effect and side effects; Assess effectiveness (relief of withdrawal symptoms) and side effects of medication; Educate about health risks associated with withdrawal (e.g. seizures, DTs); Ensure adequate hydration and nutrition during withdrawal; Educate about the importance of reporting dizziness or unsteadiness while ambulating to care providers (e.g. RN, LIP)  Note:   Patient's assessment done at the beginning of the shift: Remains visible in the mellieu, interacting and and socializing with selected peers, watching the [employer].  Able to make needs known and was medicated according to verbalized symptoms per Protocol and according to MD's order.    Patient remains on Serax PRN Protocol and utilized Serax 15 mg at 2009, Motrin 600 mg at 2009, Vistaril 25 mg at 2229, and Trazodone 75 mg at 2230 for insomnia with a positive effect.  Has since been comfortably sleeping without any incidents.  Will continue to monitor. Staff monitoring for safety and support is ongoing and Patient is able to maintain safety.

## 2018-03-28 NOTE — Plan of Care (Signed)
Case Management Note: SW called [facility] [phone]) in [place] to retrieve community resources for the Pt around [employer], [place]. SW was given the phone number for the [facility] [phone] at [address], [place], [place] [address]) where the Pt can apply for Medicaid. SW was also given phone numbers for substance use treatment services, [facility] [phone]), where the Pt may be able to make an appointment, and [facility] [phone]), where the Pt would need to walk-in and be assessed to receive services. This information will be given to the Pt.

## 2018-03-28 NOTE — Progress Notes (Signed)
PROGRESS NOTE    Date Time: 03/28/18 11:32 AM  Patient Name: Tristan Goodwin      Chief Complaint:   Doing better  Ambulates slowly without cane  Planning on aftercare  Still tremolos        Assessment:   Alcohol Use Disorder: SEVRERE  Alcohol Withdrawal  Anxiety  Hypomagnesemia  Alcohol Liver Disease  Ataxia  T2DM  HTN  Thrombocytopenia      Plan:   Continue with Detoxification  Serax Protocol  SA Counseling  Discussed with treatment team  Review labs with patients  IOP Triage  Discharge planning: TBD  Fall Precautions           Medications:     Current Facility-Administered Medications   Medication Dose Route Frequency   . famotidine  20 mg Oral BID   . folic acid  1 mg Oral Daily   . metFORMIN  1,000 mg Oral BID Meals   . multivitamin  1 tablet Oral Daily   . nicotine  1 patch Transdermal Daily   . thiamine  100 mg Intramuscular Once   . thiamine  100 mg Oral Daily   . tuberculin  0.1 mL Intradermal Once   . tuberculin  0.1 mL Intradermal Once       Review of Systems:   A comprehensive review of systems was negative except for:   Respiratory: Negative  Cardiovascular: Negative  Gastrointestinal: Negative  Genitourinary: Negative  Musculoskeletal: Negative  Neurological: positive for tremors       Physical Exam:     Vitals:    03/28/18 0802   BP: 104/75   Pulse: 93   Resp: 18   Temp: 98.2 F (36.8 C)   SpO2: 100%       General appearance - oriented to person, place, and time   Mental status - anxious   Eyes - pupils equal and reactive, extraocular eye movements intact  Ears - bilateral TM's and external ear canals normal  Nose - no septal perforation  Mouth - mucous membranes moist, pharynx normal without lesions  Neck - supple, no significant adenopathy  Chest - clear to auscultation, no wheezes, rales or rhonchi, symmetric air entry  Heart - normal rate, regular rhythm, normal S1, S2, no murmurs, rubs, clicks or gallops  Abdomen - soft, nontender, nondistended, no masses or organomegaly  Neurological -  alert, oriented, normal speech,   Musculoskeletal - no joint tenderness, deformity or swelling  Extremities - peripheral pulses normal, no pedal edema  Skin - plethoric, flushed      Labs:     Results     Procedure Component Value Units Date/Time    Glucose Whole Blood - POCT [[id number]]  (Abnormal) Collected:  03/28/18 0633     Updated:  03/28/18 0636     POCT - Glucose Whole blood 210 mg/dL     Glucose Whole Blood - POCT [[id number]]  (Abnormal) Collected:  03/27/18 1615     Updated:  03/27/18 1616     POCT - Glucose Whole blood 369 mg/dL               Signed by: Nicholaus Bloom, MD      I certify that inpatient services are medically necessary for this patient due to Alcohol Withdrawal and the resultant risk to self.Marland Kitchen Please see H&P and MD progress notes for additional information about the patient's course of treatment. The patient continues to need active treatment furnished by and requiring the supervision of  inpatient facility personnel. I certify that inpatient services are medically necessary for treatment which could reasonably be expected to improve the patient's condition. Please see H&P and MD progress notes for additional information about the patient's course of treatment. For Medicare patients, services to be provided in accordance with 412.3 and expected LOS to be greater than 2 midnights for Medicare patients.

## 2018-03-28 NOTE — Plan of Care (Signed)
Problem: Addiction to alcohol or opioids or other substances AS EVIDENCED BY:  Goal: Patient achieves a safe detoxification and management of withdrawal symptoms  03/28/2018 1755 by Brand Males, RN  Flowsheets (Taken 03/28/2018 1755)  Patient achieves a safe detoxification and management of withdrawal symptoms: Assess withdrawal signs/symptoms according to identified protocol (e.g. CIWA/COWS); Provide medication teaching including name, dosage, benefits, action, effect and side effects; Assess effectiveness (relief of withdrawal symptoms) and side effects of medication  03/28/2018 1755 by Brand Males, RN  Outcome: Progressing  Tristan Goodwin was A&Ox4 during the shift. He complied with his scheduled medications and was given PRN Serax 15 mg at 0845 and 1618 for CIWAs of 10 and 10. He has been eating meals in the dining area with the others and attending groups. Blood glucose at 1617 was 275 and he was encouraged to avoid sugary food and beverages. VSS at 1611 and he continues to be assessed q4h via the CIWA, per day 4 protocol. He currently denies any pain or discomfort. Will continue to monitor and support as needed.

## 2018-03-28 NOTE — Plan of Care (Signed)
Problem: Addiction to alcohol or opioids or other substances AS EVIDENCED BY:  Goal: Patient's recovery goal in his/her own words:  Outcome: Progressing  Goal: Patient achieves a safe detoxification and management of withdrawal symptoms  Outcome: Progressing  Goal: Patient educated about the disease of addiction and recovery and identifies long-term goal  Outcome: Progressing  Goal: Patient develops a discharge plan  Outcome: Progressing     Problem: Tobacco/Nicotine use/dependence: AS EVIDENCED BY...  Goal: Utilizes cessation medication to decrease tobacco/nicotine cravings and/or withdrawal  Outcome: Progressing     Problem: Moderate/High Fall Risk Score >5  Goal: Patient will remain free of falls  Outcome: Progressing     Patient is alert and orientedx4. Patient attended AA meeting and watched TV in dining room with peers. Patient is guarded and irritable at times. Denies questions/concerns. Monitor CIWA every 4 hours and medicate with PRN serax. Patient experiences minimal withdraw symptoms at this time. Continue to monitor CIWA, safety and rounding.

## 2018-03-28 NOTE — Treatment Plan (Signed)
Interdisciplinary Treatment Plan Update Meeting    03/28/2018  Randalyn Rhea    Participants:  Patient:  Randalyn Rhea  Attending Physician:  Nicholaus Bloom, MD  RN: Kirke Shaggy  Social Worker: Ludger Nutting and Fleet Contras    Objective:  Review response to treatment, reassess needs/goals, update plan as indicated incorporating patient's strengths and stated needs, goals, and preferences.    Socrates score:    Recognition:34-35 High recognition: Treatment team will assist Kavir with making aftercare plans. Luismiguel is ready to take steps.      Ambivalence:17-20 High ambivalence: Paired with recognition and taking steps, Juniper is ready to make aftercare plans. Treatment team will assist Jevon in forming an appropriate aftercare plan.     Taking steps: 36-40 Taking Steps High Score: Treatment team will assist Franco with making solid aftercare plans.    1. Summary of Patient Progress on Treatment Plan Goals:  Eduar reports he wants to stay in detox as long as possible. Pt had [employer] through work, but due to losing his job, he will no longer have health insurance as of April 01, 2018. Pt stated he has been having shakes and tremors. Pt is interested in hearing about community resources once he determines if he's going to stay in [place] or [place]. Pt's discharge date is pending for April 01, 2018.    2. Level of Patient Involvement:  Passively engaged    3. Patient Understanding of Plan of Care:  Able to verbalize goals and interventions, Able to verbalize discharge criteria    4. Level of Agreement/Commitment to Plan of Care:  Passively agrees with plan of care          Contributor Signatures:      MD_________________________________ Date___________________    SW_________________________________Date ___________________    RN _________________________________Date____________________

## 2018-03-28 NOTE — Plan of Care (Signed)
Case Management Note: Hand out provided to Pt.    [facility] Department of Social Services   [phone]   [address] [place], [place], [place] [address]  *Can help you apply for [facility]    [facility]   (Psychotherapeutic Services is a Mental Health Treatment Services)  [phone]  [address], [place], [place], [address]  *May be able to make an appointment ahead of time.    [facility]  (Mental Health and Substance Use Services)  [phone]  [address] [place], [place] [address]  *Walk-in only

## 2018-03-29 LAB — GLUCOSE WHOLE BLOOD - POCT
Whole Blood Glucose POCT: 230 mg/dL — ABNORMAL HIGH (ref 70–100)
Whole Blood Glucose POCT: 212 mg/dL — ABNORMAL HIGH (ref 70–100)

## 2018-03-29 MED ORDER — NAPROXEN 250 MG PO TABS
500.00 mg | ORAL_TABLET | Freq: Two times a day (BID) | ORAL | Status: DC | PRN
Start: 2018-03-29 — End: 2018-03-31
  Administered 2018-03-29: 16:00:00 500 mg via ORAL
  Filled 2018-03-29: qty 2

## 2018-03-29 NOTE — Progress Notes (Signed)
Daily Note:    Affect/Mood:  Appropriate    Thought Process:  Logical    Thought Content:  Within Normal Limits    Interpersonal:  Guarded    Treatment Plan Goal Addressed: Patient educated about the disease of addiction and recovery      Intervention Used and Patient Response:   Encourage and monitor group attendance    Counselor initiated meeting with pt in dining area and pt initially reported "I don't have anything to talk about." Pt then shared a bit about his drinking and how it has impacted his relationships recently.  Pt shared that he is currently "homeless" and is planning to move closer to family members in [place].  Pt stated that he was unsure of aftercare options in [place] that sw had previously provided to him. Pt shared that it is difficult and uncomfortable to sit through groups.  T/W encouraged pt to attend and to stand up and move if he needs to do so during group. Pt agreed that he would attend the next group and was observed to be in attendance.    Patient Participation in Groups :  Patient did not attend attend Community Group or morning NA meeting. Pt did attend education group about bio/psycho/social model as well as Task Group.

## 2018-03-29 NOTE — Progress Notes (Signed)
PROGRESS NOTE    Date Time: 03/29/18 11:24 AM  Patient Name: Tristan Goodwin      Chief Complaint:   Doing better  Awaiting aftercare plans        Assessment:   Alcohol Use Disorder: SEVRERE  Alcohol Withdrawal  Anxiety  Hypomagnesemia  Alcohol Liver Disease  Ataxia  T2DM  HTN  Thrombocytopenia      Plan:   Continue with Detoxification  Serax Protocol  SA Counseling  Discussed with treatment team  Review labs with patients  Discharge planning: TBD  Fall Precautions         Medications:     Current Facility-Administered Medications   Medication Dose Route Frequency   . famotidine  20 mg Oral BID   . folic acid  1 mg Oral Daily   . metFORMIN  1,000 mg Oral BID Meals   . multivitamin  1 tablet Oral Daily   . nicotine  1 patch Transdermal Daily   . thiamine  100 mg Intramuscular Once   . thiamine  100 mg Oral Daily   . tuberculin  0.1 mL Intradermal Once   . tuberculin  0.1 mL Intradermal Once       Review of Systems:   A comprehensive review of systems was negative except for:   Respiratory: Negative  Cardiovascular: Negative  Gastrointestinal: Negative  Genitourinary: Negative  Musculoskeletal: Negative  Neurological: positive for tremors       Physical Exam:     Vitals:    03/29/18 0800   BP: 94/65   Pulse: (!) 120   Resp: 16   Temp: 97.5 F (36.4 C)   SpO2: 99%       General appearance - oriented to person, place, and time   Mental status - anxious   Eyes - pupils equal and reactive, extraocular eye movements intact  Ears - bilateral TM's and external ear canals normal  Nose - no septal perforation  Mouth - mucous membranes moist, pharynx normal without lesions  Neck - supple, no significant adenopathy  Chest - clear to auscultation, no wheezes, rales or rhonchi, symmetric air entry  Heart - normal rate, regular rhythm, normal S1, S2, no murmurs, rubs, clicks or gallops  Abdomen - soft, nontender, nondistended, no masses or organomegaly  Neurological - alert, oriented, normal speech,   Musculoskeletal - no  joint tenderness, deformity or swelling  Extremities - peripheral pulses normal, no pedal edema  Skin - sweats      Labs:     Results     Procedure Component Value Units Date/Time    Glucose Whole Blood - POCT [[id number]]  (Abnormal) Collected:  03/29/18 0817     Updated:  03/29/18 0820     POCT - Glucose Whole blood 230 mg/dL     Glucose Whole Blood - POCT [[id number]]  (Abnormal) Collected:  03/28/18 1617     Updated:  03/28/18 1620     POCT - Glucose Whole blood 275 mg/dL               Signed by: Nicholaus Bloom, MD      I certify that inpatient services are medically necessary for this patient due to Alcohol Withdrawal and the resultant risk to self.Marland Kitchen Please see H&P and MD progress notes for additional information about the patient's course of treatment. The patient continues to need active treatment furnished by and requiring the supervision of inpatient facility personnel. I certify that inpatient services are medically necessary for treatment  which could reasonably be expected to improve the patient's condition. Please see H&P and MD progress notes for additional information about the patient's course of treatment. For Medicare patients, services to be provided in accordance with 412.3 and expected LOS to be greater than 2 midnights for Medicare patients.

## 2018-03-29 NOTE — Plan of Care (Signed)
HR was 120 at start of shift.  Serax 15 mg given with good effect.  Tristan Goodwin was visible in the milieu and social with select peers.  He received Naproxen 500 mg x1 for left leg pain with good effect.  Blood sugar was 230 at breakfast and 212 at dinner.  Scheduled doses of Metformin administered.  Denies AH/VH.  Remains alert and oriented x4.  Will continue to monitor on q 15 min checks.

## 2018-03-30 DIAGNOSIS — Z7141 Alcohol abuse counseling and surveillance of alcoholic: Secondary | ICD-10-CM

## 2018-03-30 LAB — GLUCOSE WHOLE BLOOD - POCT
Whole Blood Glucose POCT: 196 mg/dL — ABNORMAL HIGH (ref 70–100)
Whole Blood Glucose POCT: 228 mg/dL — ABNORMAL HIGH (ref 70–100)
Whole Blood Glucose POCT: 249 mg/dL — ABNORMAL HIGH (ref 70–100)

## 2018-03-30 NOTE — Plan of Care (Signed)
Problem: Addiction to alcohol or opioids or other substances AS EVIDENCED BY:  Goal: Patient's recovery goal in his/her own words:  Outcome: Progressing  Goal: Patient achieves a safe detoxification and management of withdrawal symptoms  Outcome: Progressing  Goal: Patient educated about the disease of addiction and recovery and identifies long-term goal  Outcome: Progressing  Goal: Patient develops a discharge plan  Outcome: Progressing     Problem: Tobacco/Nicotine use/dependence: AS EVIDENCED BY...  Goal: Utilizes cessation medication to decrease tobacco/nicotine cravings and/or withdrawal  Outcome: Progressing     Problem: Moderate/High Fall Risk Score >5  Goal: Patient will remain free of falls  Outcome: Progressing     Tristan Goodwin is day 6 on Serax for alcohol withdrawal.  Chief complaint is leg pain which he receives naproxen for.  He is diabetic with accuchecks BID.  Vitals were stable and WNL.  He plans on following up with residential in [place].  Will continue to monitor for safety and care plan progression.

## 2018-03-30 NOTE — Discharge Instructions (Addendum)
[facility] Continuing Care Plan, including the After Visit Summary (AVS) and the Physician's Discharge Summary can be viewed by anyone who has access to Epic.    RECOMMENDED THAT YOU COMPLY WITH THE FOLLOWING PLAN:   Follow up appointment with: [facility] Department of Social Services     Date: TBD      Time: TBD  Location: [address] [place], [place], [place] [address]    P: [phone]       FOR EMERGENCY MENTAL HEALTH or Substance Abuse Appointment, CONTACT [facility] at [phone].    Community Services Board:   [place] Location  Phone Number: [phone]   Address: [address] [place], [place] [address] [place], Geographical information systems officer free in the garage, level [address]    [employer] Suicide Prevention Lifeline :  [phone]    Tobacco Cessation Discharge Referral for Evidence Based Counseling:     (group occurs every Tuesday morning at 11:00am at the [facility] Building Floor 7 Conference Room)    Follow up appointment with:  [facility]   Date: 04/04/2018       Time: 11:00am  Location:     [facility] Services Building Floor 7 Conference Room- [address] [place], [place] [address]    P: [phone]  The following were reviewed with patient/family by Mora Appl RN    1. Reason for IP admission  2. Major procedures and tests, including summary of results  3. Diagnosis at discharge  4. Current medication list  5. Were studies pending at discharge?     No                                         6. Patient instructions;  Adult Wellness, Recovery, and Safety Plan  7. Emergency contact information  8. Plan for follow up care   9. Name of provider(s), appointment(s), and location(s) of follow up care.                                              Advance Care Plan  10. Patient had a medical and mental health Advance Directive at admission. [id number] about completing Advance Directives or designating a surrogate decision maker, and a form, was provided.                                                                                 11. After receiving information- Did patient create a medical and mental health Advance Directive or appoint a surrogate decision maker?    No  Do not believe I will need a Mental Health Advance Directive

## 2018-03-30 NOTE — Progress Notes (Signed)
PROGRESS NOTE    Date Time: 03/30/18 9:53 AM  Patient Name: Tristan Goodwin      Chief Complaint:   Fatigue  Still feels anxious  Concerned about aftercare        Assessment:   Alcohol Use Disorder: SEVRERE  Alcohol Withdrawal  Anxiety  Hypomagnesemia  Alcohol Liver Disease  Ataxia  T2DM  HTN  Thrombocytopenia      Plan:   Continue with Detoxification  Serax Protocol  SA Counseling  Discussed with treatment team  Review labs with patients  Discharge planning: TBD         Medications:     Current Facility-Administered Medications   Medication Dose Route Frequency   . famotidine  20 mg Oral BID   . folic acid  1 mg Oral Daily   . metFORMIN  1,000 mg Oral BID Meals   . multivitamin  1 tablet Oral Daily   . nicotine  1 patch Transdermal Daily   . thiamine  100 mg Intramuscular Once   . thiamine  100 mg Oral Daily   . tuberculin  0.1 mL Intradermal Once   . tuberculin  0.1 mL Intradermal Once       Review of Systems:   A comprehensive review of systems was negative except for:   Respiratory: Negative  Cardiovascular: Negative  Gastrointestinal: Negative  Genitourinary: Negative  Musculoskeletal: Negative  Neurological: positive for tremors       Physical Exam:     Vitals:    03/30/18 0849   BP: 104/70   Pulse: 90   Resp:    Temp: 97.5 F (36.4 C)   SpO2: 97%       General appearance - oriented to person, place, and time   Mental status - anxious   Eyes - pupils equal and reactive, extraocular eye movements intact  Ears - bilateral TM's and external ear canals normal  Mouth - mucous membranes moist, pharynx normal without lesions  Neck - supple, no significant adenopathy  Chest - clear to auscultation, no wheezes, rales or rhonchi, symmetric air entry  Heart - normal rate, regular rhythm, normal S1, S2, no murmurs, rubs, clicks or gallops  Abdomen - soft, nontender, nondistended, no masses or organomegaly  Neurological - alert, oriented, normal speech,   Musculoskeletal - no joint tenderness, deformity or  swelling  Extremities - peripheral pulses normal, no pedal edema  Skin - sweats      Labs:     Results     Procedure Component Value Units Date/Time    Glucose Whole Blood - POCT [[id number]]  (Abnormal) Collected:  03/30/18 0731     Updated:  03/30/18 0733     POCT - Glucose Whole blood 196 mg/dL     Glucose Whole Blood - POCT [[id number]]  (Abnormal) Collected:  03/29/18 1706     Updated:  03/29/18 1708     POCT - Glucose Whole blood 212 mg/dL               Signed by: Nicholaus Bloom, MD      I certify that inpatient services are medically necessary for this patient due to Alcohol Withdrawal and the resultant risk to self.Marland Kitchen Please see H&P and MD progress notes for additional information about the patient's course of treatment. The patient continues to need active treatment furnished by and requiring the supervision of inpatient facility personnel. I certify that inpatient services are medically necessary for treatment which could reasonably be expected to improve  the patient's condition. Please see H&P and MD progress notes for additional information about the patient's course of treatment. For Medicare patients, services to be provided in accordance with 412.3 and expected LOS to be greater than 2 midnights for Medicare patients.

## 2018-03-30 NOTE — Plan of Care (Signed)
Problem: Addiction to alcohol or opioids or other substances AS EVIDENCED BY:  Goal: Patient achieves a safe detoxification and management of withdrawal symptoms  Outcome: Progressing   Tristan Goodwin has been visible in the unit and spent the evening watching television with his peers. He has had no c/o withdrawal symptoms. Tristan Goodwin was medicated for sleep at 0009 with Trazodone 75 mg po. Since being medicated he has been sleeping well as observed on Q 15 minute safety checks. Will continue to monitor and support.

## 2018-03-30 NOTE — Plan of Care (Signed)
Problem: Addiction to alcohol or opioids or other substances AS EVIDENCED BY:  Goal: Patient educated about the disease of addiction and recovery and identifies long-term goal  Outcome: Progressing   Daily Note:    Affect/Mood:  Mildly depressed, Anxious    Thought Process:  Logical    Thought Content:  Within Normal Limits    Interpersonal:  Discussed Issues and Attentive; Cooperative, Open and Engaged    Treatment Plan Goal Addressed:   Patient educated about the disease of addiction and recovery and identifies long-term goal    Intervention Used and Patient Response:   2. Assist patient to identify coping strategies to use as alternatives to alcohol/substance abuse/dependency  Pt reported he had not slept well last night and continued to express concern that his sleep patterns were not yet on track; counselor provided support and reassured pt, and again provided psychoeducation on sleep hygiene and the acute/post-acute withdrawal process. Pt also reported feeling anxious about leaving detox tomorrow, as this is a safe space and he feels comfortable here. Pt stated he wishes he could "stay a few more days". Counselor normalized feeling anxious about discharge and processed said feeling with pt; counselor also provided rationale for medical detox and explained that pt does not meet criteria to continue hospital-level stay; pt was receptive and appeared to understand. Counselor and pt then discussed pt's plan for maintaining sobriety outside of detox, as pt is returning to [place]. Pt was able to identify sober supports, ways to avoid isolation, and expressed interest in connecting with local community services (referral list given by SW). Counselor provided emotional support and encouraged pt to continue engaging in treatment and to reach out to staff as needed.    Patient Participation in Groups:  Pt missed morning [employer], but has been attending other groups throughout the day off and on.

## 2018-03-31 LAB — GLUCOSE WHOLE BLOOD - POCT: Whole Blood Glucose POCT: 208 mg/dL — ABNORMAL HIGH (ref 70–100)

## 2018-03-31 NOTE — Progress Notes (Signed)
Tristan Goodwin is alert and oriented x 4 and appropriate on encounter. Ambulatory with steady gait. Denies current SI/HI/SIB. Aftercare plan of ff-up with [facility] of Social Services. All belongings with patient upon discharge. Personal belongings kept on admission were returned. No personal medications to return. Prescriptions received are the ff: Vistaril 25mg  Take one capsule po Q6H PRN #20, Metformin 1000mg  Take one tab po BID #4305, Nicoderm 21mg  Place one patch QD #28, Famotidine 20mg  Take one tab po BID #30 and Trazodone 150mg  Take 0.5 tab po QHS PRN  #15. Discussed AVS, educated about medications and discharge instructions. Stated understanding, paperwork signed and given to patient. Cooperative with discharge process. Tammy Sours (patient's friend) came and picked patient up. Out of unit approximately by 08:50AM.

## 2018-03-31 NOTE — Plan of Care (Signed)
Problem: Addiction to alcohol or opioids or other substances AS EVIDENCED BY:  Goal: Patient's recovery goal in his/her own words:  Outcome: Progressing  Goal: Patient achieves a safe detoxification and management of withdrawal symptoms  Outcome: Progressing  Flowsheets (Taken 03/31/2018 0201)  Patient achieves a safe detoxification and management of withdrawal symptoms: Assess withdrawal signs/symptoms according to identified protocol (e.g. CIWA/COWS); Provide medication teaching including name, dosage, benefits, action, effect and side effects; Assess effectiveness (relief of withdrawal symptoms) and side effects of medication; Educate about health risks associated with withdrawal (e.g. seizures, DTs); Ensure adequate hydration and nutrition during withdrawal; Educate about the importance of reporting dizziness or unsteadiness while ambulating to care providers (e.g. RN, LIP)  Note:   Patient has been relatively stable and his ciwa score has been low. Patient observed in the milieu with bright affect. Patient denies SI/HI, denies auditory or visual hallucination. Patient received trazodone prn for sleep. Medication was well tolerated. Will continue to monitor patient. Every 15 minutes safety check maintained.  Goal: Patient educated about the disease of addiction and recovery and identifies long-term goal  Outcome: Progressing  Goal: Patient develops a discharge plan  Outcome: Progressing     Problem: Tobacco/Nicotine use/dependence: AS EVIDENCED BY...  Goal: Utilizes cessation medication to decrease tobacco/nicotine cravings and/or withdrawal  Outcome: Progressing     Problem: Moderate/High Fall Risk Score >5  Goal: Patient will remain free of falls  Outcome: Progressing

## 2018-03-31 NOTE — Progress Notes (Signed)
PT was discharged by the time treatment rounds started and this writer is unable to delete this.

## 2018-03-31 NOTE — Discharge Summary -  Counselor (Signed)
[facility]   Discharge Summary    Patient Name: Tristan Goodwin, Tristan Goodwin  Date of Birth: 1965-12-25  Medical Record Number: [id number]  Level of Care: Inpatient  Admission Date: 03/24/2018.  Unit Discharge Date: 03/31/2018   Reason For Discharge: Treatment Completed    Initial Justification for Treatment:   Tristan Goodwin is a 51 y.o. divorced Caucasian male presenting with diagnosis of Alcohol Abuse Disorder, Alcohol Withdrawals, Alcohol Dependence, Substance Induced Mood Disorder and treatment. This was patient's first treatment episode at [facility] and second treatment episode overall. Patient reports the Presenting Problem: etoh use disorder Patient Stated Goal: "to stop drinking"    Detailed Drug Use History  Any current alcohol and/or drug use?: Yes  Reason for Alcohol or Drug Use: "mostly cause I like it and manage my issues"  Alcohol Use: In Lifetime             Age first used alcohol:  : 28            Max used in a day: unsure            Pattern of use:: half a gallon of vodka every other day - 3 days for the past 15 years            Last use:: 03/23/18            Longest Abstinence:  : 35~ days while in rehab  Tranquilizers/Benzodiazepines:: n/a  Tranquilizers/Benzodiazepines #2:: n/a  Tranquilizers/Benzodiazepines #3: n/a  Heroin Use: N/A  Rx Opiates #1: : n/a(prescribed post op, denies abuse)  Rx Opiates #2: : n/a  Rx Opiates #3: : n/a  Opiates Use #4:: n/a            Opiates Use #5:: n/a  Opiates Use #6:: n/a  Non-Rx Methadone Use: N/A  Amphetamine Use: N/A  Marijuana Use: N/A(use hx during teenager )  Cocaine Use: N/A  Hallucinogens Use: N/A  Inhalants Use: N/A  PCP Use: N/A  Nicotine Use: In Lifetime, Smoked            Age first used nicotine:: 12            Years of use:: 47            Max used in a day: 2ppd            Pattern of use:: 2ppd            Last use:: 03/24/18            Longest Abstinence:  : 0  Barbiturates Use: N/A  Other Substance Abuse: n/a  Other Substance Abuse #2:: n/a  Other Substance  Abuse #3:: n/a  Caffeine use:: 1 cup of coffe and 2-3 energy drinks.     Current Withdrawal Symptoms: Irritability, Anxiety, Depression, Tremors, Sweats  Past Withdrawal Symptoms: Anxiety, Irritability, Depression, Cravings, Sweats, Tremors  History of Blackouts?: No  History of Withdrawal Seizures?: No    Summary of Significant Findings:  Upon discharge, patient does not report SI/HI. Patient reports current living situation is currently homeless; Pt reported he became homeless 2 1/2 weeks ago and that he was living alone in an apartment. Patient denies this is a sober environment. Patient reports he does not currently have a sponsor. Patient reports he participates in nothing for leisure activities. Patient describes current social relationships as strained with his family, Pt reported he has not spoken with his children in 2 years. Patient denies legal history. Patient reports highest Education/Highest Grade  Completed: GED . Patient is currently unemployed; Pt reported he lost his jon 2 weeks ago due to drinking. Patient does report job consequences as a result of his substance abuse.Patient does report financial problems currently as he is unemployed. Patient denies medical health issues. Patient reports he is unsure of the last time he saw his medical provider.   Discharge Medications:          Current Discharge Medication List           START taking these medications    Details   famotidine (PEPCID) 20 MG tablet Take 1 tablet (20 mg total) by mouth 2 (two) times daily  Qty: 30 tablet, Refills: 0    Associated Diagnoses: Abnormal clinical finding      hydrOXYzine (VISTARIL) 25 MG capsule Take 1 capsule (25 mg total) by mouth every 6 (six) hours as needed for Anxiety  Qty: 20 capsule, Refills: 0    Associated Diagnoses: Abnormal clinical finding      nicotine (NICODERM CQ) 21 MG/24HR Place 1 patch onto the skin daily  Qty: 28 patch, Refills: 0      traZODone (DESYREL) 150 MG tablet Take 0.5 tablets (75  mg total) by mouth nightly as needed for Sleep  Qty: 15 tablet, Refills: 0              CONTINUE these medications which have CHANGED    Details   metFORMIN (GLUCOPHAGE) 1000 MG tablet Take 1 tablet (1,000 mg total) by mouth 2 (two) times daily with meals  Qty: 30 tablet, Refills: 0              CONTINUE these medications which have NOT CHANGED    Details   folic acid (FOLVITE) 1 MG tablet Take 1 tablet (1 mg total) by mouth daily      vitamin B-1 (B-1) 100 MG tablet Take 1 tablet (100 mg total) by mouth daily             STOP taking these medications       chlordiazePOXIDE (LIBRIUM) 5 MG capsule       Summary of Services Provided:   Pt. was detoxified with Serax protocol. Additional medical therapy included Vistaril, Pepcid, Trazodone & Nicoderm. Pt. was individually assessed and treatment plans were developed. Pt. had been offered education on the disease process of addition, relapse behaviors, self-defeating learned behaviors, defense mechanisms and post acute withdrawal symptoms. Pt. was directed to attend all 12-step meetings and educational groups/lectures. Patient began inpatient level of care on 03/24/2018  5:59 PM. Patient was referred to this level of care by self.  Patient made the following progress towards meeting treatment goals: pt attended groups & 12-step meetings- was not able to complete step-1 task.  Patient's behavior in group was appropriate. Pt's aftercare plan is [facility] in [place] .  Patient did not involve his family in treatment.    Patient presented in the a contemplative stage of change as evidenced by pt's desire to seek continuing care.     Discharge Diagnosis:  Alcohol Use Disorder: SEVERE  Alcohol Withdrawal  Anxiety  Alcohol Liver Disease  T2DM  Hypertension  Thrombocytopenia  Condition at discharge  Pt was medically stabilized at time of discharge    Continuing Care Plan:   Continue attending 12-step meetings   Continue to take all medications, as directed

## 2018-03-31 NOTE — Discharge Summary (Signed)
DISCHARGE SUMMARY    Date Time: 03/31/18 8:23 AM  Patient Name: Tristan Goodwin  Attending Physician: Nicholaus Bloom, MD    Date of Admission:   03/24/2018    Date of Discharge:   03/31/2018    Reason for Admission:   Abnormal clinical finding [R68.89]    Discharge Dx:   Alcohol Use Disorder: SEVERE  Alcohol Withdrawal  Anxiety  Alcohol Liver Disease  T2DM  Hypertension  Thrombocytopenia      Hospital Course:   Patient was admitted to detoxification.  He was on Serax protocol.  He had labile diabetes.  He had hypomagnesemia which required correction.  He was anxious during his stay.  He plans on returning to [facility].  Counselors and SW gave him referrals.  Prognosis is guarded.        Physical Examination:   General appearance - oriented to person, place, and time   Mental status - anxious   Eyes - pupils equal and reactive, extraocular eye movements intact  Ears - bilateral TM's and external ear canals normal  Nose - no septal perforation  Mouth - mucous membranes moist, pharynx normal without lesions  Neck - supple, no significant adenopathy  Chest - clear to auscultation, no wheezes, rales or rhonchi, symmetric air entry  Heart - normal rate, regular rhythm, normal S1, S2, no murmurs, rubs, clicks or gallops  Abdomen - soft, nontender, nondistended, no masses or organomegaly  Neurological - alert, oriented, normal speech, no focal findings   Musculoskeletal - no joint tenderness, deformity or swelling  Extremities - peripheral pulses normal  Skin - normal      Discharge Medications:     Current Discharge Medication List      START taking these medications    Details   famotidine (PEPCID) 20 MG tablet Take 1 tablet (20 mg total) by mouth 2 (two) times daily  Qty: 30 tablet, Refills: 0    Associated Diagnoses: Abnormal clinical finding      hydrOXYzine (VISTARIL) 25 MG capsule Take 1 capsule (25 mg total) by mouth every 6 (six) hours as needed for Anxiety  Qty: 20 capsule, Refills: 0    Associated Diagnoses:  Abnormal clinical finding      nicotine (NICODERM CQ) 21 MG/24HR Place 1 patch onto the skin daily  Qty: 28 patch, Refills: 0      traZODone (DESYREL) 150 MG tablet Take 0.5 tablets (75 mg total) by mouth nightly as needed for Sleep  Qty: 15 tablet, Refills: 0         CONTINUE these medications which have CHANGED    Details   metFORMIN (GLUCOPHAGE) 1000 MG tablet Take 1 tablet (1,000 mg total) by mouth 2 (two) times daily with meals  Qty: 30 tablet, Refills: 0         CONTINUE these medications which have NOT CHANGED    Details   folic acid (FOLVITE) 1 MG tablet Take 1 tablet (1 mg total) by mouth daily      vitamin B-1 (B-1) 100 MG tablet Take 1 tablet (100 mg total) by mouth daily         STOP taking these medications       chlordiazePOXIDE (LIBRIUM) 5 MG capsule                Discharge Instructions:   Discharge home  Follow-up with aftercare in [place]  [facility]  AA/NA  Follow up with PCP  Smoking Cessation Rx/Counseling provided.    I  certify that inpatient services are medically necessary for this patient. Please see H&P and MD progress notes for additional information about the patient's course of treatment. For Medicare patients, services provided in accordance with 412.3 and expected LOS to be greater than 2 midnights for Medicare patients.: Yes.I certify that inpatient services are medically necessary for treatment which could reasonably be expected to improve the patient's condition. Please see H&P and MD progress notes for additional information about the patient's course of treatment. For Medicare patients, services to be provided in accordance with 412.3 and expected LOS to be greater than 2 midnights for Medicare patients.            Signed by: Nicholaus Bloom, MD      Time:  29 mins.

## 2018-04-13 ENCOUNTER — Emergency Department: Payer: 59

## 2018-04-13 ENCOUNTER — Inpatient Hospital Stay
Admission: EM | Admit: 2018-04-13 | Discharge: 2018-04-15 | DRG: 897 | Disposition: A | Discharge status: Home / Self Care | Payer: 59 | Attending: Internal Medicine | Admitting: Internal Medicine

## 2018-04-13 ENCOUNTER — Other Ambulatory Visit: Payer: Self-pay

## 2018-04-13 ENCOUNTER — Encounter: Payer: Self-pay | Admitting: Emergency Medicine

## 2018-04-13 DIAGNOSIS — F10930 Alcohol use, unspecified with withdrawal, uncomplicated: Secondary | ICD-10-CM

## 2018-04-13 DIAGNOSIS — W19XXXA Unspecified fall, initial encounter: Secondary | ICD-10-CM | POA: Diagnosis present

## 2018-04-13 DIAGNOSIS — S2242XA Multiple fractures of ribs, left side, initial encounter for closed fracture: Secondary | ICD-10-CM | POA: Diagnosis present

## 2018-04-13 DIAGNOSIS — F10230 Alcohol dependence with withdrawal, uncomplicated: Secondary | ICD-10-CM

## 2018-04-13 DIAGNOSIS — Z79899 Other long term (current) drug therapy: Secondary | ICD-10-CM

## 2018-04-13 DIAGNOSIS — E119 Type 2 diabetes mellitus without complications: Secondary | ICD-10-CM | POA: Diagnosis present

## 2018-04-13 DIAGNOSIS — F17210 Nicotine dependence, cigarettes, uncomplicated: Secondary | ICD-10-CM | POA: Diagnosis present

## 2018-04-13 DIAGNOSIS — J449 Chronic obstructive pulmonary disease, unspecified: Secondary | ICD-10-CM | POA: Diagnosis present

## 2018-04-13 DIAGNOSIS — E1165 Type 2 diabetes mellitus with hyperglycemia: Secondary | ICD-10-CM

## 2018-04-13 DIAGNOSIS — F10280 Alcohol dependence with alcohol-induced anxiety disorder: Secondary | ICD-10-CM | POA: Diagnosis present

## 2018-04-13 DIAGNOSIS — D6959 Other secondary thrombocytopenia: Secondary | ICD-10-CM | POA: Diagnosis present

## 2018-04-13 DIAGNOSIS — F10939 Alcohol use, unspecified with withdrawal, unspecified: Secondary | ICD-10-CM | POA: Diagnosis present

## 2018-04-13 DIAGNOSIS — S2239XA Fracture of one rib, unspecified side, initial encounter for closed fracture: Secondary | ICD-10-CM | POA: Diagnosis present

## 2018-04-13 DIAGNOSIS — F10239 Alcohol dependence with withdrawal, unspecified: Secondary | ICD-10-CM

## 2018-04-13 DIAGNOSIS — S2249XA Multiple fractures of ribs, unspecified side, initial encounter for closed fracture: Secondary | ICD-10-CM | POA: Diagnosis present

## 2018-04-13 DIAGNOSIS — R0781 Pleurodynia: Secondary | ICD-10-CM

## 2018-04-13 DIAGNOSIS — F1020 Alcohol dependence, uncomplicated: Secondary | ICD-10-CM

## 2018-04-13 HISTORY — DX: Type 2 diabetes mellitus without complications: E11.9

## 2018-04-13 HISTORY — DX: Alcohol dependence with withdrawal, unspecified: F10.239

## 2018-04-13 HISTORY — DX: Chronic obstructive pulmonary disease, unspecified: J44.9

## 2018-04-13 HISTORY — DX: Alcohol abuse, uncomplicated: F10.10

## 2018-04-13 HISTORY — DX: Alcohol use, unspecified with withdrawal, unspecified: F10.939

## 2018-04-13 LAB — COMPREHENSIVE METABOLIC PANEL
ALK PHOS: 53 U/L (ref 38–126)
ALT: 82 U/L — ABNORMAL HIGH (ref 0–44)
AST: 65 U/L — AB (ref 15–41)
Albumin: 4.8 g/dL (ref 3.5–5.0)
Anion gap: 19 — ABNORMAL HIGH (ref 5–15)
BILIRUBIN TOTAL: 1.3 mg/dL — AB (ref 0.3–1.2)
BUN: 15 mg/dL (ref 6–20)
CALCIUM: 8.8 mg/dL — AB (ref 8.9–10.3)
CHLORIDE: 94 mmol/L — AB (ref 98–111)
CO2: 24 mmol/L (ref 22–32)
CREATININE: 0.54 mg/dL — AB (ref 0.61–1.24)
Glucose, Bld: 249 mg/dL — ABNORMAL HIGH (ref 70–99)
Potassium: 3.6 mmol/L (ref 3.5–5.1)
Sodium: 137 mmol/L (ref 135–145)
Total Protein: 8.1 g/dL (ref 6.5–8.1)

## 2018-04-13 LAB — ETHANOL: ALCOHOL ETHYL (B): 103 mg/dL — AB (ref <10)

## 2018-04-13 LAB — CBC
HEMATOCRIT: 40.7 % (ref 39.0–52.0)
Hemoglobin: 14.4 g/dL (ref 13.0–17.0)
MCH: 33.3 pg (ref 26.0–34.0)
MCHC: 35.4 g/dL (ref 30.0–36.0)
MCV: 94 fL (ref 80.0–100.0)
PLATELETS: 112 10*3/uL — AB (ref 150–400)
RBC: 4.33 MIL/uL (ref 4.22–5.81)
RDW: 12.7 % (ref 11.5–15.5)
WBC: 6.5 10*3/uL (ref 4.0–10.5)
nRBC: 0 % (ref 0.0–0.2)

## 2018-04-13 MED ORDER — SODIUM CHLORIDE 0.9 % IV SOLN
Freq: Once | INTRAVENOUS | Status: AC
  Administered 2018-04-13: 20:00:00 via INTRAVENOUS

## 2018-04-13 MED ORDER — LORAZEPAM 2 MG/ML IJ SOLN: 0.0000 mg | Freq: Two times a day (BID) | INTRAMUSCULAR | Status: DC

## 2018-04-13 MED ORDER — ONDANSETRON HCL 4 MG/2ML IJ SOLN
INTRAMUSCULAR | Status: AC
  Filled 2018-04-13: qty 2

## 2018-04-13 MED ORDER — LORAZEPAM 2 MG/ML IJ SOLN
0.0000 mg | Freq: Four times a day (QID) | INTRAMUSCULAR | Status: DC
  Administered 2018-04-13: 2 mg via INTRAVENOUS

## 2018-04-13 MED ORDER — LORAZEPAM 2 MG/ML IJ SOLN
INTRAMUSCULAR | Status: AC
  Filled 2018-04-13: qty 1

## 2018-04-13 MED ORDER — LORAZEPAM 2 MG PO TABS: 0.0000 mg | ORAL_TABLET | Freq: Two times a day (BID) | ORAL | Status: DC

## 2018-04-13 MED ORDER — ONDANSETRON HCL 4 MG/2ML IJ SOLN
4.0000 mg | Freq: Once | INTRAMUSCULAR | Status: AC
  Administered 2018-04-13: 4 mg via INTRAVENOUS

## 2018-04-13 MED ORDER — VITAMIN B-1 100 MG PO TABS: 100.0000 mg | ORAL_TABLET | Freq: Every day | ORAL | Status: DC

## 2018-04-13 MED ORDER — LORAZEPAM 2 MG PO TABS: 0.0000 mg | ORAL_TABLET | Freq: Four times a day (QID) | ORAL | Status: DC

## 2018-04-13 MED ORDER — THIAMINE HCL 100 MG/ML IJ SOLN
100.0000 mg | Freq: Every day | INTRAMUSCULAR | Status: DC
  Administered 2018-04-13: 100 mg via INTRAVENOUS
  Filled 2018-04-13: qty 2

## 2018-04-13 MED ORDER — NICOTINE 21 MG/24HR TD PT24
MEDICATED_PATCH | TRANSDERMAL | Status: AC
  Administered 2018-04-13: 21 mg
  Filled 2018-04-13: qty 1

## 2018-04-13 NOTE — H&P (Signed)
[name] NAME: Shaun Roberts    MR#:  [id number]  DATE OF BIRTH:  1966-03-07  DATE OF ADMISSION:  04/13/2018  PRIMARY CARE PHYSICIAN: Patient, No Pcp Per   REQUESTING/REFERRING PHYSICIAN: Clearnce Hasten, MD  CHIEF COMPLAINT:   Chief Complaint  Patient presents with  . Alcohol Problem    HISTORY OF PRESENT ILLNESS:  Shaun Roberts  is a 52 y.o. male who presents with chief complaint as above.  Patient presents due to alcohol withdrawal.  Here he was found to have several rib fractures from a fall that occurred 2 days ago.  He just completed detox program, but began drinking again very soon after discharge.  He states that he drinks half a gallon of tequila daily.  He certainly has symptoms of withdrawal here in the ED.  Hospitalist were called for admission  PAST MEDICAL HISTORY:   Past Medical History:  Diagnosis Date  . Alcohol abuse   . COPD (chronic obstructive pulmonary disease) ([place])   . Diabetes mellitus without complication ([place])      PAST SURGICAL HISTORY:   Past Surgical History:  Procedure Laterality Date  . KNEE RECONSTRUCTION       SOCIAL HISTORY:   Social History   Tobacco Use  . Smoking status: Current Every Day Smoker  . Smokeless tobacco: Never Used  Substance Use Topics  . Alcohol use: Yes    Comment: see notes     FAMILY HISTORY:  Family history reviewed and is non-contributory.   DRUG ALLERGIES:  No Known Allergies  MEDICATIONS AT HOME:   Prior to Admission medications   Not on File    REVIEW OF SYSTEMS:  Review of Systems  Constitutional: Positive for malaise/fatigue. Negative for chills, fever and weight loss.  HENT: Negative for ear pain, hearing loss and tinnitus.   Eyes: Negative for blurred vision, double vision, pain and redness.  Respiratory: Negative for cough, hemoptysis and shortness of breath.   Cardiovascular: Positive for chest pain (Thoracic around rib  fractures). Negative for palpitations, orthopnea and leg swelling.  Gastrointestinal: Negative for abdominal pain, constipation, diarrhea, nausea and vomiting.  Genitourinary: Negative for dysuria, frequency and hematuria.  Musculoskeletal: Negative for back pain, joint pain and neck pain.  Skin:       No acne, rash, or lesions  Neurological: Negative for dizziness, tremors, focal weakness and weakness.  Endo/Heme/Allergies: Negative for polydipsia. Does not bruise/bleed easily.  Psychiatric/Behavioral: Negative for depression. The patient is not nervous/anxious and does not have insomnia.      VITAL SIGNS:   Vitals:   04/13/18 2130 04/13/18 2200 04/13/18 2230 04/13/18 2300  BP: (!) 142/92 139/90 (!) 145/90 (!) 146/88  Pulse: (!) 105 99  99  Resp:      Temp:      TempSrc:      SpO2: 94% 92%  92%  Weight:      Height:       Wt Readings from Last 3 Encounters:  04/13/18 74.8 kg    PHYSICAL EXAMINATION:  Physical Exam  Vitals reviewed. Constitutional: He is oriented to person, place, and time. He appears well-developed and well-nourished. No distress.  HENT:  Head: Normocephalic and atraumatic.  Mouth/Throat: Oropharynx is clear and moist.  Eyes: Pupils are equal, round, and reactive to light. Conjunctivae and EOM are normal. No scleral icterus.  Neck: Normal range of motion. Neck supple. No JVD present. No thyromegaly present.  Cardiovascular: Regular rhythm and  intact distal pulses. Exam reveals no gallop and no friction rub.  No murmur heard. Tachycardic  Respiratory: Effort normal and breath sounds normal. No respiratory distress. He has no wheezes. He has no rales.  GI: Soft. Bowel sounds are normal. He exhibits no distension. There is no tenderness.  Musculoskeletal: Normal range of motion. He exhibits no edema.  No arthritis, no gout  Lymphadenopathy:    He has no cervical adenopathy.  Neurological: He is alert and oriented to person, place, and time. No cranial  nerve deficit.  No dysarthria, no aphasia  Skin: Skin is warm and dry. No rash noted. No erythema.  Psychiatric: He has a normal mood and affect. His behavior is normal. Judgment and thought content normal.    LABORATORY PANEL:   CBC Recent Labs  Lab 04/13/18 1916  WBC 6.5  HGB 14.4  HCT 40.7  PLT 112*   ------------------------------------------------------------------------------------------------------------------  Chemistries  Recent Labs  Lab 04/13/18 1916  NA 137  K 3.6  CL 94*  CO2 24  GLUCOSE 249*  BUN 15  CREATININE 0.54*  CALCIUM 8.8*  AST 65*  ALT 82*  ALKPHOS 53  BILITOT 1.3*   ------------------------------------------------------------------------------------------------------------------  Cardiac Enzymes No results for input(s): TROPONINI in the last 168 hours. ------------------------------------------------------------------------------------------------------------------  RADIOLOGY:  Dg Ribs Unilateral W/chest Left  Result Date: 04/13/2018 CLINICAL DATA:  Anterior left chest pain after a fall. EXAM: LEFT RIBS AND CHEST - 3+ VIEW COMPARISON:  None. FINDINGS: The lungs appear clear.  Cardiac and mediastinal contours normal. No pleural effusion identified. No pulmonary contusion or pneumothorax. Suspected nondisplaced rib fractures of the left anterior sixth and eighth ribs. IMPRESSION: Nondisplaced anterior fractures left sixth and eighth ribs. Electronically Signed   By: Shaun Roberts M.D.   On: 04/13/2018 20:54    EKG:   Orders placed or performed during the hospital encounter of 04/13/18  . EKG 12-Lead  . EKG 12-Lead    IMPRESSION AND PLAN:  Principal Problem:   Alcohol withdrawal (HCC) -p.o. Librium, CIWA protocol with as needed Ativan other supportive treatment PRN Active Problems:   Rib fractures -incentive spirometry, PRN analgesia   COPD (chronic obstructive pulmonary disease) ([place]) -continue home meds   Diabetes (HCC)  -sliding scale insulin with corresponding glucose checks  Chart review performed and case discussed with ED provider. Labs, imaging and/or ECG reviewed by provider and discussed with patient/family. Management plans discussed with the patient and/or family.  DVT PROPHYLAXIS: SubQ lovenox   GI PROPHYLAXIS:  None  ADMISSION STATUS: Inpatient     CODE STATUS: Full  TOTAL TIME TAKING CARE OF THIS PATIENT: 45 minutes.   Denham Mose Newton 04/13/2018, 11:31 PM  [facility]  Office  [phone]  CC: Primary care physician; Patient, No Pcp Per  Note:  This document was prepared using Dragon voice recognition software and may include unintentional dictation errors.

## 2018-04-13 NOTE — ED Notes (Signed)
Pt to xray

## 2018-04-13 NOTE — BH Assessment (Addendum)
Writer discussed with the patient about the option for detox/treatment with [facility].  Patient was in the agreement with the plan.  Patient signed the Release of information, in order to send labs and ER Note.  Information sent sent to [facility] via fax and confirmed it was received ([phone]) Pending Review at this time  This Probation officer received a call back from [facility] with disposition for patient. Medically pt was stable and in good condition to be transferred to their facility however, he just completed a detox program 2 weeks ago. Per Tiffany he would have to wait a total of 30 days before they would be allowed to accept him into their program. This Probation officer called [facility] and was given the same information. Pt must have a 30 day break between admissions.

## 2018-04-13 NOTE — ED Notes (Signed)
Pt to the ER for detox. Pt is a daily drinker and is currently nauseated and shaking. Pt has completed detox two times prior successfully. Family is at bedside.

## 2018-04-13 NOTE — ED Triage Notes (Signed)
Patient to ER for alcohol detox. Denies any history of seizures with attempting to stop drinking before. Patient reports shaking all day today. Last ETOH was today at approx 0900. Typically drinks approx 1/2 gallon vodka in 1.5 days. Patient also c/o pain to left ribs, states he fell yesterday. Denies any head injury or LOC at the time.

## 2018-04-13 NOTE — ED Provider Notes (Addendum)
[facility] Emergency Department Provider Note  ___________________________________________   First MD Initiated Contact with Patient 04/13/18 1920     (approximate)  I have reviewed the triage vital signs and the nursing notes.   HISTORY  Chief Complaint Alcohol Problem   HPI Shaun Roberts is a 52 y.o. male with a history of COPD, diabetes as well as alcohol abuse was presented to the emergency department requesting alcohol detox.  He says the last time he was in detox was 2 weeks ago when he completed detox treatment.  He says that he has been in detox twice this year and completed both times.  However, he states that he has drinking again.  Drinking approximately half gallon of vodka every 1/2 days.  Also smoking approximately 2 packs of cigarettes per day.  Patient says that his last drink was this morning.  Says that he feels shaky and that this happens sometimes when he stops drinking but he has never had a seizure.  Patient says that he also fell at approximately noon on 04/12/2018, hitting his left lower ribs.  Says the pain is mild at this time when he is at rest and not moving.   Past Medical History:  Diagnosis Date  . Alcohol abuse   . COPD (chronic obstructive pulmonary disease) ([place])   . Diabetes mellitus without complication ([place])     There are no active problems to display for this patient.   Past Surgical History:  Procedure Laterality Date  . KNEE RECONSTRUCTION      Prior to Admission medications   Not on File    Allergies Patient has no known allergies.  No family history on file.  Social History Social History   Tobacco Use  . Smoking status: Current Every Day Smoker  . Smokeless tobacco: Never Used  Substance Use Topics  . Alcohol use: Yes    Comment: see notes  . Drug use: Not on file    Review of Systems  Constitutional: No fever/chills Eyes: No visual changes. ENT: No sore throat. Cardiovascular: As  above Respiratory: Denies shortness of breath. Gastrointestinal: No abdominal pain.  No nausea, no vomiting.  No diarrhea.  No constipation. Genitourinary: Negative for dysuria. Musculoskeletal: Negative for back pain. Skin: Negative for rash. Neurological: Negative for headaches, focal weakness or numbness.   ____________________________________________   PHYSICAL EXAM:  VITAL SIGNS: ED Triage Vitals  Enc Vitals Group     BP 04/13/18 1916 (!) 150/90     Pulse Rate 04/13/18 1916 (!) 113     Resp 04/13/18 1916 20     Temp 04/13/18 1916 98.2 F (36.8 C)     Temp Source 04/13/18 1916 Oral     SpO2 04/13/18 1916 96 %     Weight 04/13/18 1914 165 lb (74.8 kg)     Height 04/13/18 1914 6' (1.829 m)     Head Circumference --      Peak Flow --      Pain Score 04/13/18 1914 5     Pain Loc --      Pain Edu? --      Excl. in [place]? --     Constitutional: Alert and oriented. Well appearing and in no acute distress. Eyes: Conjunctivae are normal.  Head: Atraumatic. Nose: No congestion/rhinnorhea. Mouth/Throat: Mucous membranes are moist.  Neck: No stridor.   Cardiovascular: Normal rate, regular rhythm. Grossly normal heart sounds.   Respiratory: Normal respiratory effort.  No retractions. Lungs CTAB. Gastrointestinal: Soft  and nontender. No distention. No CVA tenderness. Musculoskeletal: No lower extremity tenderness nor edema.  No joint effusions. Neurologic:  Normal speech and language. No gross focal neurologic deficits are appreciated.  Tremulous to the bilateral upper extremities. Skin:  Skin is warm, dry and intact. No rash noted. Psychiatric: Mood and affect are normal. Speech and behavior are normal.  ____________________________________________   LABS (all labs ordered are listed, but only abnormal results are displayed)  Labs Reviewed  COMPREHENSIVE METABOLIC PANEL  ETHANOL  CBC  URINE DRUG SCREEN, QUALITATIVE ([place])    ____________________________________________  EKG  ED ECG REPORT I, Doran Stabler, the attending physician, personally viewed and interpreted this ECG.   Date: 04/13/2018  EKG Time: 1915  Rate: 115  Rhythm: sinus tachycardia  Axis: Normal  Intervals:none  ST&T Change: No ST segment elevation or depression.  No abnormal T wave inversion.  ____________________________________________  RADIOLOGY  Anterior rib fractures of the sixth and eighth rib on the left.   ____________________________________________   PROCEDURES  Procedure(s) performed:   Procedures  Critical Care performed:   ____________________________________________   INITIAL IMPRESSION / ASSESSMENT AND PLAN / ED COURSE  Pertinent labs & imaging results that were available during my care of the patient were reviewed by me and considered in my medical decision making (see chart for details).  Differential diagnosis includes, but is not limited to, ACS, aortic dissection, pulmonary embolism, cardiac tamponade, pneumothorax, pneumonia, pericarditis, myocarditis, GI-related causes including esophagitis/gastritis, and musculoskeletal chest wall pain.   As part of my medical decision making, I reviewed the following data within the [place] Notes from prior ED visits  Patient pending rib series at this time as well as lab work.  TTS consulted.   CIWA ordered.  ----------------------------------------- 11:47 PM on 04/13/2018 -----------------------------------------  Patient unable to return to inpatient detox per behavioral intake secondary to the patient just completing an inpatient detox approximately 2 weeks ago.  Persistently tachycardic despite Ativan.  Also with persistent tremor.  Will admit to the hospital.  Signed out to Dr. Jannifer Franklin.  Normal hemoglobin.  Unlikely to have splenic injury as the injury was greater than 24 hours  ago. ____________________________________________   FINAL CLINICAL IMPRESSION(S) / ED DIAGNOSES  Alcoholism.  Fall.  Rib pain fractures.  NEW MEDICATIONS STARTED DURING THIS VISIT:  New Prescriptions   No medications on file     Note:  This document was prepared using Dragon voice recognition software and may include unintentional dictation errors.     Orbie Pyo, MD 04/13/18 2004    Orbie Pyo, MD 04/13/18 2005    Orbie Pyo, MD 04/13/18 [phone]

## 2018-04-14 LAB — BASIC METABOLIC PANEL
ANION GAP: 11 (ref 5–15)
BUN: 13 mg/dL (ref 6–20)
CALCIUM: 8.1 mg/dL — AB (ref 8.9–10.3)
CO2: 28 mmol/L (ref 22–32)
Chloride: 100 mmol/L (ref 98–111)
Creatinine, Ser: 0.51 mg/dL — ABNORMAL LOW (ref 0.61–1.24)
GFR calc Af Amer: >60 mL/min (ref >60)
GFR calc non Af Amer: >60 mL/min (ref >60)
GLUCOSE: 150 mg/dL — AB (ref 70–99)
Potassium: 3.7 mmol/L (ref 3.5–5.1)
Sodium: 139 mmol/L (ref 135–145)

## 2018-04-14 LAB — GLUCOSE, CAPILLARY
GLUCOSE-CAPILLARY: 178 mg/dL — AB (ref 70–99)
GLUCOSE-CAPILLARY: 124 mg/dL — AB (ref 70–99)
Glucose-Capillary: 119 mg/dL — ABNORMAL HIGH (ref 70–99)
Glucose-Capillary: 167 mg/dL — ABNORMAL HIGH (ref 70–99)
Glucose-Capillary: 222 mg/dL — ABNORMAL HIGH (ref 70–99)

## 2018-04-14 LAB — CBC
HCT: 38.5 % — ABNORMAL LOW (ref 39.0–52.0)
HEMOGLOBIN: 13.3 g/dL (ref 13.0–17.0)
MCH: 33 pg (ref 26.0–34.0)
MCHC: 34.5 g/dL (ref 30.0–36.0)
MCV: 95.5 fL (ref 80.0–100.0)
PLATELETS: 89 10*3/uL — AB (ref 150–400)
RBC: 4.03 MIL/uL — AB (ref 4.22–5.81)
RDW: 12.5 % (ref 11.5–15.5)
WBC: 6 10*3/uL (ref 4.0–10.5)
nRBC: 0 % (ref 0.0–0.2)

## 2018-04-14 LAB — URINE DRUG SCREEN, QUALITATIVE (ARMC ONLY)
Amphetamines, Ur Screen: NOT DETECTED
BARBITURATES, UR SCREEN: NOT DETECTED
Benzodiazepine, Ur Scrn: POSITIVE — AB
CANNABINOID 50 NG, UR ~~LOC~~: NOT DETECTED
COCAINE METABOLITE, UR ~~LOC~~: NOT DETECTED
MDMA (Ecstasy)Ur Screen: NOT DETECTED
Methadone Scn, Ur: NOT DETECTED
Opiate, Ur Screen: NOT DETECTED
Phencyclidine (PCP) Ur S: NOT DETECTED
TRICYCLIC, UR SCREEN: NOT DETECTED

## 2018-04-14 MED ORDER — LORAZEPAM 1 MG PO TABS
1.0000 mg | ORAL_TABLET | ORAL | Status: DC | PRN
  Administered 2018-04-14 – 2018-04-15 (×2): 1 mg via ORAL
  Filled 2018-04-14 (×2): qty 1

## 2018-04-14 MED ORDER — ACETAMINOPHEN 325 MG PO TABS: 650.0000 mg | ORAL_TABLET | Freq: Four times a day (QID) | ORAL | Status: DC | PRN

## 2018-04-14 MED ORDER — NICOTINE POLACRILEX 2 MG MT GUM
2.0000 mg | CHEWING_GUM | OROMUCOSAL | Status: DC | PRN
  Administered 2018-04-14: 2 mg via ORAL
  Filled 2018-04-14 (×2): qty 1

## 2018-04-14 MED ORDER — LORAZEPAM 2 MG/ML IJ SOLN: 0.0000 mg | Freq: Two times a day (BID) | INTRAMUSCULAR | Status: DC

## 2018-04-14 MED ORDER — LORAZEPAM 2 MG/ML IJ SOLN
0.0000 mg | Freq: Four times a day (QID) | INTRAMUSCULAR | Status: DC
  Administered 2018-04-14: 1 mg via INTRAVENOUS
  Administered 2018-04-14 (×4): 2 mg via INTRAVENOUS
  Administered 2018-04-15: 1 mg via INTRAVENOUS
  Filled 2018-04-14 (×2): qty 1
  Filled 2018-04-14: qty 2
  Filled 2018-04-14 (×3): qty 1

## 2018-04-14 MED ORDER — SODIUM CHLORIDE 0.9 % IV SOLN
INTRAVENOUS | Status: DC
  Administered 2018-04-14 – 2018-04-15 (×3): via INTRAVENOUS

## 2018-04-14 MED ORDER — LORAZEPAM 2 MG/ML IJ SOLN: 1.0000 mg | INTRAMUSCULAR | Status: DC | PRN

## 2018-04-14 MED ORDER — LORAZEPAM 1 MG PO TABS: 1.0000 mg | ORAL_TABLET | Freq: Four times a day (QID) | ORAL | Status: DC | PRN

## 2018-04-14 MED ORDER — LORAZEPAM 2 MG/ML IJ SOLN: 1.0000 mg | Freq: Four times a day (QID) | INTRAMUSCULAR | Status: DC | PRN

## 2018-04-14 MED ORDER — ENOXAPARIN SODIUM 40 MG/0.4ML ~~LOC~~ SOLN
40.0000 mg | SUBCUTANEOUS | Status: DC
  Administered 2018-04-14: 40 mg via SUBCUTANEOUS
  Filled 2018-04-14: qty 0.4

## 2018-04-14 MED ORDER — CHLORDIAZEPOXIDE HCL 10 MG PO CAPS
10.0000 mg | ORAL_CAPSULE | Freq: Four times a day (QID) | ORAL | Status: DC
  Administered 2018-04-14: 10 mg via ORAL
  Filled 2018-04-14: qty 1

## 2018-04-14 MED ORDER — INSULIN ASPART 100 UNIT/ML ~~LOC~~ SOLN
0.0000 [IU] | Freq: Three times a day (TID) | SUBCUTANEOUS | Status: DC
  Administered 2018-04-14 (×2): 2 [IU] via SUBCUTANEOUS
  Filled 2018-04-14 (×2): qty 1

## 2018-04-14 MED ORDER — CHLORDIAZEPOXIDE HCL 25 MG PO CAPS
25.0000 mg | ORAL_CAPSULE | Freq: Four times a day (QID) | ORAL | Status: DC
  Administered 2018-04-14 – 2018-04-15 (×4): 25 mg via ORAL
  Filled 2018-04-14 (×4): qty 1

## 2018-04-14 MED ORDER — ONDANSETRON HCL 4 MG/2ML IJ SOLN: 4.0000 mg | Freq: Four times a day (QID) | INTRAMUSCULAR | Status: DC | PRN

## 2018-04-14 MED ORDER — NICOTINE 21 MG/24HR TD PT24
21.0000 mg | MEDICATED_PATCH | Freq: Every day | TRANSDERMAL | Status: DC
  Administered 2018-04-14 – 2018-04-15 (×2): 21 mg via TRANSDERMAL
  Filled 2018-04-14 (×2): qty 1

## 2018-04-14 MED ORDER — THIAMINE HCL 100 MG/ML IJ SOLN: 100.0000 mg | Freq: Every day | INTRAMUSCULAR | Status: DC

## 2018-04-14 MED ORDER — FOLIC ACID 1 MG PO TABS
1.0000 mg | ORAL_TABLET | Freq: Every day | ORAL | Status: DC
  Administered 2018-04-14 – 2018-04-15 (×2): 1 mg via ORAL
  Filled 2018-04-14 (×2): qty 1

## 2018-04-14 MED ORDER — VITAMIN B-1 100 MG PO TABS
100.0000 mg | ORAL_TABLET | Freq: Every day | ORAL | Status: DC
  Administered 2018-04-14 – 2018-04-15 (×2): 100 mg via ORAL
  Filled 2018-04-14 (×2): qty 1

## 2018-04-14 MED ORDER — ONDANSETRON HCL 4 MG PO TABS: 4.0000 mg | ORAL_TABLET | Freq: Four times a day (QID) | ORAL | Status: DC | PRN

## 2018-04-14 MED ORDER — CHLORDIAZEPOXIDE HCL 25 MG PO CAPS
50.0000 mg | ORAL_CAPSULE | Freq: Once | ORAL | Status: AC
  Administered 2018-04-14: 50 mg via ORAL
  Filled 2018-04-14: qty 2

## 2018-04-14 MED ORDER — ACETAMINOPHEN 650 MG RE SUPP: 650.0000 mg | Freq: Four times a day (QID) | RECTAL | Status: DC | PRN

## 2018-04-14 MED ORDER — INSULIN ASPART 100 UNIT/ML ~~LOC~~ SOLN: 0.0000 [IU] | Freq: Every day | SUBCUTANEOUS | Status: DC

## 2018-04-14 MED ORDER — ADULT MULTIVITAMIN W/MINERALS CH
1.0000 | ORAL_TABLET | Freq: Every day | ORAL | Status: DC
  Administered 2018-04-14 – 2018-04-15 (×2): 1 via ORAL
  Filled 2018-04-14 (×2): qty 1

## 2018-04-14 NOTE — Care Management (Signed)
Has scored a high of 11 on CIWA. Patient is not able to discharge to [facility] as he just completed detox 2 weeks prior.   He will be provided with outpatient resources.  CM will need to watch for possible assist with meds at discharge

## 2018-04-14 NOTE — Progress Notes (Signed)
Advance care planning  Purpose of Encounter Alcohol abuse and CODE STATUS discussion  Parties in Attendance Patient  Patients Decisional capacity Patient is alert and oriented today.  Able to make medical decisions  Discussed regarding patient's alcohol abuse/withdrawals at this time.  Treatment plan and prognosis.  Patient has no documented healthcare power of attorney.  He tells me he wants his ex-wife to be making decisions if he cannot.  Encouraged him to have documentation completed while he is in the hospital.  No advance directives in place.  But he wants to be resuscitated and intubated if needed.  Full code orders entered  Time spent - 17 minutes

## 2018-04-14 NOTE — Clinical Social Work Note (Signed)
Clinical Social Work Assessment  Patient Details  Name: Shaun Roberts MRN: [id number] Date of Birth: 1966/03/30  Date of referral:  04/14/18               Reason for consult:  Substance Use/ETOH Abuse                Permission sought to share information with:  Case Manager, Customer service manager, Family Supports Permission granted to share information::  Yes, Verbal Permission Granted  Name::        Agency::     Relationship::     Contact Information:     Housing/Transportation Living arrangements for the past 2 months:  Single Family Home Source of Information:  Patient Patient Interpreter Needed:  None Criminal Activity/Legal Involvement Pertinent to Current Situation/Hospitalization:  No - Comment as needed Significant Relationships:  Adult Children Lives with:  Self Do you feel safe going back to the place where you live?  Yes Need for family participation in patient care:  Yes (Comment)  Care giving concerns:  Patient lives alone in [place] Worker assessment / plan:  CSW consulted for ETOH abuse. CSW met with patient and son Dionis Autry at bedside. CSW introduced self and explained role. CSW inquired about drinking habits. Patient states that he lives alone and usually drinks about a gallon of Vodka a day. Patient states that he has been to outpatient and inpatient substance treatment in the past. Patient reports that he would like to get some help and CSW gave resources for outpatient treatment. Patient states that he used to have a job but he was fired recently. Patient plans to return home and seek outpatient ETOH treatment. CSW signing off. Please re consult if further needs arise.   Employment status:  Unemployed Forensic scientist:  Managed Care PT Recommendations:    Information / Referral to community resources:  Outpatient Substance Abuse Treatment Options  Patient/Family's Response to care:  Patient thanked CSW for assistance    Patient/Family's Understanding of and Emotional Response to Diagnosis, Current Treatment, and Prognosis:  Patient in agreement with discharge plan   Emotional Assessment Appearance:  Appears stated age Attitude/Demeanor/Rapport:    Affect (typically observed):  Accepting, Appropriate Orientation:  Oriented to Self, Oriented to Place, Oriented to  Time, Oriented to Situation Alcohol / Substance use:  Not Applicable Psych involvement (Current and /or in the community):  No (Comment)  Discharge Needs  Concerns to be addressed:  Substance Abuse Concerns Readmission within the last 30 days:  No Current discharge risk:  Substance Abuse Barriers to Discharge:  Continued Medical Work up   [employer], [place] 04/14/2018, 4:26 PM

## 2018-04-14 NOTE — Progress Notes (Signed)
[place] at [place] NAME: Shaun Roberts    MR#:  [id number]  DATE OF BIRTH:  [birth year]  SUBJECTIVE:  CHIEF COMPLAINT:   Chief Complaint  Patient presents with  . Alcohol Problem   Left chest pain Feels fatigued  REVIEW OF SYSTEMS:    Review of Systems  Constitutional: Positive for malaise/fatigue. Negative for chills and fever.  HENT: Negative for sore throat.   Eyes: Negative for blurred vision, double vision and pain.  Respiratory: Positive for shortness of breath. Negative for cough, hemoptysis and wheezing.   Cardiovascular: Positive for chest pain. Negative for palpitations, orthopnea and leg swelling.  Gastrointestinal: Negative for abdominal pain, constipation, diarrhea, heartburn, nausea and vomiting.  Genitourinary: Negative for dysuria and hematuria.  Musculoskeletal: Negative for back pain and joint pain.  Skin: Negative for rash.  Neurological: Negative for sensory change, speech change, focal weakness and headaches.  Endo/Heme/Allergies: Does not bruise/bleed easily.  Psychiatric/Behavioral: Negative for depression. The patient is not nervous/anxious.     DRUG ALLERGIES:  No Known Allergies  VITALS:  Blood pressure (!) 136/96, pulse 96, temperature 98 F (36.7 C), temperature source Oral, resp. rate 19, height 6' (1.829 m), weight 74.6 kg, SpO2 97 %.  PHYSICAL EXAMINATION:   Physical Exam  GENERAL:  51 y.o.-year-old patient lying in the bed with no acute distress. Tremulous  EYES: Pupils equal, round, reactive to light and accommodation. No scleral icterus. Extraocular muscles intact.  HEENT: Head atraumatic, normocephalic. Oropharynx and nasopharynx clear.  NECK:  Supple, no jugular venous distention. No thyroid enlargement, no tenderness.  LUNGS: Normal breath sounds bilaterally, no wheezing, rales, rhonchi. No use of accessory muscles of respiration.  CARDIOVASCULAR: S1, S2 normal. No murmurs, rubs, or  gallops.  ABDOMEN: Soft, nontender, nondistended. Bowel sounds present. No organomegaly or mass.  EXTREMITIES: No cyanosis, clubbing or edema b/l.    NEUROLOGIC: Cranial nerves II through XII are intact. No focal Motor or sensory deficits b/l.   PSYCHIATRIC: The patient is alert and oriented x 3. Anxious SKIN: No obvious rash, lesion, or ulcer.   LABORATORY PANEL:   CBC Recent Labs  Lab 04/14/18 0448  WBC 6.0  HGB 13.3  HCT 38.5*  PLT 89*   ------------------------------------------------------------------------------------------------------------------ Chemistries  Recent Labs  Lab 04/13/18 1916 04/14/18 0448  NA 137 139  K 3.6 3.7  CL 94* 100  CO2 24 28  GLUCOSE 249* 150*  BUN 15 13  CREATININE 0.54* 0.51*  CALCIUM 8.8* 8.1*  AST 65*  --   ALT 82*  --   ALKPHOS 53  --   BILITOT 1.3*  --    ------------------------------------------------------------------------------------------------------------------ Cardiac Enzymes No results for input(s): TROPONINI in the last 168 hours. ------------------------------------------------------------------------------------------------------------------ RADIOLOGY:  Dg Ribs Unilateral W/chest Left  Result Date: 04/13/2018 CLINICAL DATA:  Anterior left chest pain after a fall. EXAM: LEFT RIBS AND CHEST - 3+ VIEW COMPARISON:  None. FINDINGS: The lungs appear clear.  Cardiac and mediastinal contours normal. No pleural effusion identified. No pulmonary contusion or pneumothorax. Suspected nondisplaced rib fractures of the left anterior sixth and eighth ribs. IMPRESSION: Nondisplaced anterior fractures left sixth and eighth ribs. Electronically Signed   By: Van Clines M.D.   On: 04/13/2018 20:54   ASSESSMENT AND PLAN:   *  Alcohol withdrawal Continues to have tremors and significant anxiety.  Will increase dose of Librium.  Ativan PRN.  Alcohol withdrawal protocol.  Thiamine.  IV fluids.   * Rib fractures -incentive  spirometry, PRN analgesia  * COPD -continue home meds   * Diabetes  -sliding scale insulin with corresponding glucose checks  *Mild thrombocytopenia likely due to alcohol.  Monitor.  No bleeding.  All the records are reviewed and case discussed with Care Management/Social Worker Management plans discussed with the patient, family and they are in agreement.  CODE STATUS: FULL CODE  DVT Prophylaxis: SCDs  TOTAL TIME TAKING CARE OF THIS PATIENT: 35 minutes.   POSSIBLE D/C IN 1-2 DAYS, DEPENDING ON CLINICAL CONDITION.  Neita Carp M.D on 04/14/2018 at 12:44 PM  Between 7am to 6pm - Pager - [phone]  After 6pm go to [email or web address] - password [email or web address] [facility]  Office  [phone]  CC: Primary care physician; Patient, No Pcp Per  Note: This dictation was prepared with Dragon dictation along with smaller phrase technology. Any transcriptional errors that result from this process are unintentional.

## 2018-04-15 LAB — GLUCOSE, CAPILLARY: Glucose-Capillary: 120 mg/dL — ABNORMAL HIGH (ref 70–99)

## 2018-04-15 LAB — HIV ANTIBODY (ROUTINE TESTING W REFLEX): HIV Screen 4th Generation wRfx: NONREACTIVE

## 2018-04-15 MED ORDER — CHLORDIAZEPOXIDE HCL 10 MG PO CAPS: 10.0000 mg | ORAL_CAPSULE | Freq: Three times a day (TID) | ORAL | 0 refills | Status: DC

## 2018-04-15 NOTE — Progress Notes (Signed)
Discharge instructions given to patient, verbalized understanding. Son transporting patient home.

## 2018-04-15 NOTE — Plan of Care (Signed)
  Problem: Education: Goal: Knowledge of General Education information will improve Description Including pain rating scale, medication(s)/side effects and non-pharmacologic comfort measures Outcome: Progressing   Problem: Health Behavior/Discharge Planning: Goal: Ability to manage health-related needs will improve Outcome: Progressing   

## 2018-05-01 NOTE — Discharge Summary (Signed)
[facility] at [place] NAME: Shaun Roberts    MR#:  [id number]  DATE OF BIRTH:  1966/03/07  DATE OF ADMISSION:  04/13/2018 ADMITTING PHYSICIAN: Lance Coon, MD  DATE OF DISCHARGE: 04/15/2018 10:45 AM  PRIMARY CARE PHYSICIAN: System, Pcp Not In   ADMISSION DIAGNOSIS:  Alcoholism ([place]) [F10.20] Rib pain [R07.81] Fall, initial encounter [W19.XXXA] Alcohol withdrawal syndrome without complication (H[facility]) [E42.353] Closed fracture of multiple ribs of left side, initial encounter [S22.42XA] Alcohol withdrawal ([place]) [F10.239]  DISCHARGE DIAGNOSIS:  Principal Problem:   Alcohol withdrawal ([place]) Active Problems:   Rib fractures   COPD (chronic obstructive pulmonary disease) ([place])   Diabetes ([place])   SECONDARY DIAGNOSIS:   Past Medical History:  Diagnosis Date  . Alcohol abuse   . COPD (chronic obstructive pulmonary disease) ([place])   . Diabetes mellitus without complication ([place])      ADMITTING HISTORY  HISTORY OF PRESENT ILLNESS:  Shaun Roberts  is a 52 y.o. male who presents with chief complaint as above.  Patient presents due to alcohol withdrawal.  Here he was found to have several rib fractures from a fall that occurred 2 days ago.  He just completed detox program, but began drinking again very soon after discharge.  He states that he drinks half a gallon of tequila daily.  He certainly has symptoms of withdrawal here in the ED.  Hospitalist were called for admission  HOSPITAL COURSE:   *Alcohol withdrawal Patient was admitted to medical floor with telemetry monitoring.  Started on scheduled Librium and Ativan as needed along with CIWA protocol.  Patient slowly improved and by day of discharge his withdrawals have improved significantly.  He has been given outpatient resources for alcohol rehab.  Discharge home with family.  *Recent rib fractures.  Incentive spirometry.  Pain medications as needed.  COPD and diabetes mellitus well  controlled during the hospital stay  Mild thrombocytopenia due to alcohol.  No bleeding found.  Stable.  Patient will be discharged home.  Follow-up with primary care physician within 1 week.  CONSULTS OBTAINED:    DRUG ALLERGIES:  No Known Allergies  DISCHARGE MEDICATIONS:   Allergies as of 04/15/2018   No Known Allergies     Medication List    TAKE these medications   albuterol 108 (90 Base) MCG/ACT inhaler Commonly known as:  PROVENTIL HFA;VENTOLIN HFA Inhale 1-2 puffs into the lungs every 6 (six) hours as needed for wheezing or shortness of breath.   chlordiazePOXIDE 10 MG capsule Commonly known as:  LIBRIUM Take 1 capsule (10 mg total) by mouth 3 (three) times daily.   famotidine 20 MG tablet Commonly known as:  PEPCID Take 20 mg by mouth 2 (two) times daily.   folic acid 1 MG tablet Commonly known as:  FOLVITE Take 1 mg by mouth daily.   hydrOXYzine 25 MG capsule Commonly known as:  VISTARIL Take 25 mg by mouth every 6 (six) hours as needed for anxiety.   metFORMIN 1000 MG tablet Commonly known as:  GLUCOPHAGE Take 1,000 mg by mouth 2 (two) times daily with a meal.   nicotine 21 mg/24hr patch Commonly known as:  NICODERM CQ - dosed in mg/24 hours Place 21 mg onto the skin daily.   thiamine 100 MG tablet Commonly known as:  VITAMIN B-1 Take 100 mg by mouth daily.   traZODone 150 MG tablet Commonly known as:  DESYREL Take 75 mg by mouth at bedtime as needed for sleep.  Today   VITAL SIGNS:  Blood pressure (!) 142/94, pulse 89, temperature 98.2 F (36.8 C), temperature source Oral, resp. rate 18, height 6' (1.829 m), weight 74.6 kg, SpO2 99 %.  I/O:  No intake or output data in the 24 hours ending 05/01/18 1238  PHYSICAL EXAMINATION:  Physical Exam  GENERAL:  52 y.o.-year-old patient lying in the bed with no acute distress.  LUNGS: Normal breath sounds bilaterally, no wheezing, rales,rhonchi or crepitation. No use of accessory muscles  of respiration.  CARDIOVASCULAR: S1, S2 normal. No murmurs, rubs, or gallops.  ABDOMEN: Soft, non-tender, non-distended. Bowel sounds present. No organomegaly or mass.  NEUROLOGIC: Moves all 4 extremities. PSYCHIATRIC: The patient is alert and oriented x 3.  SKIN: No obvious rash, lesion, or ulcer.   DATA REVIEW:   CBC No results for input(s): WBC, HGB, HCT, PLT in the last 168 hours.  Chemistries  No results for input(s): NA, K, CL, CO2, GLUCOSE, BUN, CREATININE, CALCIUM, MG, AST, ALT, ALKPHOS, BILITOT in the last 168 hours.  Invalid input(s): GFRCGP  Cardiac Enzymes No results for input(s): TROPONINI in the last 168 hours.  Microbiology Results  No results found for this or any previous visit.  RADIOLOGY:  No results found.  Follow up with PCP in 1 week.  Management plans discussed with the patient, family and they are in agreement.  CODE STATUS:  Code Status History    Date Active Date Inactive Code Status Order ID Comments User Context   04/14/2018 0023 04/15/2018 1358 Full Code [id number]  Lance Coon, MD ED      TOTAL TIME TAKING CARE OF THIS PATIENT ON DAY OF DISCHARGE: more than 30 minutes.   Neita Carp M.D on 05/01/2018 at 12:38 PM  Between 7am to 6pm - Pager - [phone]  After 6pm go to [email or web address] - password [email or web address] [facility]  Office  [phone]  CC: Primary care physician; System, Pcp Not In  Note: This dictation was prepared with Dragon dictation along with smaller phrase technology. Any transcriptional errors that result from this process are unintentional.

## 2018-05-04 ENCOUNTER — Encounter: Payer: Self-pay | Admitting: Primary Care

## 2018-05-04 ENCOUNTER — Ambulatory Visit (INDEPENDENT_AMBULATORY_CARE_PROVIDER_SITE_OTHER): Payer: 59 | Admitting: Primary Care

## 2018-05-04 VITALS — BP 116/74 | HR 95 | Temp 98.3°F | Ht 70.5 in | Wt 162.2 lb

## 2018-05-04 DIAGNOSIS — E119 Type 2 diabetes mellitus without complications: Secondary | ICD-10-CM | POA: Diagnosis not present

## 2018-05-04 DIAGNOSIS — M65351 Trigger finger, right little finger: Secondary | ICD-10-CM

## 2018-05-04 DIAGNOSIS — F10239 Alcohol dependence with withdrawal, unspecified: Secondary | ICD-10-CM | POA: Diagnosis not present

## 2018-05-04 DIAGNOSIS — Z125 Encounter for screening for malignant neoplasm of prostate: Secondary | ICD-10-CM

## 2018-05-04 DIAGNOSIS — Z23 Encounter for immunization: Secondary | ICD-10-CM

## 2018-05-04 DIAGNOSIS — M6530 Trigger finger, unspecified finger: Secondary | ICD-10-CM | POA: Insufficient documentation

## 2018-05-04 DIAGNOSIS — F1021 Alcohol dependence, in remission: Secondary | ICD-10-CM | POA: Insufficient documentation

## 2018-05-04 DIAGNOSIS — F1010 Alcohol abuse, uncomplicated: Secondary | ICD-10-CM | POA: Insufficient documentation

## 2018-05-04 DIAGNOSIS — J449 Chronic obstructive pulmonary disease, unspecified: Secondary | ICD-10-CM | POA: Diagnosis not present

## 2018-05-04 DIAGNOSIS — F10939 Alcohol use, unspecified with withdrawal, unspecified: Secondary | ICD-10-CM

## 2018-05-04 LAB — COMPREHENSIVE METABOLIC PANEL
ALBUMIN: 4.7 g/dL (ref 3.5–5.2)
ALK PHOS: 64 U/L (ref 39–117)
ALT: 31 U/L (ref 0–53)
AST: 21 U/L (ref 0–37)
BUN: 17 mg/dL (ref 6–23)
CALCIUM: 10 mg/dL (ref 8.4–10.5)
CHLORIDE: 102 meq/L (ref 96–112)
CO2: 27 mEq/L (ref 19–32)
Creatinine, Ser: 0.73 mg/dL (ref 0.40–1.50)
GFR: 119.14 mL/min (ref >60.00)
Glucose, Bld: 161 mg/dL — ABNORMAL HIGH (ref 70–99)
Potassium: 4.7 mEq/L (ref 3.5–5.1)
Sodium: 137 mEq/L (ref 135–145)
TOTAL PROTEIN: 7.2 g/dL (ref 6.0–8.3)
Total Bilirubin: 0.6 mg/dL (ref 0.2–1.2)

## 2018-05-04 LAB — PSA: PSA: 0.64 ng/mL (ref 0.10–4.00)

## 2018-05-04 LAB — HEMOGLOBIN A1C: HEMOGLOBIN A1C: 6.8 % — AB (ref 4.6–6.5)

## 2018-05-04 LAB — LIPID PANEL
CHOLESTEROL: 155 mg/dL (ref 0–200)
HDL: 48.3 mg/dL (ref >39.00)
LDL CALC: 91 mg/dL (ref 0–99)
NonHDL: 106.78
TRIGLYCERIDES: 81 mg/dL (ref 0.0–149.0)
Total CHOL/HDL Ratio: 3
VLDL: 16.2 mg/dL (ref 0.0–40.0)

## 2018-05-04 LAB — MICROALBUMIN / CREATININE URINE RATIO
Creatinine,U: 118 mg/dL
MICROALB/CREAT RATIO: 4.4 mg/g (ref 0.0–30.0)
Microalb, Ur: 5.2 mg/dL — ABNORMAL HIGH (ref 0.0–1.9)

## 2018-05-04 NOTE — Addendum Note (Signed)
Addended by: Jacqualin Combes on: 05/04/2018 01:23 PM   Modules accepted: Orders

## 2018-05-04 NOTE — Assessment & Plan Note (Signed)
Admitted to [facility] hospital for alcoholism and alcohol withdrawal.  Overall doing well since discharge and denies consumption of alcohol since.  Check LFTs today.  Continue current medication regimen as needed.  Hospital labs and notes reviewed.

## 2018-05-04 NOTE — Assessment & Plan Note (Signed)
Recent admission.  Endorses sobriety since prior to admission, commended him on this.  Strongly advised to get connected with alcoholics anonymous program and detox program if needed.  LFTs pending today.

## 2018-05-04 NOTE — Patient Instructions (Addendum)
Stop by the lab prior to leaving today. I will notify you of your results once received.   Please schedule a physical with me before the end of the year. No need to schedule a lab appointment.  You were provided with a flu shot and pneumonia shot.   It was a pleasure to see you today!

## 2018-05-04 NOTE — Assessment & Plan Note (Signed)
Lungs clear on exam today.  Using albuterol inhaler infrequently.

## 2018-05-04 NOTE — Assessment & Plan Note (Signed)
Evident to bilateral fifth digits.  He currently declines hand surgeon referral at this time.

## 2018-05-04 NOTE — Assessment & Plan Note (Signed)
Compliant to metformin twice daily.  Continue same. A1c pending today. Lipid panel and urine microalbumin also pending. We will likely need to initiate statin treatment given history. Foot exam next visit. Pneumonia vaccination provided today.  Follow-up in 6 months for diabetes check.

## 2018-05-04 NOTE — Progress Notes (Signed)
Subjective:    Patient ID: Shaun Roberts, male    DOB: 1965/07/10, 52 y.o.   MRN: [id number]  HPI  Shaun Roberts is a 52 year old male who presents today to establish care and discuss the problems mentioned below. Will obtain old records.  1) Alcohol/Drug Abuse: History of alcohol abuse since the age of 61, drinks 1/2 gallon of Vodka every 2 days. Voluntary admission to rehab with last admission being 2-3 months ago.   Recent hospital admission for alcoholism, rib fractures, alcohol withdrawal dating April 13, 2018 through April 15, 2018.  He endorsed that he relapsed after completing a detox program several days prior to his admission.  He was treated under CIWA protocol.  Rib fractures were found after he reported falling several days prior.  He was provided with incentive spirometry.  He was discharged home with resources for alcohol rehab.  Today he endorses that he has not had alcohol since prior to his recent hospital admission.  He completed his course of Librium.  He is taking hydroxyzine 2-3 times daily, sometimes not taking any.  He is using the trazodone, 1/2 tablet, at bedtime for sleep.  He has not yet gotten connected with Alcoholics Anonymous as he lacks transportation.  He does plan on connecting with them soon.  2) Type 2 Diabetes: Diagnosed several years ago. Currently managed on metformin 1000 mg BID for which he's taken off and on for years. He's been taking his Metformin mostly daily since his last rehab stay 2-3 months ago. No recent A1C on file. He was once managed on statin medication but has been off for several years ago.   3) COPD: Currently managed on albuterol HFA for which he uses infrequently. He denies exertional shortness of breath.   BP Readings from Last 3 Encounters:  05/04/18 116/74  04/15/18 (!) 142/94   4) Trigger Finger: Chronic to right and left 5th digits for years. He believes his symptoms are worse.  He has not sought treatment for this in the  past and is not interested at this time.  Review of Systems  Respiratory: Negative for shortness of breath.   Cardiovascular: Negative for chest pain.  Musculoskeletal: Positive for myalgias.       Chronic trigger finger  Neurological: Negative for dizziness, numbness and headaches.       Past Medical History:  Diagnosis Date  . Alcohol abuse   . COPD (chronic obstructive pulmonary disease) ([place])   . Depression   . Diabetes mellitus without complication [facility])      Social History   Socioeconomic History  . Marital status: Divorced    Spouse name: Not on file  . Number of children: Not on file  . Years of education: Not on file  . Highest education level: Not on file  Occupational History  . Not on file  Social Needs  . Financial resource strain: Not on file  . Food insecurity:    Worry: Not on file    Inability: Not on file  . Transportation needs:    Medical: Not on file    Non-medical: Not on file  Tobacco Use  . Smoking status: Current Every Day Smoker  . Smokeless tobacco: Never Used  Substance and Sexual Activity  . Alcohol use: Yes    Comment: see notes  . Drug use: Not on file  . Sexual activity: Not on file  Lifestyle  . Physical activity:    Days per week: Not on file  Minutes per session: Not on file  . Stress: Not on file  Relationships  . Social connections:    Talks on phone: Not on file    Gets together: Not on file    Attends religious service: Not on file    Active member of club or organization: Not on file    Attends meetings of clubs or organizations: Not on file    Relationship status: Not on file  . Intimate partner violence:    Fear of current or ex partner: Not on file    Emotionally abused: Not on file    Physically abused: Not on file    Forced sexual activity: Not on file  Other Topics Concern  . Not on file  Social History Narrative  . Not on file    Past Surgical History:  Procedure Laterality Date  . ANTERIOR  CRUCIATE LIGAMENT REPAIR Right 2016  . KNEE RECONSTRUCTION    . MEDIAL COLLATERAL LIGAMENT AND LATERAL COLLATERAL LIGAMENT REPAIR, KNEE Right 2016    Family History  Problem Relation Age of Onset  . Depression Mother   . Diabetes Mother   . Early death Mother   . Kidney disease Mother   . HIV/AIDS Mother   . Alcohol abuse Father   . Diabetes Father   . Early death Father   . Leukemia Father   . Depression Sister   . Diabetes Sister   . Drug abuse Sister   . Early death Sister   . Cancer Sister   . Early death Paternal Grandfather   . Heart attack Paternal Grandfather   . Heart disease Paternal Grandfather     No Known Allergies  Current Outpatient Medications on File Prior to Visit  Medication Sig Dispense Refill  . hydrOXYzine (VISTARIL) 25 MG capsule Take 25 mg by mouth every 6 (six) hours as needed for anxiety.    . metFORMIN (GLUCOPHAGE) 1000 MG tablet Take 1,000 mg by mouth 2 (two) times daily with a meal.    . traZODone (DESYREL) 150 MG tablet Take 75 mg by mouth at bedtime as needed for sleep.    Marland Kitchen albuterol (PROVENTIL HFA;VENTOLIN HFA) 108 (90 Base) MCG/ACT inhaler Inhale 1-2 puffs into the lungs every 6 (six) hours as needed for wheezing or shortness of breath.    . famotidine (PEPCID) 20 MG tablet Take 20 mg by mouth 2 (two) times daily.     No current facility-administered medications on file prior to visit.     BP 116/74   Pulse 95   Temp 98.3 F (36.8 C) (Oral)   Ht 5' 10.5" (1.791 m)   Wt 162 lb 4 oz (73.6 kg)   SpO2 97%   BMI 22.95 kg/m    Objective:   Physical Exam  Constitutional: He appears well-nourished.  Neck: Neck supple.  Cardiovascular: Normal rate and regular rhythm.  Respiratory: Effort normal and breath sounds normal.  Musculoskeletal:  Severe contracture of right fifth digit, minor contracture of left fifth digit.  Skin: Skin is warm and dry.  Psychiatric: He has a normal mood and affect.           Assessment & Plan:

## 2018-05-05 ENCOUNTER — Encounter: Payer: Self-pay | Admitting: Primary Care

## 2018-05-05 ENCOUNTER — Telehealth: Payer: Self-pay | Admitting: Primary Care

## 2018-05-05 DIAGNOSIS — M65351 Trigger finger, right little finger: Secondary | ICD-10-CM

## 2018-05-05 NOTE — Telephone Encounter (Signed)
Noted  Referral placed.

## 2018-05-05 NOTE — Telephone Encounter (Signed)
Pt called to let you know he has decided to go ahead with the hand surgery and would like a referral. He does not have a preference of where to go.

## 2018-05-10 ENCOUNTER — Ambulatory Visit (INDEPENDENT_AMBULATORY_CARE_PROVIDER_SITE_OTHER): Payer: 59 | Admitting: Primary Care

## 2018-05-10 ENCOUNTER — Encounter: Payer: Self-pay | Admitting: Primary Care

## 2018-05-10 VITALS — BP 122/84 | HR 94 | Temp 97.9°F | Ht 70.5 in | Wt 162.0 lb

## 2018-05-10 DIAGNOSIS — Z0000 Encounter for general adult medical examination without abnormal findings: Secondary | ICD-10-CM

## 2018-05-10 DIAGNOSIS — J449 Chronic obstructive pulmonary disease, unspecified: Secondary | ICD-10-CM

## 2018-05-10 DIAGNOSIS — Z1211 Encounter for screening for malignant neoplasm of colon: Secondary | ICD-10-CM

## 2018-05-10 DIAGNOSIS — Z720 Tobacco use: Secondary | ICD-10-CM | POA: Insufficient documentation

## 2018-05-10 DIAGNOSIS — E119 Type 2 diabetes mellitus without complications: Secondary | ICD-10-CM

## 2018-05-10 DIAGNOSIS — M65351 Trigger finger, right little finger: Secondary | ICD-10-CM

## 2018-05-10 MED ORDER — ALBUTEROL SULFATE HFA 108 (90 BASE) MCG/ACT IN AERS: 1.0000 | INHALATION_SPRAY | Freq: Four times a day (QID) | RESPIRATORY_TRACT | 0 refills | Status: DC | PRN

## 2018-05-10 MED ORDER — ATORVASTATIN CALCIUM 10 MG PO TABS: 10.0000 mg | ORAL_TABLET | Freq: Every day | ORAL | 3 refills | Status: DC

## 2018-05-10 MED ORDER — VARENICLINE TARTRATE 1 MG PO TABS: 1.0000 mg | ORAL_TABLET | Freq: Two times a day (BID) | ORAL | 0 refills | Status: DC

## 2018-05-10 MED ORDER — VARENICLINE TARTRATE 0.5 MG X 11 & 1 MG X 42 PO MISC: ORAL | 0 refills | Status: DC

## 2018-05-10 MED ORDER — METFORMIN HCL 1000 MG PO TABS: 1000.0000 mg | ORAL_TABLET | Freq: Two times a day (BID) | ORAL | 3 refills | Status: DC

## 2018-05-10 NOTE — Assessment & Plan Note (Signed)
Immunizations UTD. PSA completed and unremarkable. Colonoscopy due, referral placed. Recommended regular exercise, healthy diet. Exam stable.  Labs reviewed. Follow up in 1 year for CPE.

## 2018-05-10 NOTE — Assessment & Plan Note (Signed)
Infrequent use of albuterol inhaler, refill sent to pharmacy as his last inhaler has expired.

## 2018-05-10 NOTE — Progress Notes (Signed)
Subjective:    Patient ID: Naitik Hermann, male    DOB: 10/13/1965, 52 y.o.   MRN: [id number]  HPI  Mr. Livesay is a 52 year old male who presents today for complete physical.  He is ready to stop smoking and would like to try Chantix again. He's been on Chantix twice in the past which helped, wasn't able to quit as he never picked the refill. He has never had problems on Chantix in the past. He currently smokes 1 PPD.   Immunizations: -Tetanus: Completed in 2015 -Influenza: Completed this season -Pneumonia: Completed in 2019   Diet: He endorses a fair diet. Breakfast: Skips Lunch: Left overs, some fast food Dinner: Meat, vegetable, starch  Snacks: Chips, cookies Desserts: Daily  Beverages: Coffee, water  Exercise: He is not exercising Eye exam: Completed in 2018 Dental exam: No recent exam Colonoscopy: Last completed in 2005, declines due to losing insurance in early 2020 PSA: 0.64 on recent labs   Review of Systems  Constitutional: Negative for unexpected weight change.  HENT: Negative for rhinorrhea.   Respiratory: Negative for cough and shortness of breath.   Cardiovascular: Negative for chest pain.  Gastrointestinal: Negative for constipation and diarrhea.  Genitourinary: Negative for difficulty urinating.  Musculoskeletal:       Chronic right lower extremity and back pain  Skin: Negative for rash.  Allergic/Immunologic: Negative for environmental allergies.  Neurological: Negative for dizziness, numbness and headaches.  Psychiatric/Behavioral: The patient is not nervous/anxious.        Past Medical History:  Diagnosis Date  . Alcohol abuse   . Arthritis   . Colon polyps   . COPD (chronic obstructive pulmonary disease) ([place])   . Depression   . Diabetes mellitus without complication ([place])   . Hyperlipidemia   . Hypertension      Social History   Socioeconomic History  . Marital status: Divorced    Spouse name: Not on file  . Number of children: Not  on file  . Years of education: Not on file  . Highest education level: Not on file  Occupational History  . Not on file  Social Needs  . Financial resource strain: Not on file  . Food insecurity:    Worry: Not on file    Inability: Not on file  . Transportation needs:    Medical: Not on file    Non-medical: Not on file  Tobacco Use  . Smoking status: Current Every Day Smoker  . Smokeless tobacco: Never Used  Substance and Sexual Activity  . Alcohol use: Yes    Comment: see notes  . Drug use: Not on file  . Sexual activity: Not on file  Lifestyle  . Physical activity:    Days per week: Not on file    Minutes per session: Not on file  . Stress: Not on file  Relationships  . Social connections:    Talks on phone: Not on file    Gets together: Not on file    Attends religious service: Not on file    Active member of club or organization: Not on file    Attends meetings of clubs or organizations: Not on file    Relationship status: Not on file  . Intimate partner violence:    Fear of current or ex partner: Not on file    Emotionally abused: Not on file    Physically abused: Not on file    Forced sexual activity: Not on file  Other Topics Concern  .  Not on file  Social History Narrative  . Not on file    Past Surgical History:  Procedure Laterality Date  . ANTERIOR CRUCIATE LIGAMENT REPAIR Right 2016  . KNEE RECONSTRUCTION    . MEDIAL COLLATERAL LIGAMENT AND LATERAL COLLATERAL LIGAMENT REPAIR, KNEE Right 2016    Family History  Problem Relation Age of Onset  . Depression Mother   . Diabetes Mother   . Early death Mother   . Kidney disease Mother   . HIV/AIDS Mother   . Alcohol abuse Father   . Diabetes Father   . Early death Father   . Leukemia Father   . Depression Sister   . Diabetes Sister   . Drug abuse Sister   . Early death Sister   . Cancer Sister   . Early death Paternal Grandfather   . Heart attack Paternal Grandfather   . Heart disease  Paternal Grandfather     No Known Allergies  Current Outpatient Medications on File Prior to Visit  Medication Sig Dispense Refill  . famotidine (PEPCID) 20 MG tablet Take 20 mg by mouth 2 (two) times daily.    . hydrOXYzine (VISTARIL) 25 MG capsule Take 25 mg by mouth every 6 (six) hours as needed for anxiety.    . traZODone (DESYREL) 150 MG tablet Take 75 mg by mouth at bedtime as needed for sleep.     No current facility-administered medications on file prior to visit.     BP 122/84   Pulse 94   Temp 97.9 F (36.6 C) (Oral)   Ht 5' 10.5" (1.791 m)   Wt 162 lb (73.5 kg)   SpO2 96%   BMI 22.92 kg/m    Objective:   Physical Exam  Constitutional: He is oriented to person, place, and time. He appears well-nourished.  HENT:  Mouth/Throat: No oropharyngeal exudate.  Eyes: Pupils are equal, round, and reactive to light. EOM are normal.  Neck: Neck supple. No thyromegaly present.  Cardiovascular: Normal rate and regular rhythm.  Respiratory: Effort normal and breath sounds normal.  GI: Soft. Bowel sounds are normal. There is no tenderness.  Musculoskeletal: Normal range of motion.  Neurological: He is alert and oriented to person, place, and time.  Skin: Skin is warm and dry.  Psychiatric: He has a normal mood and affect.           Assessment & Plan:

## 2018-05-10 NOTE — Assessment & Plan Note (Signed)
Smoking 1 PPD, ready to quit and has been successful on Chantix before.  Rx for starting and continuing packs sent to pharmacy, instructions and potential side effects discussed. He will update.

## 2018-05-10 NOTE — Patient Instructions (Addendum)
Start varenicline (Chantix) tablets for smoking. Start with the starter pack, then fill the continuing pack. Please update me once you complete the continuing pack.  Start atorvastatin 10 mg tablets for cholesterol. Take 1 tablet by mouth once daily.  You will be contacted regarding your referral to GI for the colonoscopy and to the eye doctor.  Please let us know if you have not been contacted within one week.   Start exercising. You should be getting 150 minutes of moderate intensity exercise weekly.  It's important to improve your diet by reducing consumption of fast food, fried food, processed snack foods, sugary drinks. Increase consumption of fresh vegetables and fruits, whole grains, water.  Ensure you are drinking 64 ounces of water daily.  Schedule a follow up visit in 6 months for diabetes check.  It was a pleasure to see you today!   Preventive Care 40-64 Years, Male Preventive care refers to lifestyle choices and visits with your health care provider that can promote health and wellness. What does preventive care include?  A yearly physical exam. This is also called an annual well check.  Dental exams once or twice a year.  Routine eye exams. Ask your health care provider how often you should have your eyes checked.  Personal lifestyle choices, including: ? Daily care of your teeth and gums. ? Regular physical activity. ? Eating a healthy diet. ? Avoiding tobacco and drug use. ? Limiting alcohol use. ? Practicing safe sex. ? Taking low-dose aspirin every day starting at age 25. What happens during an annual well check? The services and screenings done by your health care provider during your annual well check will depend on your age, overall health, lifestyle risk factors, and family history of disease. Counseling Your health care provider may ask you questions about your:  Alcohol use.  Tobacco use.  Drug use.  Emotional well-being.  Home and relationship  well-being.  Sexual activity.  Eating habits.  Work and work Statistician.  Screening You may have the following tests or measurements:  Height, weight, and BMI.  Blood pressure.  Lipid and cholesterol levels. These may be checked every 5 years, or more frequently if you are over 52 years old.  Skin check.  Lung cancer screening. You may have this screening every year starting at age 38 if you have a 30-pack-year history of smoking and currently smoke or have quit within the past 15 years.  Fecal occult blood test (FOBT) of the stool. You may have this test every year starting at age 49.  Flexible sigmoidoscopy or colonoscopy. You may have a sigmoidoscopy every 5 years or a colonoscopy every 10 years starting at age 30.  Prostate cancer screening. Recommendations will vary depending on your family history and other risks.  Hepatitis C blood test.  Hepatitis B blood test.  Sexually transmitted disease (STD) testing.  Diabetes screening. This is done by checking your blood sugar (glucose) after you have not eaten for a while (fasting). You may have this done every 1-3 years.  Discuss your test results, treatment options, and if necessary, the need for more tests with your health care provider. Vaccines Your health care provider may recommend certain vaccines, such as:  Influenza vaccine. This is recommended every year.  Tetanus, diphtheria, and acellular pertussis (Tdap, Td) vaccine. You may need a Td booster every 10 years.  Varicella vaccine. You may need this if you have not been vaccinated.  Zoster vaccine. You may need this after age 68.  Measles, mumps, and rubella (MMR) vaccine. You may need at least one dose of MMR if you were born in 1957 or later. You may also need a second dose.  Pneumococcal 13-valent conjugate (PCV13) vaccine. You may need this if you have certain conditions and have not been vaccinated.  Pneumococcal polysaccharide (PPSV23) vaccine. You  may need one or two doses if you smoke cigarettes or if you have certain conditions.  Meningococcal vaccine. You may need this if you have certain conditions.  Hepatitis A vaccine. You may need this if you have certain conditions or if you travel or work in places where you may be exposed to hepatitis A.  Hepatitis B vaccine. You may need this if you have certain conditions or if you travel or work in places where you may be exposed to hepatitis B.  Haemophilus influenzae type b (Hib) vaccine. You may need this if you have certain risk factors.  Talk to your health care provider about which screenings and vaccines you need and how often you need them. This information is not intended to replace advice given to you by your health care provider. Make sure you discuss any questions you have with your health care provider. Document Released: 06/13/2015 Document Revised: 02/04/2016 Document Reviewed: 03/18/2015 Elsevier Interactive Patient Education  [employer].

## 2018-05-10 NOTE — Assessment & Plan Note (Signed)
Well controlled with recent A1C of 6.8. Continue Metformin BID. Initiated statin therapy today. Pneumonia vaccination UTD. Foot exam next visit. Referral placed for eye exam. Follow up in 6 months.

## 2018-05-10 NOTE — Telephone Encounter (Signed)
Shaun Roberts, I know you're swamped. Can you get this gentleman in with hand surgery before the end of the year? He will lose his insurance on 05/31/18.

## 2018-05-10 NOTE — Assessment & Plan Note (Signed)
Will be meeting with hand surgeon for evaluation. Referral placed.

## 2018-05-11 ENCOUNTER — Telehealth: Payer: Self-pay

## 2018-05-11 NOTE — Telephone Encounter (Signed)
Pt is returning Michele's call. Pls call [name] to schedule colon test.

## 2018-05-12 ENCOUNTER — Other Ambulatory Visit: Payer: Self-pay

## 2018-05-12 DIAGNOSIS — Z1211 Encounter for screening for malignant neoplasm of colon: Secondary | ICD-10-CM

## 2018-05-12 MED ORDER — NA SULFATE-K SULFATE-MG SULF 17.5-3.13-1.6 GM/177ML PO SOLN: 1.0000 | Freq: Once | ORAL | 0 refills | Status: AC

## 2018-05-12 NOTE — Telephone Encounter (Signed)
Colonoscopy has been scheduled for 05/18/18 [place].  Instructions have been sent via email.  Suprep rx has been sent to [facility] on [address].  Thanks [employer]

## 2018-05-17 NOTE — Discharge Instructions (Signed)
General Anesthesia, Adult, Care After  This sheet gives you information about how to care for yourself after your procedure. Your health care provider may also give you more specific instructions. If you have problems or questions, contact your health care provider.  What can I expect after the procedure?  After the procedure, the following side effects are common:  Pain or discomfort at the IV site.  Nausea.  Vomiting.  Sore throat.  Trouble concentrating.  Feeling cold or chills.  Weak or tired.  Sleepiness and fatigue.  Soreness and body aches. These side effects can affect parts of the body that were not involved in surgery.  Follow these instructions at home:    For at least 24 hours after the procedure:  Have a responsible adult stay with you. It is important to have someone help care for you until you are awake and alert.  Rest as needed.  Do not:  Participate in activities in which you could fall or become injured.  Drive.  Use heavy machinery.  Drink alcohol.  Take sleeping pills or medicines that cause drowsiness.  Make important decisions or sign legal documents.  Take care of children on your own.  Eating and drinking  Follow any instructions from your health care provider about eating or drinking restrictions.  When you feel hungry, start by eating small amounts of foods that are soft and easy to digest (bland), such as toast. Gradually return to your regular diet.  Drink enough fluid to keep your urine pale yellow.  If you vomit, rehydrate by drinking water, juice, or clear broth.  General instructions  If you have sleep apnea, surgery and certain medicines can increase your risk for breathing problems. Follow instructions from your health care provider about wearing your sleep device:  Anytime you are sleeping, including during daytime naps.  While taking prescription pain medicines, sleeping medicines, or medicines that make you drowsy.  Return to your normal activities as told by your health care  provider. Ask your health care provider what activities are safe for you.  Take over-the-counter and prescription medicines only as told by your health care provider.  If you smoke, do not smoke without supervision.  Keep all follow-up visits as told by your health care provider. This is important.  Contact a health care provider if:  You have nausea or vomiting that does not get better with medicine.  You cannot eat or drink without vomiting.  You have pain that does not get better with medicine.  You are unable to pass urine.  You develop a skin rash.  You have a fever.  You have redness around your IV site that gets worse.  Get help right away if:  You have difficulty breathing.  You have chest pain.  You have blood in your urine or stool, or you vomit blood.  Summary  After the procedure, it is common to have a sore throat or nausea. It is also common to feel tired.  Have a responsible adult stay with you for the first 24 hours after general anesthesia. It is important to have someone help care for you until you are awake and alert.  When you feel hungry, start by eating small amounts of foods that are soft and easy to digest (bland), such as toast. Gradually return to your regular diet.  Drink enough fluid to keep your urine pale yellow.  Return to your normal activities as told by your health care provider. Ask your health care   provider what activities are safe for you.  This information is not intended to replace advice given to you by your health care provider. Make sure you discuss any questions you have with your health care provider.  Document Released: 08/23/2000 Document Revised: 12/31/2016 Document Reviewed: 12/31/2016  Elsevier Interactive Patient Education  2019 Elsevier Inc[employer]

## 2018-05-18 ENCOUNTER — Ambulatory Visit: Payer: 59 | Admitting: Anesthesiology

## 2018-05-18 ENCOUNTER — Ambulatory Visit
Admission: RE | Admit: 2018-05-18 | Discharge: 2018-05-18 | Disposition: A | Discharge status: Home / Self Care | Payer: 59 | Attending: Gastroenterology | Admitting: Gastroenterology

## 2018-05-18 ENCOUNTER — Encounter
Admission: RE | Disposition: A | Discharge status: Home / Self Care | Payer: Self-pay | Source: Home / Self Care | Attending: Gastroenterology

## 2018-05-18 DIAGNOSIS — Z7984 Long term (current) use of oral hypoglycemic drugs: Secondary | ICD-10-CM | POA: Insufficient documentation

## 2018-05-18 DIAGNOSIS — F329 Major depressive disorder, single episode, unspecified: Secondary | ICD-10-CM | POA: Insufficient documentation

## 2018-05-18 DIAGNOSIS — Z1211 Encounter for screening for malignant neoplasm of colon: Secondary | ICD-10-CM | POA: Insufficient documentation

## 2018-05-18 DIAGNOSIS — D123 Benign neoplasm of transverse colon: Secondary | ICD-10-CM | POA: Diagnosis not present

## 2018-05-18 DIAGNOSIS — J449 Chronic obstructive pulmonary disease, unspecified: Secondary | ICD-10-CM | POA: Diagnosis not present

## 2018-05-18 DIAGNOSIS — M1990 Unspecified osteoarthritis, unspecified site: Secondary | ICD-10-CM | POA: Insufficient documentation

## 2018-05-18 DIAGNOSIS — F17210 Nicotine dependence, cigarettes, uncomplicated: Secondary | ICD-10-CM | POA: Insufficient documentation

## 2018-05-18 DIAGNOSIS — K641 Second degree hemorrhoids: Secondary | ICD-10-CM | POA: Diagnosis not present

## 2018-05-18 DIAGNOSIS — Z79899 Other long term (current) drug therapy: Secondary | ICD-10-CM | POA: Insufficient documentation

## 2018-05-18 DIAGNOSIS — K635 Polyp of colon: Secondary | ICD-10-CM

## 2018-05-18 DIAGNOSIS — D125 Benign neoplasm of sigmoid colon: Secondary | ICD-10-CM

## 2018-05-18 DIAGNOSIS — I10 Essential (primary) hypertension: Secondary | ICD-10-CM | POA: Insufficient documentation

## 2018-05-18 DIAGNOSIS — E785 Hyperlipidemia, unspecified: Secondary | ICD-10-CM | POA: Insufficient documentation

## 2018-05-18 DIAGNOSIS — E119 Type 2 diabetes mellitus without complications: Secondary | ICD-10-CM | POA: Insufficient documentation

## 2018-05-18 HISTORY — PX: POLYPECTOMY: SHX5525

## 2018-05-18 HISTORY — PX: COLONOSCOPY WITH PROPOFOL: SHX5780

## 2018-05-18 LAB — GLUCOSE, CAPILLARY: GLUCOSE-CAPILLARY: 200 mg/dL — AB (ref 70–99)

## 2018-05-18 SURGERY — COLONOSCOPY WITH PROPOFOL
Anesthesia: General | Site: Rectum

## 2018-05-18 MED ORDER — LIDOCAINE HCL (CARDIAC) PF 100 MG/5ML IV SOSY
PREFILLED_SYRINGE | INTRAVENOUS | Status: DC | PRN
  Administered 2018-05-18: 40 mg via INTRAVENOUS

## 2018-05-18 MED ORDER — PROPOFOL 10 MG/ML IV BOLUS
INTRAVENOUS | Status: DC | PRN
  Administered 2018-05-18: 100 mg via INTRAVENOUS
  Administered 2018-05-18: 20 mg via INTRAVENOUS
  Administered 2018-05-18: 50 mg via INTRAVENOUS

## 2018-05-18 MED ORDER — LACTATED RINGERS IV SOLN
10.0000 mL/h | INTRAVENOUS | Status: DC
  Administered 2018-05-18: 10 mL/h via INTRAVENOUS

## 2018-05-18 MED ORDER — ONDANSETRON HCL 4 MG/2ML IJ SOLN: 4.0000 mg | Freq: Once | INTRAMUSCULAR | Status: DC | PRN

## 2018-05-18 MED ORDER — STERILE WATER FOR IRRIGATION IR SOLN
Status: DC | PRN
  Administered 2018-05-18: 11:00:00

## 2018-05-18 SURGICAL SUPPLY — 8 items
CANISTER SUCT 1200ML W/VALVE (MISCELLANEOUS) ×2 IMPLANT
FORCEPS BIOP RAD 4 LRG CAP 4 (CUTTING FORCEPS) ×2 IMPLANT
GOWN CVR UNV OPN BCK APRN NK (MISCELLANEOUS) ×2 IMPLANT
GOWN ISOL THUMB LOOP REG UNIV (MISCELLANEOUS) ×2
KIT ENDO PROCEDURE OLY (KITS) ×2 IMPLANT
SNARE SHORT THROW 13M SML OVAL (MISCELLANEOUS) ×2 IMPLANT
TRAP ETRAP POLY (MISCELLANEOUS) ×2 IMPLANT
WATER STERILE IRR 250ML POUR (IV SOLUTION) ×2 IMPLANT

## 2018-05-18 NOTE — Anesthesia Procedure Notes (Signed)
Procedure Name: MAC Date/Time: 05/18/2018 11:12 AM Performed by: Jeannene Patella, CRNA Pre-anesthesia Checklist: Patient identified, Emergency Drugs available, Suction available and Patient being monitored Patient Re-evaluated:Patient Re-evaluated prior to induction Oxygen Delivery Method: Nasal cannula

## 2018-05-18 NOTE — Anesthesia Postprocedure Evaluation (Signed)
Anesthesia Post Note  Patient: Shaun Roberts  Procedure(s) Performed: COLONOSCOPY WITH BIOPSIES (N/A Rectum) POLYPECTOMY (N/A Rectum)  Patient location during evaluation: PACU Anesthesia Type: General Level of consciousness: awake Pain management: pain level controlled Vital Signs Assessment: post-procedure vital signs reviewed and stable Respiratory status: respiratory function stable Cardiovascular status: stable Postop Assessment: no signs of nausea or vomiting Anesthetic complications: no    Veda Canning

## 2018-05-18 NOTE — Op Note (Signed)
[facility]astroenter[facility] Patient Name: Shaun Roberts Procedure Date: 05/18/2018 10:38 AM MRN: [id number] Account #: [id number] Date of Birth: [birth year] Admit Type: Outpatient Age: 52 Room: [facility] OR ROOM 01 Gender: Male Note Status: Finalized Procedure:            Colonoscopy Indications:          Screening for colorectal malignant neoplasm Providers:            [name] MD, MD Referring MD:         Pleas Koch (Referring MD) Medicines:            Propofol per Anesthesia Complications:        No immediate complications. Procedure:            Pre-Anesthesia Assessment:                       - Prior to the procedure, a History and Physical was                        performed, and patient medications and allergies were                        reviewed. The patient's tolerance of previous                        anesthesia was also reviewed. The risks and benefits of                        the procedure and the sedation options and risks were                        discussed with the patient. All questions were                        answered, and informed consent was obtained. Prior                        Anticoagulants: The patient has taken no previous                        anticoagulant or antiplatelet agents. ASA Grade                        Assessment: II - A patient with mild systemic disease.                        After reviewing the risks and benefits, the patient was                        deemed in satisfactory condition to undergo the                        procedure.                       After obtaining informed consent, the colonoscope was                        passed under direct vision. Throughout the procedure,  the patient's blood pressure, pulse, and oxygen                        saturations were monitored continuously. The was                        introduced through the anus and advanced to the the                 cecum, identified by appendiceal orifice and ileocecal                        valve. The colonoscopy was performed without                        difficulty. The patient tolerated the procedure well.                        The quality of the bowel preparation was excellent. Findings:      The perianal and digital rectal examinations were normal.      A 3 mm polyp was found in the transverse colon. The polyp was sessile.       The polyp was removed with a cold biopsy forceps. Resection and       retrieval were complete.      A 4 mm polyp was found in the sigmoid colon. The polyp was sessile. The       polyp was removed with a cold biopsy forceps. Resection and retrieval       were complete.      Non-bleeding internal hemorrhoids were found during retroflexion. The       hemorrhoids were Grade II (internal hemorrhoids that prolapse but reduce       spontaneously). Impression:           - One 3 mm polyp in the transverse colon, removed with                        a cold biopsy forceps. Resected and retrieved.                       - One 4 mm polyp in the sigmoid colon, removed with a                        cold biopsy forceps. Resected and retrieved.                       - Non-bleeding internal hemorrhoids. Recommendation:       - Discharge patient to home.                       - Resume previous diet.                       - Continue present medications.                       - Await pathology results.                       - Repeat colonoscopy in 5 years if polyp adenoma and 10  years if hyperplastic Procedure Code(s):    --- Professional ---                       [phone], Colonoscopy, flexible; with biopsy, single or                        multiple Diagnosis Code(s):    --- Professional ---                       Z12.11, Encounter for screening for malignant neoplasm                        of colon                       D12.3, Benign neoplasm of  transverse colon (hepatic                        flexure or splenic flexure)                       D12.5, Benign neoplasm of sigmoid colon CPT copyright 2018 American Medical Association. All rights reserved. The codes documented in this report are preliminary and upon coder review may  be revised to meet current compliance requirements. [name] MD, MD 05/18/2018 11:25:56 AM This report has been signed electronically. Number of Addenda: 0 Note Initiated On: 05/18/2018 10:38 AM Scope Withdrawal Time: 0 hours 7 minutes 7 seconds  Total Procedure Duration: 0 hours 13 minutes 44 seconds       [facility]

## 2018-05-18 NOTE — Anesthesia Preprocedure Evaluation (Signed)
Anesthesia Evaluation  Patient identified by MRN, date of birth, ID band Patient awake    Reviewed: Allergy & Precautions, NPO status , Patient's Chart, lab work & pertinent test results  Airway Mallampati: II  TM Distance: >3 FB     Dental   Pulmonary COPD, Current Smoker,    breath sounds clear to auscultation       Cardiovascular hypertension,  Rhythm:Regular Rate:Normal  HLD   Neuro/Psych Depression Alcohol abuse   GI/Hepatic   Endo/Other  diabetes  Renal/GU      Musculoskeletal  (+) Arthritis ,   Abdominal   Peds  Hematology   Anesthesia Other Findings   Reproductive/Obstetrics                             Anesthesia Physical Anesthesia Plan  ASA: III  Anesthesia Plan: General   Post-op Pain Management:    Induction: Intravenous  PONV Risk Score and Plan: Propofol infusion and TIVA  Airway Management Planned: Nasal Cannula and Natural Airway  Additional Equipment:   Intra-op Plan:   Post-operative Plan:   Informed Consent: I have reviewed the patients History and Physical, chart, labs and discussed the procedure including the risks, benefits and alternatives for the proposed anesthesia with the patient or authorized representative who has indicated his/her understanding and acceptance.     Plan Discussed with: CRNA  Anesthesia Plan Comments:         Anesthesia Quick Evaluation

## 2018-05-18 NOTE — Transfer of Care (Signed)
Immediate Anesthesia Transfer of Care Note  Patient: Shaun Roberts  Procedure(s) Performed: COLONOSCOPY WITH BIOPSY (N/A Rectum) POLYPECTOMY (N/A Rectum)  Patient Location: PACU  Anesthesia Type: General  Level of Consciousness: awake, alert  and patient cooperative  Airway and Oxygen Therapy: Patient Spontanous Breathing and Patient connected to supplemental oxygen  Post-op Assessment: Post-op Vital signs reviewed, Patient's Cardiovascular Status Stable, Respiratory Function Stable, Patent Airway and No signs of Nausea or vomiting  Post-op Vital Signs: Reviewed and stable  Complications: No apparent anesthesia complications

## 2018-05-18 NOTE — H&P (Signed)
Shaun Lame, MD [facility] [address] [place], [place] [address] Phone: [phone] Fax : [phone]  Primary Care Physician:  Pleas Koch, NP Primary Gastroenterologist:  Dr. Allen Norris  Pre-Procedure History & Physical: HPI:  Shaun Roberts is a 52 y.o. male is here for a screening colonoscopy.   Past Medical History:  Diagnosis Date  . Alcohol abuse   . Arthritis   . Colon polyps   . COPD (chronic obstructive pulmonary disease) ([place])   . Depression   . Diabetes mellitus without complication ([place])   . Hyperlipidemia   . Hypertension     Past Surgical History:  Procedure Laterality Date  . ANTERIOR CRUCIATE LIGAMENT REPAIR Right 2016  . KNEE RECONSTRUCTION    . MEDIAL COLLATERAL LIGAMENT AND LATERAL COLLATERAL LIGAMENT REPAIR, KNEE Right 2016    Prior to Admission medications   Medication Sig Start Date End Date Taking? Authorizing Provider  albuterol (PROVENTIL HFA;VENTOLIN HFA) 108 (90 Base) MCG/ACT inhaler Inhale 1-2 puffs into the lungs every 6 (six) hours as needed for wheezing or shortness of breath. 05/10/18  Yes Pleas Koch, NP  atorvastatin (LIPITOR) 10 MG tablet Take 1 tablet (10 mg total) by mouth daily. For cholesterol. 05/10/18  Yes Pleas Koch, NP  hydrOXYzine (VISTARIL) 25 MG capsule Take 25 mg by mouth every 6 (six) hours as needed for anxiety.   Yes [provider]  metFORMIN (GLUCOPHAGE) 1000 MG tablet Take 1 tablet (1,000 mg total) by mouth 2 (two) times daily with a meal. For diabetes. 05/10/18  Yes Pleas Koch, NP  traZODone (DESYREL) 150 MG tablet Take 75 mg by mouth at bedtime as needed for sleep.   Yes [provider]  famotidine (PEPCID) 20 MG tablet Take 20 mg by mouth 2 (two) times daily.    [provider]  varenicline (CHANTIX CONTINUING MONTH PAK) 1 MG tablet Take 1 tablet (1 mg total) by mouth 2 (two) times daily. Patient not taking: Reported on 05/18/2018 05/10/18   Pleas Koch,  NP  varenicline (CHANTIX STARTING MONTH PAK) 0.5 MG X 11 & 1 MG X 42 tablet Take 0.5 mg by mouth once daily for 3 days, then increase to 0.5 mg twice daily for 4 days, then increase to 1 mg twice daily. Patient not taking: Reported on 05/18/2018 05/10/18   Pleas Koch, NP    Allergies as of 05/12/2018  . (No Known Allergies)    Family History  Problem Relation Age of Onset  . Depression Mother   . Diabetes Mother   . Early death Mother   . Kidney disease Mother   . HIV/AIDS Mother   . Alcohol abuse Father   . Diabetes Father   . Early death Father   . Leukemia Father   . Depression Sister   . Diabetes Sister   . Drug abuse Sister   . Early death Sister   . Cancer Sister   . Early death Paternal Grandfather   . Heart attack Paternal Grandfather   . Heart disease Paternal Grandfather     Social History   Socioeconomic History  . Marital status: Divorced    Spouse name: Not on file  . Number of children: Not on file  . Years of education: Not on file  . Highest education level: Not on file  Occupational History  . Not on file  Social Needs  . Financial resource strain: Not on file  . Food insecurity:    Worry:  Not on file    Inability: Not on file  . Transportation needs:    Medical: Not on file    Non-medical: Not on file  Tobacco Use  . Smoking status: Current Every Day Smoker    Packs/day: 1.50  . Smokeless tobacco: Never Used  Substance and Sexual Activity  . Alcohol use: Not Currently    Comment: Hx of alcoholism  . Drug use: Not on file  . Sexual activity: Not on file  Lifestyle  . Physical activity:    Days per week: Not on file    Minutes per session: Not on file  . Stress: Not on file  Relationships  . Social connections:    Talks on phone: Not on file    Gets together: Not on file    Attends religious service: Not on file    Active member of club or organization: Not on file    Attends meetings of clubs or organizations: Not on file     Relationship status: Not on file  . Intimate partner violence:    Fear of current or ex partner: Not on file    Emotionally abused: Not on file    Physically abused: Not on file    Forced sexual activity: Not on file  Other Topics Concern  . Not on file  Social History Narrative  . Not on file    Review of Systems: See HPI, otherwise negative ROS  Physical Exam: BP 119/85   Pulse 96   Temp 99 F (37.2 C) (Temporal)   Resp 16   Ht 5\' 11"  (1.803 m)   Wt 72.6 kg   SpO2 99%   BMI 22.32 kg/m  General:   Alert,  pleasant and cooperative in NAD Head:  Normocephalic and atraumatic. Neck:  Supple; no masses or thyromegaly. Lungs:  Clear throughout to auscultation.    Heart:  Regular rate and rhythm. Abdomen:  Soft, nontender and nondistended. Normal bowel sounds, without guarding, and without rebound.   Neurologic:  Alert and  oriented x4;  grossly normal neurologically.  Impression/Plan: Shaun Roberts is now here to undergo a screening colonoscopy.  Risks, benefits, and alternatives regarding colonoscopy have been reviewed with the patient.  Questions have been answered.  All parties agreeable.

## 2018-05-19 ENCOUNTER — Encounter: Payer: Self-pay | Admitting: Gastroenterology

## 2018-05-22 ENCOUNTER — Encounter: Payer: Self-pay | Admitting: Gastroenterology

## 2018-05-26 ENCOUNTER — Encounter: Payer: Self-pay | Admitting: Gastroenterology

## 2018-06-05 ENCOUNTER — Encounter: Payer: Self-pay | Admitting: Primary Care

## 2018-06-05 ENCOUNTER — Ambulatory Visit (INDEPENDENT_AMBULATORY_CARE_PROVIDER_SITE_OTHER): Payer: 59 | Admitting: Primary Care

## 2018-06-05 VITALS — BP 120/78 | HR 88 | Temp 98.1°F | Ht 70.5 in | Wt 169.0 lb

## 2018-06-05 DIAGNOSIS — R109 Unspecified abdominal pain: Secondary | ICD-10-CM | POA: Diagnosis not present

## 2018-06-05 DIAGNOSIS — G4700 Insomnia, unspecified: Secondary | ICD-10-CM | POA: Diagnosis not present

## 2018-06-05 LAB — POC URINALSYSI DIPSTICK (AUTOMATED)
Bilirubin, UA: NEGATIVE
Blood, UA: NEGATIVE
Glucose, UA: POSITIVE — AB
Leukocytes, UA: NEGATIVE
NITRITE UA: NEGATIVE
Protein, UA: NEGATIVE
Spec Grav, UA: 1.025 (ref 1.010–1.025)
UROBILINOGEN UA: 0.2 U/dL
pH, UA: 5.5 (ref 5.0–8.0)

## 2018-06-05 MED ORDER — TRAZODONE HCL 150 MG PO TABS: 150.0000 mg | ORAL_TABLET | Freq: Every evening | ORAL | 1 refills | Status: DC | PRN

## 2018-06-05 NOTE — Progress Notes (Signed)
Subjective:    Patient ID: Shaun Roberts, male    DOB: 12/20/1965, 53 y.o.   MRN: [id number]  HPI  Mr. Nest is a 53 year old male with a history of alcohol abuse, tobacco abuse, COPD, Type 2 diabetes who presents today with a chief complaint of side/abdominal pain. He is also needing a refill of his trazodone.   His pain is located to the right lower side with radiation to right lower back. His pain began 5 days ago, worse three days ago, now nearly resolved. He notices his pain when moving or walking, no tenderness with palpation.   He denies nausea, vomiting, fevers, diarrhea, urinary changes, hematuria, increased physical activity, injury. He's taken Aleve a few times without much improvement. Overall his pain has improved but still notices somewhat.   Review of Systems  Constitutional: Negative for fever.  Respiratory: Negative for shortness of breath.   Cardiovascular: Negative for chest pain.  Gastrointestinal: Negative for abdominal pain, constipation, diarrhea, nausea and vomiting.  Musculoskeletal: Negative for back pain.       Past Medical History:  Diagnosis Date  . Alcohol abuse   . Arthritis   . Colon polyps   . COPD (chronic obstructive pulmonary disease) ([place])   . Depression   . Diabetes mellitus without complication ([place])   . Hyperlipidemia   . Hypertension      Social History   Socioeconomic History  . Marital status: Divorced    Spouse name: Not on file  . Number of children: Not on file  . Years of education: Not on file  . Highest education level: Not on file  Occupational History  . Not on file  Social Needs  . Financial resource strain: Not on file  . Food insecurity:    Worry: Not on file    Inability: Not on file  . Transportation needs:    Medical: Not on file    Non-medical: Not on file  Tobacco Use  . Smoking status: Current Every Day Smoker    Packs/day: 1.50  . Smokeless tobacco: Never Used  Substance and Sexual Activity  .  Alcohol use: Not Currently    Comment: Hx of alcoholism  . Drug use: Not on file  . Sexual activity: Not on file  Lifestyle  . Physical activity:    Days per week: Not on file    Minutes per session: Not on file  . Stress: Not on file  Relationships  . Social connections:    Talks on phone: Not on file    Gets together: Not on file    Attends religious service: Not on file    Active member of club or organization: Not on file    Attends meetings of clubs or organizations: Not on file    Relationship status: Not on file  . Intimate partner violence:    Fear of current or ex partner: Not on file    Emotionally abused: Not on file    Physically abused: Not on file    Forced sexual activity: Not on file  Other Topics Concern  . Not on file  Social History Narrative  . Not on file    Past Surgical History:  Procedure Laterality Date  . ANTERIOR CRUCIATE LIGAMENT REPAIR Right 2016  . COLONOSCOPY WITH PROPOFOL N/A 05/18/2018   Procedure: COLONOSCOPY WITH BIOPSIES;  Surgeon: [name], MD;  Location: [facility];  Service: Endoscopy;  Laterality: N/A;  . KNEE RECONSTRUCTION    .  MEDIAL COLLATERAL LIGAMENT AND LATERAL COLLATERAL LIGAMENT REPAIR, KNEE Right 2016  . POLYPECTOMY N/A 05/18/2018   Procedure: POLYPECTOMY;  Surgeon: [name], MD;  Location: [facility];  Service: Endoscopy;  Laterality: N/A;    Family History  Problem Relation Age of Onset  . Depression Mother   . Diabetes Mother   . Early death Mother   . Kidney disease Mother   . HIV/AIDS Mother   . Alcohol abuse Father   . Diabetes Father   . Early death Father   . Leukemia Father   . Depression Sister   . Diabetes Sister   . Drug abuse Sister   . Early death Sister   . Cancer Sister   . Early death Paternal Grandfather   . Heart attack Paternal Grandfather   . Heart disease Paternal Grandfather     No Known Allergies  Current Outpatient Medications on File Prior to Visit    Medication Sig Dispense Refill  . albuterol (PROVENTIL HFA;VENTOLIN HFA) 108 (90 Base) MCG/ACT inhaler Inhale 1-2 puffs into the lungs every 6 (six) hours as needed for wheezing or shortness of breath. 1 Inhaler 0  . atorvastatin (LIPITOR) 10 MG tablet Take 1 tablet (10 mg total) by mouth daily. For cholesterol. 90 tablet 3  . famotidine (PEPCID) 20 MG tablet Take 20 mg by mouth 2 (two) times daily.    . hydrOXYzine (VISTARIL) 25 MG capsule Take 25 mg by mouth every 6 (six) hours as needed for anxiety.    . metFORMIN (GLUCOPHAGE) 1000 MG tablet Take 1 tablet (1,000 mg total) by mouth 2 (two) times daily with a meal. For diabetes. 180 tablet 3  . varenicline (CHANTIX CONTINUING MONTH PAK) 1 MG tablet Take 1 tablet (1 mg total) by mouth 2 (two) times daily. (Patient not taking: Reported on 06/05/2018) 60 tablet 0  . varenicline (CHANTIX STARTING MONTH PAK) 0.5 MG X 11 & 1 MG X 42 tablet Take 0.5 mg by mouth once daily for 3 days, then increase to 0.5 mg twice daily for 4 days, then increase to 1 mg twice daily. (Patient not taking: Reported on 06/05/2018) 53 tablet 0   No current facility-administered medications on file prior to visit.     BP 120/78   Pulse 88   Temp 98.1 F (36.7 C) (Oral)   Ht 5' 10.5" (1.791 m)   Wt 169 lb (76.7 kg)   SpO2 98%   BMI 23.91 kg/m    Objective:   Physical Exam  Constitutional: He appears well-nourished.  Cardiovascular: Normal rate.  Respiratory: Effort normal.  GI: Soft. Normal appearance and bowel sounds are normal. There is no abdominal tenderness. There is no CVA tenderness.    Right sided trunk pain. Non tender upon palpation.  Skin: Skin is warm and dry.           Assessment & Plan:  Side Pain:  To right lower side with radiation around to right lower back. Abdominal exam unremarkable. UA today negative for blood, leuks, nitrites.  Suspect more MSK involvement given pain with movement and lack of other symptoms. Will have him try  stretching, heat/ice, Tylenol/Aleve PRN. Pain has nearly resolved so we will ask him to follow up if his pain returns.  Pleas Koch, NP

## 2018-06-05 NOTE — Patient Instructions (Signed)
Try stretching, applying heat/ice, taking Aleve as needed for symptoms.  Please notify me if your symptoms return.  It was a pleasure to see you today!

## 2018-06-05 NOTE — Assessment & Plan Note (Signed)
Doing well on Trazodone, taking 150 mg. Refill sent to pharmacy.

## 2018-11-10 ENCOUNTER — Ambulatory Visit: Payer: 59 | Admitting: Primary Care

## 2018-12-15 ENCOUNTER — Ambulatory Visit (INDEPENDENT_AMBULATORY_CARE_PROVIDER_SITE_OTHER): Payer: 59 | Admitting: Primary Care

## 2018-12-15 ENCOUNTER — Encounter: Payer: Self-pay | Admitting: Primary Care

## 2018-12-15 ENCOUNTER — Other Ambulatory Visit: Payer: Self-pay

## 2018-12-15 VITALS — BP 122/84 | HR 105 | Temp 98.2°F | Ht 70.5 in | Wt 172.5 lb

## 2018-12-15 DIAGNOSIS — G4700 Insomnia, unspecified: Secondary | ICD-10-CM

## 2018-12-15 DIAGNOSIS — Z720 Tobacco use: Secondary | ICD-10-CM

## 2018-12-15 DIAGNOSIS — E119 Type 2 diabetes mellitus without complications: Secondary | ICD-10-CM

## 2018-12-15 DIAGNOSIS — M65351 Trigger finger, right little finger: Secondary | ICD-10-CM

## 2018-12-15 LAB — POCT GLYCOSYLATED HEMOGLOBIN (HGB A1C): Hemoglobin A1C: 8.3 % — AB (ref 4.0–5.6)

## 2018-12-15 NOTE — Progress Notes (Signed)
Subjective:    Patient ID: Shaun Roberts, male    DOB: Sep 14, 1965, 53 y.o.   MRN: [id number]  HPI  Shaun Roberts is a 53 year old male with a history of COPD, Type 2 Diabetes, alcohol abuse, tobacco abuse who presents today to discuss disability and for follow up.  1) Type 2 Diabetes:   Current medications include: Metformin 1000 mg BID, he stopped taking this 5 months ago as he forgets to take.   He does not check his blood sugars.   Last A1C: 6.8 in December 2019 Last Eye Exam: Completed in January 2020 Last Foot Exam: Declines Pneumonia Vaccination: Completed in 2019 ACE/ARB: None. Urine microalbumin negative in December 2019 Statin: atorvastatin, not taking as of now.  2) Trigger Finger: Underwent surgical intervention for trigger finger to right 5th digit. Since then he's been unable to make a fist with his hand. He's also with a left 5th digit flexion for which he does not plan on seeking care. Since his surgery in January 2020 he's noticed right shoulder pain with decrease in ROM. He cannot move his shoulder or raise his right upper extremity very high. He plans on applying for disability through social security given these reasons.   BP Readings from Last 3 Encounters:  12/15/18 122/84  06/05/18 120/78  05/18/18 126/80   3) Insomnia: Currently managed on trazodone for which he takes on occasion. No use of hydroxyzine.   Review of Systems  Musculoskeletal: Positive for arthralgias.  Skin: Negative for color change.  Psychiatric/Behavioral: The patient is not nervous/anxious.        Occasional sleep disturbance        Past Medical History:  Diagnosis Date  . Alcohol abuse   . Arthritis   . Colon polyps   . COPD (chronic obstructive pulmonary disease) ([place])   . Depression   . Diabetes mellitus without complication ([place])   . Hyperlipidemia   . Hypertension      Social History   Socioeconomic History  . Marital status: Divorced    Spouse name: Not on file  .  Number of children: Not on file  . Years of education: Not on file  . Highest education level: Not on file  Occupational History  . Not on file  Social Needs  . Financial resource strain: Not on file  . Food insecurity    Worry: Not on file    Inability: Not on file  . Transportation needs    Medical: Not on file    Non-medical: Not on file  Tobacco Use  . Smoking status: Current Every Day Smoker    Packs/day: 1.50  . Smokeless tobacco: Never Used  Substance and Sexual Activity  . Alcohol use: Not Currently    Comment: Hx of alcoholism  . Drug use: Not on file  . Sexual activity: Not on file  Lifestyle  . Physical activity    Days per week: Not on file    Minutes per session: Not on file  . Stress: Not on file  Relationships  . Social Herbalist on phone: Not on file    Gets together: Not on file    Attends religious service: Not on file    Active member of club or organization: Not on file    Attends meetings of clubs or organizations: Not on file    Relationship status: Not on file  . Intimate partner violence    Fear of current or ex partner:  Not on file    Emotionally abused: Not on file    Physically abused: Not on file    Forced sexual activity: Not on file  Other Topics Concern  . Not on file  Social History Narrative  . Not on file    Past Surgical History:  Procedure Laterality Date  . ANTERIOR CRUCIATE LIGAMENT REPAIR Right 2016  . COLONOSCOPY WITH PROPOFOL N/A 05/18/2018   Procedure: COLONOSCOPY WITH BIOPSIES;  Surgeon: [name], MD;  Location: [facility];  Service: Endoscopy;  Laterality: N/A;  . KNEE RECONSTRUCTION    . MEDIAL COLLATERAL LIGAMENT AND LATERAL COLLATERAL LIGAMENT REPAIR, KNEE Right 2016  . POLYPECTOMY N/A 05/18/2018   Procedure: POLYPECTOMY;  Surgeon: [name], MD;  Location: [facility];  Service: Endoscopy;  Laterality: N/A;    Family History  Problem Relation Age of Onset  . Depression Mother    . Diabetes Mother   . Early death Mother   . Kidney disease Mother   . HIV/AIDS Mother   . Alcohol abuse Father   . Diabetes Father   . Early death Father   . Leukemia Father   . Depression Sister   . Diabetes Sister   . Drug abuse Sister   . Early death Sister   . Cancer Sister   . Early death Paternal Grandfather   . Heart attack Paternal Grandfather   . Heart disease Paternal Grandfather     No Known Allergies  Current Outpatient Medications on File Prior to Visit  Medication Sig Dispense Refill  . albuterol (PROVENTIL HFA;VENTOLIN HFA) 108 (90 Base) MCG/ACT inhaler Inhale 1-2 puffs into the lungs every 6 (six) hours as needed for wheezing or shortness of breath. 1 Inhaler 0  . atorvastatin (LIPITOR) 10 MG tablet Take 1 tablet (10 mg total) by mouth daily. For cholesterol. 90 tablet 3  . famotidine (PEPCID) 20 MG tablet Take 20 mg by mouth 2 (two) times daily.    . metFORMIN (GLUCOPHAGE) 1000 MG tablet Take 1 tablet (1,000 mg total) by mouth 2 (two) times daily with a meal. For diabetes. 180 tablet 3  . traZODone (DESYREL) 150 MG tablet Take 1 tablet (150 mg total) by mouth at bedtime as needed for sleep. 90 tablet 1   No current facility-administered medications on file prior to visit.     BP 122/84   Pulse (!) 105   Temp 98.2 F (36.8 C) (Temporal)   Ht 5' 10.5" (1.791 m)   Wt 172 lb 8 oz (78.2 kg)   SpO2 98%   BMI 24.40 kg/m    Objective:   Physical Exam  Constitutional: He appears well-nourished.  Neck: Neck supple.  Cardiovascular: Normal rate and regular rhythm.  Respiratory: Effort normal and breath sounds normal.  Musculoskeletal:     Right shoulder: He exhibits decreased range of motion and pain. He exhibits no tenderness.     Comments: 5/5 strength to right upper extremity. Decrease in ROM when raising right upper extremity forward, laterally, and posteriorly. Unable to raise to 90 degree angle.   Skin: Skin is warm and dry.  Psychiatric: He has a  normal mood and affect.           Assessment & Plan:

## 2018-12-15 NOTE — Assessment & Plan Note (Signed)
Underwent surgical intervention in early 2020. Does not plan on addressing left 5th digit.

## 2018-12-15 NOTE — Patient Instructions (Signed)
Stop by the lab prior to leaving today. I will notify you of your results once received.   Resume your Metformin and atorvastatin medications. This is important for prevention of diabetes complications, heart disease/stroke.  Please schedule a physical with me for 6 months. You may also schedule a lab only appointment 3-4 days prior. We will discuss your lab results in detail during your physical.  It was a pleasure to see you today!

## 2018-12-15 NOTE — Assessment & Plan Note (Signed)
Non compliant to metformin for the last 5 months. Repeat A1C pending today. Discussed importance of diabetes medication compliance including risks for diabetes complications.  Eye exam UTD. Declines foot exam. Pneumonia vaccination UTD. Discussed to resume statin therapy. Urine microalbumin negative in 04/2018.  Follow up in 6 months.

## 2018-12-15 NOTE — Assessment & Plan Note (Signed)
Using Trazodone intermittently, continue same.

## 2018-12-15 NOTE — Assessment & Plan Note (Signed)
Continues to smoke, could not afford Chantix any longer due to loss of insurance. Advised he quit.

## 2019-06-06 ENCOUNTER — Other Ambulatory Visit: Payer: Self-pay | Admitting: Primary Care

## 2019-06-06 DIAGNOSIS — E119 Type 2 diabetes mellitus without complications: Secondary | ICD-10-CM

## 2019-06-06 DIAGNOSIS — F1010 Alcohol abuse, uncomplicated: Secondary | ICD-10-CM

## 2019-06-11 ENCOUNTER — Telehealth: Payer: Self-pay | Admitting: Primary Care

## 2019-06-11 NOTE — Telephone Encounter (Signed)
Patient's Ex Wife Shaun Roberts (on [place]) called today in regards to the patient  She stated that he has decided it is best to see a doctor, she said that he has not been wanting to eat. He will take one bite of food and throw it away because he has no appetite. Shaun Roberts said he has chronic diarrhea and vomiting  Is it okay to bring the patient for an appointment since this is Chronic

## 2019-06-11 NOTE — Telephone Encounter (Signed)
Of course, I'm happy to see him anytime. He is overdue for diabetes follow up.  Have him schedule.

## 2019-06-12 ENCOUNTER — Telehealth: Payer: Self-pay

## 2019-06-12 NOTE — Telephone Encounter (Signed)
LVM w COVID screen and back lab info 1.12.2021 TLJ

## 2019-06-12 NOTE — Telephone Encounter (Signed)
Attempted to reach patient. He is scheduled for an appointment on 1/18 for a physical but this needs to be rescheduled. Lvm asking him to call office.

## 2019-06-14 ENCOUNTER — Other Ambulatory Visit (INDEPENDENT_AMBULATORY_CARE_PROVIDER_SITE_OTHER): Payer: Self-pay

## 2019-06-14 ENCOUNTER — Other Ambulatory Visit: Payer: Self-pay

## 2019-06-14 DIAGNOSIS — E119 Type 2 diabetes mellitus without complications: Secondary | ICD-10-CM

## 2019-06-14 LAB — LIPID PANEL
Cholesterol: 175 mg/dL (ref 0–200)
HDL: 64.8 mg/dL (ref >39.00)
LDL Cholesterol: 87 mg/dL (ref 0–99)
NonHDL: 109.9
Total CHOL/HDL Ratio: 3
Triglycerides: 115 mg/dL (ref 0.0–149.0)
VLDL: 23 mg/dL (ref 0.0–40.0)

## 2019-06-14 LAB — COMPREHENSIVE METABOLIC PANEL
ALT: 20 U/L (ref 0–53)
AST: 31 U/L (ref 0–37)
Albumin: 3.8 g/dL (ref 3.5–5.2)
Alkaline Phosphatase: 77 U/L (ref 39–117)
BUN: 9 mg/dL (ref 6–23)
CO2: 22 mEq/L (ref 19–32)
Calcium: 9.3 mg/dL (ref 8.4–10.5)
Chloride: 94 mEq/L — ABNORMAL LOW (ref 96–112)
Creatinine, Ser: 0.75 mg/dL (ref 0.40–1.50)
GFR: 108.21 mL/min (ref >60.00)
Glucose, Bld: 93 mg/dL (ref 70–99)
Potassium: 3.2 mEq/L — ABNORMAL LOW (ref 3.5–5.1)
Sodium: 133 mEq/L — ABNORMAL LOW (ref 135–145)
Total Bilirubin: 1.1 mg/dL (ref 0.2–1.2)
Total Protein: 7.6 g/dL (ref 6.0–8.3)

## 2019-06-14 LAB — CBC
HCT: 43.8 % (ref 39.0–52.0)
Hemoglobin: 15.1 g/dL (ref 13.0–17.0)
MCHC: 34.5 g/dL (ref 30.0–36.0)
MCV: 99 fl (ref 78.0–100.0)
Platelets: 239 10*3/uL (ref 150.0–400.0)
RBC: 4.43 Mil/uL (ref 4.22–5.81)
RDW: 14.5 % (ref 11.5–15.5)
WBC: 7.5 10*3/uL (ref 4.0–10.5)

## 2019-06-14 LAB — HEMOGLOBIN A1C: Hgb A1c MFr Bld: 5.3 % (ref 4.6–6.5)

## 2019-06-18 ENCOUNTER — Encounter: Payer: Self-pay | Admitting: Primary Care

## 2019-06-28 ENCOUNTER — Encounter: Payer: Self-pay | Admitting: Primary Care

## 2019-07-02 ENCOUNTER — Encounter: Payer: Self-pay | Admitting: *Deleted

## 2019-07-02 ENCOUNTER — Ambulatory Visit: Payer: Self-pay | Admitting: Primary Care

## 2019-07-02 ENCOUNTER — Other Ambulatory Visit: Payer: Self-pay

## 2019-07-02 ENCOUNTER — Encounter: Payer: Self-pay | Admitting: Primary Care

## 2019-07-02 VITALS — BP 130/84 | HR 100 | Temp 97.4°F | Ht 70.5 in | Wt 158.2 lb

## 2019-07-02 DIAGNOSIS — E119 Type 2 diabetes mellitus without complications: Secondary | ICD-10-CM

## 2019-07-02 DIAGNOSIS — K635 Polyp of colon: Secondary | ICD-10-CM

## 2019-07-02 DIAGNOSIS — Z0000 Encounter for general adult medical examination without abnormal findings: Secondary | ICD-10-CM

## 2019-07-02 DIAGNOSIS — J449 Chronic obstructive pulmonary disease, unspecified: Secondary | ICD-10-CM

## 2019-07-02 DIAGNOSIS — F1010 Alcohol abuse, uncomplicated: Secondary | ICD-10-CM

## 2019-07-02 DIAGNOSIS — G4700 Insomnia, unspecified: Secondary | ICD-10-CM

## 2019-07-02 DIAGNOSIS — D123 Benign neoplasm of transverse colon: Secondary | ICD-10-CM

## 2019-07-02 DIAGNOSIS — Z23 Encounter for immunization: Secondary | ICD-10-CM

## 2019-07-02 MED ORDER — TRAZODONE HCL 150 MG PO TABS: 150.0000 mg | ORAL_TABLET | Freq: Every evening | ORAL | 1 refills | Status: DC | PRN

## 2019-07-02 NOTE — Assessment & Plan Note (Signed)
Colonoscopy UTD, due again in 2024.

## 2019-07-02 NOTE — Assessment & Plan Note (Signed)
Has been off of metformin for 6+ months, recent A1C of 5.3. continue off of metformin for now.  We will have him back in 6 months for monitoring. He will update if anything changes.   He also stopped statin therapy.

## 2019-07-02 NOTE — Assessment & Plan Note (Signed)
No recent use of albuterol inhaler, continue to monitor.  Encouraged smoking cessation.

## 2019-07-02 NOTE — Assessment & Plan Note (Signed)
Colonoscopy in 2019, due for repeat in 2024.

## 2019-07-02 NOTE — Progress Notes (Signed)
Subjective:    Patient ID: Shaun Roberts, male    DOB: 1965-07-14, 54 y.o.   MRN: [id number]  HPI  This visit occurred during the SARS-CoV-2 public health emergency.  Safety protocols were in place, including screening questions prior to the visit, additional usage of staff PPE, and extensive cleaning of exam room while observing appropriate contact time as indicated for disinfecting solutions.   Shaun Roberts is a 54 year old male who presents today for complete physical.  Immunizations: -Tetanus: Completed in 2015 -Influenza: Due  -Shingles: Declines due to cost -Pneumonia: Completed in 2019  Diet: He endorses a poor diet. He eats microwaved, fast food often. He is drinking Gatorade and coffee, no water. Exercise: He is not exercising  Eye exam: No recent exam  Dental exam: No recent exam   Colonoscopy: Completed in 2019, due in 2024 PSA: 0.64 in 2019  BP Readings from Last 3 Encounters:  07/02/19 130/84  12/15/18 122/84  06/05/18 120/78      Review of Systems  Constitutional: Negative for unexpected weight change.  HENT: Negative for rhinorrhea.   Respiratory: Negative for cough and shortness of breath.   Cardiovascular: Negative for chest pain.  Gastrointestinal: Negative for constipation and diarrhea.  Genitourinary: Negative for difficulty urinating.  Musculoskeletal: Positive for arthralgias.  Skin: Negative for rash.  Allergic/Immunologic: Negative for environmental allergies.  Neurological: Negative for dizziness, numbness and headaches.  Psychiatric/Behavioral: The patient is not nervous/anxious.        Doing well on Trazodone       Past Medical History:  Diagnosis Date  . Alcohol abuse   . Arthritis   . Colon polyps   . COPD (chronic obstructive pulmonary disease) ([place])   . Depression   . Diabetes mellitus without complication ([place])   . Hyperlipidemia   . Hypertension      Social History   Socioeconomic History  . Marital status: Divorced   Spouse name: Not on file  . Number of children: Not on file  . Years of education: Not on file  . Highest education level: Not on file  Occupational History  . Not on file  Tobacco Use  . Smoking status: Current Every Day Smoker    Packs/day: 1.50  . Smokeless tobacco: Never Used  Substance and Sexual Activity  . Alcohol use: Not Currently    Comment: Hx of alcoholism  . Drug use: Not on file  . Sexual activity: Not on file  Other Topics Concern  . Not on file  Social History Narrative  . Not on file   Social Determinants of Health   Financial Resource Strain:   . Difficulty of Paying Living Expenses: Not on file  Food Insecurity:   . Worried About Charity fundraiser in the Last Year: Not on file  . Ran Out of Food in the Last Year: Not on file  Transportation Needs:   . Lack of Transportation (Medical): Not on file  . Lack of Transportation (Non-Medical): Not on file  Physical Activity:   . Days of Exercise per Week: Not on file  . Minutes of Exercise per Session: Not on file  Stress:   . Feeling of Stress : Not on file  Social Connections:   . Frequency of Communication with Friends and Family: Not on file  . Frequency of Social Gatherings with Friends and Family: Not on file  . Attends Religious Services: Not on file  . Active Member of Clubs or Organizations: Not on  file  . Attends Archivist Meetings: Not on file  . Marital Status: Not on file  Intimate Partner Violence:   . Fear of Current or Ex-Partner: Not on file  . Emotionally Abused: Not on file  . Physically Abused: Not on file  . Sexually Abused: Not on file    Past Surgical History:  Procedure Laterality Date  . ANTERIOR CRUCIATE LIGAMENT REPAIR Right 2016  . COLONOSCOPY WITH PROPOFOL N/A 05/18/2018   Procedure: COLONOSCOPY WITH BIOPSIES;  Surgeon: [name], MD;  Location: [facility];  Service: Endoscopy;  Laterality: N/A;  . KNEE RECONSTRUCTION    . MEDIAL COLLATERAL  LIGAMENT AND LATERAL COLLATERAL LIGAMENT REPAIR, KNEE Right 2016  . POLYPECTOMY N/A 05/18/2018   Procedure: POLYPECTOMY;  Surgeon: [name], MD;  Location: [facility];  Service: Endoscopy;  Laterality: N/A;    Family History  Problem Relation Age of Onset  . Depression Mother   . Diabetes Mother   . Early death Mother   . Kidney disease Mother   . HIV/AIDS Mother   . Alcohol abuse Father   . Diabetes Father   . Early death Father   . Leukemia Father   . Depression Sister   . Diabetes Sister   . Drug abuse Sister   . Early death Sister   . Cancer Sister   . Early death Paternal Grandfather   . Heart attack Paternal Grandfather   . Heart disease Paternal Grandfather     No Known Allergies  Current Outpatient Medications on File Prior to Visit  Medication Sig Dispense Refill  . albuterol (PROVENTIL HFA;VENTOLIN HFA) 108 (90 Base) MCG/ACT inhaler Inhale 1-2 puffs into the lungs every 6 (six) hours as needed for wheezing or shortness of breath. 1 Inhaler 0  . famotidine (PEPCID) 20 MG tablet Take 20 mg by mouth 2 (two) times daily.     No current facility-administered medications on file prior to visit.    BP 130/84   Pulse 100   Temp (!) 97.4 F (36.3 C) (Temporal)   Ht 5' 10.5" (1.791 m)   Wt 158 lb 4 oz (71.8 kg)   SpO2 98%   BMI 22.39 kg/m    Objective:   Physical Exam  Constitutional: He is oriented to person, place, and time. He appears well-nourished.  HENT:  Right Ear: Tympanic membrane and ear canal normal.  Left Ear: Tympanic membrane and ear canal normal.  Mouth/Throat: Oropharynx is clear and moist.  Eyes: Pupils are equal, round, and reactive to light. EOM are normal.  Cardiovascular: Normal rate and regular rhythm.  Respiratory: Effort normal and breath sounds normal.  GI: Soft. Bowel sounds are normal. There is no abdominal tenderness.  Musculoskeletal:        General: Normal range of motion.     Cervical back: Neck supple.    Neurological: He is alert and oriented to person, place, and time. No cranial nerve deficit.  Reflex Scores:      Patellar reflexes are 2+ on the right side and 2+ on the left side. Skin: Skin is warm and dry.  Psychiatric: He has a normal mood and affect.           Assessment & Plan:

## 2019-07-02 NOTE — Assessment & Plan Note (Signed)
Doing well on Trazodone HS, continue same. Refill sent to pharmacy.

## 2019-07-02 NOTE — Patient Instructions (Signed)
Continue to refrain from drinking alcohol.  It's important to improve your diet by reducing consumption of fast food, fried food, processed snack foods, sugary drinks. Increase consumption of fresh vegetables and fruits, whole grains, water.  Ensure you are drinking 64 ounces of water daily.  Start regular exercise.   Please schedule a follow up appointment in 6 months for diabetes check.   It was a pleasure to see you today!   Preventive Care 76-55 Years Old, Male Preventive care refers to lifestyle choices and visits with your health care provider that can promote health and wellness. This includes:  A yearly physical exam. This is also called an annual well check.  Regular dental and eye exams.  Immunizations.  Screening for certain conditions.  Healthy lifestyle choices, such as eating a healthy diet, getting regular exercise, not using drugs or products that contain nicotine and tobacco, and limiting alcohol use. What can I expect for my preventive care visit? Physical exam Your health care provider will check:  Height and weight. These may be used to calculate body mass index (BMI), which is a measurement that tells if you are at a healthy weight.  Heart rate and blood pressure.  Your skin for abnormal spots. Counseling Your health care provider may ask you questions about:  Alcohol, tobacco, and drug use.  Emotional well-being.  Home and relationship well-being.  Sexual activity.  Eating habits.  Work and work Statistician. What immunizations do I need?  Influenza (flu) vaccine  This is recommended every year. Tetanus, diphtheria, and pertussis (Tdap) vaccine  You may need a Td booster every 10 years. Varicella (chickenpox) vaccine  You may need this vaccine if you have not already been vaccinated. Zoster (shingles) vaccine  You may need this after age 31. Measles, mumps, and rubella (MMR) vaccine  You may need at least one dose of MMR if you were  born in 1957 or later. You may also need a second dose. Pneumococcal conjugate (PCV13) vaccine  You may need this if you have certain conditions and were not previously vaccinated. Pneumococcal polysaccharide (PPSV23) vaccine  You may need one or two doses if you smoke cigarettes or if you have certain conditions. Meningococcal conjugate (MenACWY) vaccine  You may need this if you have certain conditions. Hepatitis A vaccine  You may need this if you have certain conditions or if you travel or work in places where you may be exposed to hepatitis A. Hepatitis B vaccine  You may need this if you have certain conditions or if you travel or work in places where you may be exposed to hepatitis B. Haemophilus influenzae type b (Hib) vaccine  You may need this if you have certain risk factors. Human papillomavirus (HPV) vaccine  If recommended by your health care provider, you may need three doses over 6 months. You may receive vaccines as individual doses or as more than one vaccine together in one shot (combination vaccines). Talk with your health care provider about the risks and benefits of combination vaccines. What tests do I need? Blood tests  Lipid and cholesterol levels. These may be checked every 5 years, or more frequently if you are over 66 years old.  Hepatitis C test.  Hepatitis B test. Screening  Lung cancer screening. You may have this screening every year starting at age 15 if you have a 30-pack-year history of smoking and currently smoke or have quit within the past 15 years.  Prostate cancer screening. Recommendations will vary depending  on your family history and other risks.  Colorectal cancer screening. All adults should have this screening starting at age 40 and continuing until age 67. Your health care provider may recommend screening at age 15 if you are at increased risk. You will have tests every 1-10 years, depending on your results and the type of screening  test.  Diabetes screening. This is done by checking your blood sugar (glucose) after you have not eaten for a while (fasting). You may have this done every 1-3 years.  Sexually transmitted disease (STD) testing. Follow these instructions at home: Eating and drinking  Eat a diet that includes fresh fruits and vegetables, whole grains, lean protein, and low-fat dairy products.  Take vitamin and mineral supplements as recommended by your health care provider.  Do not drink alcohol if your health care provider tells you not to drink.  If you drink alcohol: ? Limit how much you have to 0-2 drinks a day. ? Be aware of how much alcohol is in your drink. In the [place] one drink equals one 12 oz bottle of beer (355 mL), one 5 oz glass of wine (148 mL), or one 1 oz glass of hard liquor (44 mL). Lifestyle  Take daily care of your teeth and gums.  Stay active. Exercise for at least 30 minutes on 5 or more days each week.  Do not use any products that contain nicotine or tobacco, such as cigarettes, e-cigarettes, and chewing tobacco. If you need help quitting, ask your health care provider.  If you are sexually active, practice safe sex. Use a condom or other form of protection to prevent STIs (sexually transmitted infections).  Talk with your health care provider about taking a low-dose aspirin every day starting at age 99. What's next?  Go to your health care provider once a year for a well check visit.  Ask your health care provider how often you should have your eyes and teeth checked.  Stay up to date on all vaccines. This information is not intended to replace advice given to you by your health care provider. Make sure you discuss any questions you have with your health care provider. Document Revised: 05/11/2018 Document Reviewed: 05/11/2018 Elsevier Patient Education  2020 [employer].

## 2019-07-02 NOTE — Assessment & Plan Note (Signed)
Tetanus and Pneumonia vaccinations UTD, flu shot provided today. Declines Shingrix due to cost. PSA UTD. Colonoscopy UTD. Encouraged a healthy diet, regular exercise. Exam today stable. Labs reviewed.

## 2019-07-02 NOTE — Assessment & Plan Note (Signed)
Quit again three weeks ago, commended him on this accomplishment. Recent LFT's unremarkable. Continue to monitor.

## 2019-07-02 NOTE — Addendum Note (Signed)
Addended by: Jacqualin Combes on: 07/02/2019 10:49 AM   Modules accepted: Orders

## 2019-12-31 ENCOUNTER — Encounter: Payer: Self-pay | Admitting: Primary Care

## 2019-12-31 ENCOUNTER — Other Ambulatory Visit: Payer: Self-pay

## 2019-12-31 ENCOUNTER — Ambulatory Visit (INDEPENDENT_AMBULATORY_CARE_PROVIDER_SITE_OTHER): Payer: Medicare Other | Admitting: Primary Care

## 2019-12-31 VITALS — BP 126/84 | HR 79 | Temp 95.8°F | Ht 70.5 in | Wt 169.5 lb

## 2019-12-31 DIAGNOSIS — F1010 Alcohol abuse, uncomplicated: Secondary | ICD-10-CM | POA: Diagnosis not present

## 2019-12-31 DIAGNOSIS — G4700 Insomnia, unspecified: Secondary | ICD-10-CM | POA: Diagnosis not present

## 2019-12-31 DIAGNOSIS — F331 Major depressive disorder, recurrent, moderate: Secondary | ICD-10-CM | POA: Insufficient documentation

## 2019-12-31 DIAGNOSIS — E119 Type 2 diabetes mellitus without complications: Secondary | ICD-10-CM | POA: Diagnosis not present

## 2019-12-31 LAB — POCT GLYCOSYLATED HEMOGLOBIN (HGB A1C): Hemoglobin A1C: 8.2 % — AB (ref 4.0–5.6)

## 2019-12-31 MED ORDER — SERTRALINE HCL 50 MG PO TABS: 50.0000 mg | ORAL_TABLET | Freq: Every day | ORAL | 1 refills | Status: DC

## 2019-12-31 MED ORDER — METFORMIN HCL ER 500 MG PO TB24: 500.0000 mg | ORAL_TABLET | Freq: Every day | ORAL | 3 refills | Status: DC

## 2019-12-31 MED ORDER — TRAZODONE HCL 150 MG PO TABS: 150.0000 mg | ORAL_TABLET | Freq: Every evening | ORAL | 1 refills | Status: DC | PRN

## 2019-12-31 NOTE — Assessment & Plan Note (Signed)
Chronic for years, never any prior treatment. Depression triggers his desire to abuse alcohol.  PHQ 9 score of 20 today. Discussed options for treatment, we will start Zoloft 50 mg. We also discussed the absolute need to refrain from alcohol abuse while on anti-depressant treatment. Also monitor for serotonin syndrome while on Zoloft Rx with Trazodone.   Patient is to take 1/2 tablet daily for 8 days, then advance to 1 full tablet thereafter. We discussed possible side effects of headache, GI upset, drowsiness, and SI/HI. If thoughts of SI/HI develop, we discussed to present to the emergency immediately. Patient verbalized understanding.   Follow up in 6 weeks for re-evaluation.

## 2019-12-31 NOTE — Assessment & Plan Note (Signed)
A1C of 8.2 today which is a significant increase from last visit.   Rx for Metformin XR 500 mg sent to pharmacy. Declines statin therapy for now, will discuss this again at next diabetes follow up.  Declines foot exam today. He will schedule his eye exam. He cannot provide a urine specimen for urine microalbumin, will get at next visit.  Repeat A1C in 3 months. Follow up in January 2022.

## 2019-12-31 NOTE — Assessment & Plan Note (Signed)
Doing well on Trazodone 150 mg, refills sent to pharmacy.

## 2019-12-31 NOTE — Progress Notes (Signed)
Subjective:    Patient ID: Shaun Roberts, male    DOB: 1965-08-19, 54 y.o.   MRN: [id number]  HPI  This visit occurred during the SARS-CoV-2 public health emergency.  Safety protocols were in place, including screening questions prior to the visit, additional usage of staff PPE, and extensive cleaning of exam room while observing appropriate contact time as indicated for disinfecting solutions.   Shaun Roberts is a 54 year old male with a history of COPD, type 2 diabetes, alcohol and tobacco abuse who presents today for follow up of type 2 diabetes. He is also requesting a refill of Trazodone and to discuss depression.  1) Type 2 Diabetes:   Current medications include: None  He is checking his blood glucose 0 times daily.  Last A1C: 5.3 in January 2021, 8.2 today Last Eye Exam: No recent exam Last Foot Exam: Due Pneumonia Vaccination: Completed in 2019 ACE/ARB: None. Urine micro due Statin: None. LDL of 87  BP Readings from Last 3 Encounters:  12/31/19 126/84  07/02/19 130/84  12/15/18 122/84   2) Depression: Chronic symptoms of feeling down/sad, little motivation to do things. Chronic history of alcohol abuse, feelings of depression lead him to want to drink alcohol. He's not had alcohol in five days, was drinking 6 beers daily prior to this.    Review of Systems  Eyes: Negative for visual disturbance.  Respiratory: Negative for shortness of breath.   Cardiovascular: Negative for chest pain.  Neurological: Negative for dizziness and numbness.       Past Medical History:  Diagnosis Date  . Alcohol abuse   . Alcohol withdrawal ([place]) 04/13/2018  . Arthritis   . Colon polyps   . COPD (chronic obstructive pulmonary disease) ([place])   . Depression   . Diabetes mellitus without complication ([place])   . Hyperlipidemia   . Hypertension      Social History   Socioeconomic History  . Marital status: Divorced    Spouse name: Not on file  . Number of children: Not on file  .  Years of education: Not on file  . Highest education level: Not on file  Occupational History  . Not on file  Tobacco Use  . Smoking status: Current Every Day Smoker    Packs/day: 1.50  . Smokeless tobacco: Never Used  Substance and Sexual Activity  . Alcohol use: Not Currently    Comment: Hx of alcoholism  . Drug use: Not on file  . Sexual activity: Not on file  Other Topics Concern  . Not on file  Social History Narrative  . Not on file   Social Determinants of Health   Financial Resource Strain:   . Difficulty of Paying Living Expenses:   Food Insecurity:   . Worried About Charity fundraiser in the Last Year:   . Arboriculturist in the Last Year:   Transportation Needs:   . Film/video editor (Medical):   Marland Kitchen Lack of Transportation (Non-Medical):   Physical Activity:   . Days of Exercise per Week:   . Minutes of Exercise per Session:   Stress:   . Feeling of Stress :   Social Connections:   . Frequency of Communication with Friends and Family:   . Frequency of Social Gatherings with Friends and Family:   . Attends Religious Services:   . Active Member of Clubs or Organizations:   . Attends Archivist Meetings:   Marland Kitchen Marital Status:   Intimate Production manager  Violence:   . Fear of Current or Ex-Partner:   . Emotionally Abused:   Marland Kitchen Physically Abused:   . Sexually Abused:     Past Surgical History:  Procedure Laterality Date  . ANTERIOR CRUCIATE LIGAMENT REPAIR Right 2016  . COLONOSCOPY WITH PROPOFOL N/A 05/18/2018   Procedure: COLONOSCOPY WITH BIOPSIES;  Surgeon: [name], MD;  Location: [facility];  Service: Endoscopy;  Laterality: N/A;  . KNEE RECONSTRUCTION    . MEDIAL COLLATERAL LIGAMENT AND LATERAL COLLATERAL LIGAMENT REPAIR, KNEE Right 2016  . POLYPECTOMY N/A 05/18/2018   Procedure: POLYPECTOMY;  Surgeon: [name], MD;  Location: [facility];  Service: Endoscopy;  Laterality: N/A;    Family History  Problem Relation Age of  Onset  . Depression Mother   . Diabetes Mother   . Early death Mother   . Kidney disease Mother   . HIV/AIDS Mother   . Alcohol abuse Father   . Diabetes Father   . Early death Father   . Leukemia Father   . Depression Sister   . Diabetes Sister   . Drug abuse Sister   . Early death Sister   . Cancer Sister   . Early death Paternal Grandfather   . Heart attack Paternal Grandfather   . Heart disease Paternal Grandfather     No Known Allergies  Current Outpatient Medications on File Prior to Visit  Medication Sig Dispense Refill  . albuterol (PROVENTIL HFA;VENTOLIN HFA) 108 (90 Base) MCG/ACT inhaler Inhale 1-2 puffs into the lungs every 6 (six) hours as needed for wheezing or shortness of breath. 1 Inhaler 0  . famotidine (PEPCID) 20 MG tablet Take 20 mg by mouth 2 (two) times daily.    . traZODone (DESYREL) 150 MG tablet Take 1 tablet (150 mg total) by mouth at bedtime as needed for sleep. 90 tablet 1   No current facility-administered medications on file prior to visit.    BP 126/84   Pulse 79   Temp (!) 95.8 F (35.4 C) (Temporal)   Ht 5' 10.5" (1.791 m)   Wt 169 lb 8 oz (76.9 kg)   SpO2 99%   BMI 23.98 kg/m    Objective:   Physical Exam Cardiovascular:     Rate and Rhythm: Normal rate and regular rhythm.  Pulmonary:     Effort: Pulmonary effort is normal.     Breath sounds: Normal breath sounds.  Musculoskeletal:     Cervical back: Neck supple.  Skin:    General: Skin is warm and dry.            Assessment & Plan:

## 2019-12-31 NOTE — Patient Instructions (Addendum)
Start sertraline (Zoloft) 50 mg daily for depression. Start by taking 1/2 tablet daily for 8 days, then increase to 1 full tablet thereafter.  Start Metformin XR 500 mg once daily with breakfast for diabetes.  Start exercising. You should be getting 150 minutes of moderate intensity exercise weekly.  It is important that you improve your diet. Please limit carbohydrates in the form of white bread, rice, pasta, sweets, fast food, fried food, sugary drinks, etc. Increase your consumption of fresh fruits and vegetables, whole grains, lean protein.  Ensure you are consuming 64 ounces of water daily.  Please schedule a follow up visit for 6 weeks for follow up of anxiety/depression.  It was a pleasure to see you today!   Diabetes Mellitus and Nutrition, Adult When you have diabetes (diabetes mellitus), it is very important to have healthy eating habits because your blood sugar (glucose) levels are greatly affected by what you eat and drink. Eating healthy foods in the appropriate amounts, at about the same times every day, can help you:  Control your blood glucose.  Lower your risk of heart disease.  Improve your blood pressure.  Reach or maintain a healthy weight. Every person with diabetes is different, and each person has different needs for a meal plan. Your health care provider may recommend that you work with a diet and nutrition specialist (dietitian) to make a meal plan that is best for you. Your meal plan may vary depending on factors such as:  The calories you need.  The medicines you take.  Your weight.  Your blood glucose, blood pressure, and cholesterol levels.  Your activity level.  Other health conditions you have, such as heart or kidney disease. How do carbohydrates affect me? Carbohydrates, also called carbs, affect your blood glucose level more than any other type of food. Eating carbs naturally raises the amount of glucose in your blood. Carb counting is a method  for keeping track of how many carbs you eat. Counting carbs is important to keep your blood glucose at a healthy level, especially if you use insulin or take certain oral diabetes medicines. It is important to know how many carbs you can safely have in each meal. This is different for every person. Your dietitian can help you calculate how many carbs you should have at each meal and for each snack. Foods that contain carbs include:  Bread, cereal, rice, pasta, and crackers.  Potatoes and corn.  Peas, beans, and lentils.  Milk and yogurt.  Fruit and juice.  Desserts, such as cakes, cookies, ice cream, and candy. How does alcohol affect me? Alcohol can cause a sudden decrease in blood glucose (hypoglycemia), especially if you use insulin or take certain oral diabetes medicines. Hypoglycemia can be a life-threatening condition. Symptoms of hypoglycemia (sleepiness, dizziness, and confusion) are similar to symptoms of having too much alcohol. If your health care provider says that alcohol is safe for you, follow these guidelines:  Limit alcohol intake to no more than 1 drink per day for nonpregnant women and 2 drinks per day for men. One drink equals 12 oz of beer, 5 oz of wine, or 1 oz of hard liquor.  Do not drink on an empty stomach.  Keep yourself hydrated with water, diet soda, or unsweetened iced tea.  Keep in mind that regular soda, juice, and other mixers may contain a lot of sugar and must be counted as carbs. What are tips for following this plan?  Reading food labels  Start  by checking the serving size on the "Nutrition Facts" label of packaged foods and drinks. The amount of calories, carbs, fats, and other nutrients listed on the label is based on one serving of the item. Many items contain more than one serving per package.  Check the total grams (g) of carbs in one serving. You can calculate the number of servings of carbs in one serving by dividing the total carbs by 15.  For example, if a food has 30 g of total carbs, it would be equal to 2 servings of carbs.  Check the number of grams (g) of saturated and trans fats in one serving. Choose foods that have low or no amount of these fats.  Check the number of milligrams (mg) of salt (sodium) in one serving. Most people should limit total sodium intake to less than 2,300 mg per day.  Always check the nutrition information of foods labeled as "low-fat" or "nonfat". These foods may be higher in added sugar or refined carbs and should be avoided.  Talk to your dietitian to identify your daily goals for nutrients listed on the label. Shopping  Avoid buying canned, premade, or processed foods. These foods tend to be high in fat, sodium, and added sugar.  Shop around the outside edge of the grocery store. This includes fresh fruits and vegetables, bulk grains, fresh meats, and fresh dairy. Cooking  Use low-heat cooking methods, such as baking, instead of high-heat cooking methods like deep frying.  Cook using healthy oils, such as olive, canola, or sunflower oil.  Avoid cooking with butter, cream, or high-fat meats. Meal planning  Eat meals and snacks regularly, preferably at the same times every day. Avoid going long periods of time without eating.  Eat foods high in fiber, such as fresh fruits, vegetables, beans, and whole grains. Talk to your dietitian about how many servings of carbs you can eat at each meal.  Eat 4-6 ounces (oz) of lean protein each day, such as lean meat, chicken, fish, eggs, or tofu. One oz of lean protein is equal to: ? 1 oz of meat, chicken, or fish. ? 1 egg. ?  cup of tofu.  Eat some foods each day that contain healthy fats, such as avocado, nuts, seeds, and fish. Lifestyle  Check your blood glucose regularly.  Exercise regularly as told by your health care provider. This may include: ? 150 minutes of moderate-intensity or vigorous-intensity exercise each week. This could be  brisk walking, biking, or water aerobics. ? Stretching and doing strength exercises, such as yoga or weightlifting, at least 2 times a week.  Take medicines as told by your health care provider.  Do not use any products that contain nicotine or tobacco, such as cigarettes and e-cigarettes. If you need help quitting, ask your health care provider.  Work with a Social worker or diabetes educator to identify strategies to manage stress and any emotional and social challenges. Questions to ask a health care provider  Do I need to meet with a diabetes educator?  Do I need to meet with a dietitian?  What number can I call if I have questions?  When are the best times to check my blood glucose? Where to find more information:  American Diabetes Association: diabetes.org  Academy of Nutrition and Dietetics: www.eatright.[employer] of Diabetes and Digestive and Kidney Diseases (NIH): [employer] Summary  A healthy meal plan will help you control your blood glucose and maintain a healthy lifestyle.  Working with a  diet and nutrition specialist (dietitian) can help you make a meal plan that is best for you.  Keep in mind that carbohydrates (carbs) and alcohol have immediate effects on your blood glucose levels. It is important to count carbs and to use alcohol carefully. This information is not intended to replace advice given to you by your health care provider. Make sure you discuss any questions you have with your health care provider. Document Revised: 04/29/2017 Document Reviewed: 06/21/2016 Elsevier Patient Education  2020 [employer].

## 2019-12-31 NOTE — Assessment & Plan Note (Signed)
No alcohol in 5 days, commended him on this. Discussed that if we were to start antidepressants that he would have to refrain from alcohol use.  He agrees. Continue to monitor.

## 2020-02-11 ENCOUNTER — Ambulatory Visit: Payer: Medicare Other | Admitting: Primary Care

## 2020-02-11 DIAGNOSIS — Z0289 Encounter for other administrative examinations: Secondary | ICD-10-CM

## 2020-09-03 IMAGING — CR DG RIBS W/ CHEST 3+V*L*
5 series · 5 of 5 positions shown · non-contrast
Comparison: None.

CLINICAL DATA: Anterior left chest pain after a fall.

EXAM:
LEFT RIBS AND CHEST - 3+ VIEW

[chest pa]
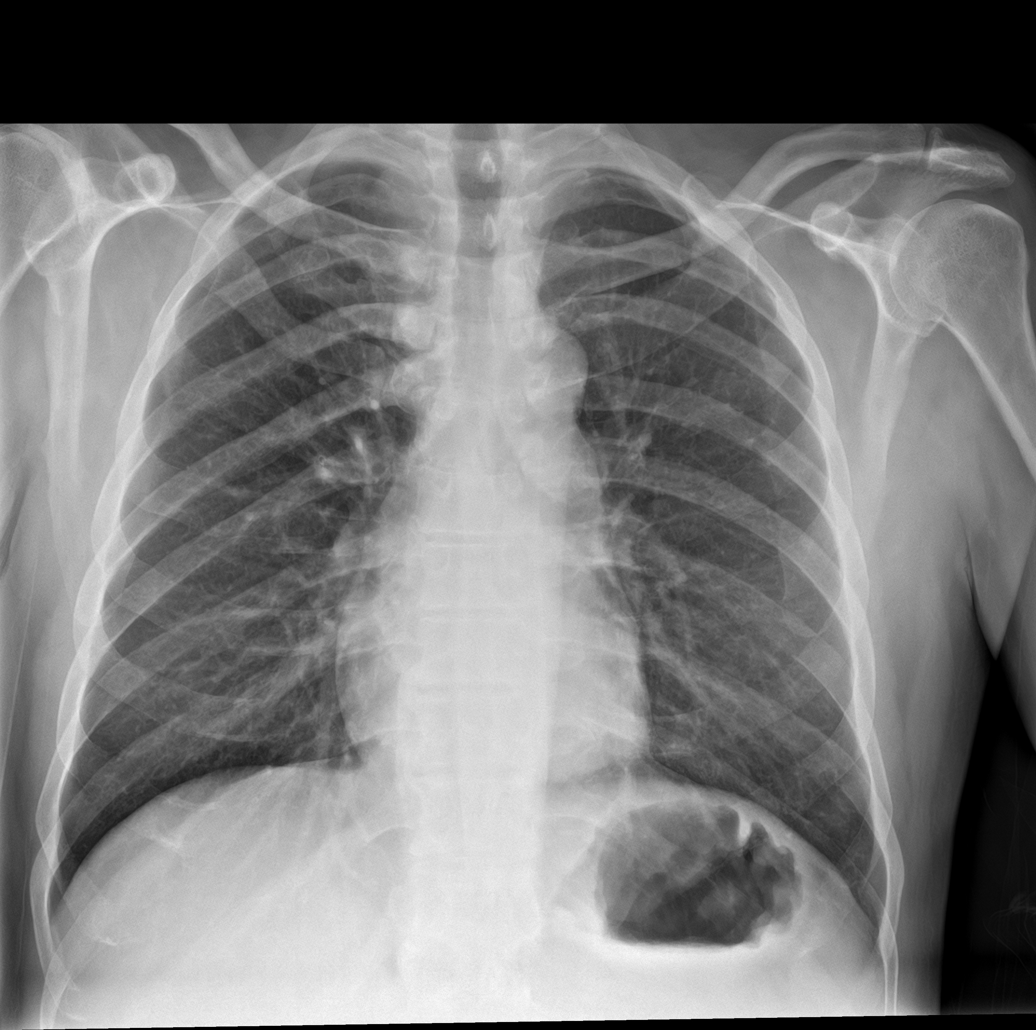

[rib pa]
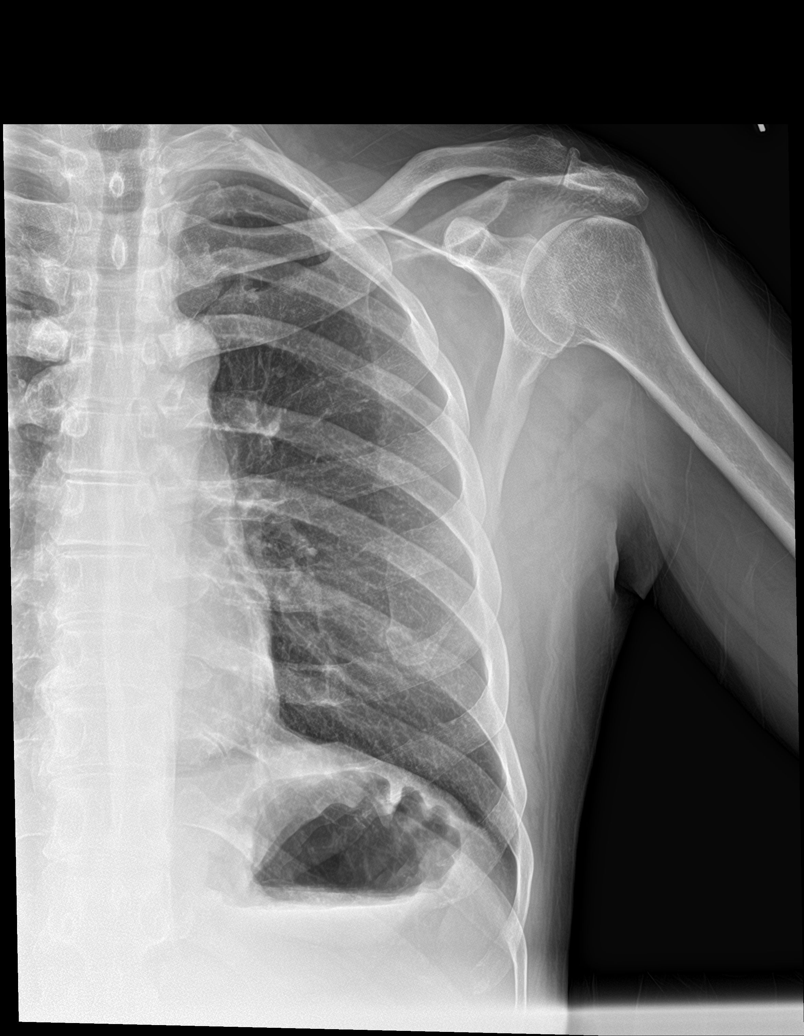

[rib pa obl]
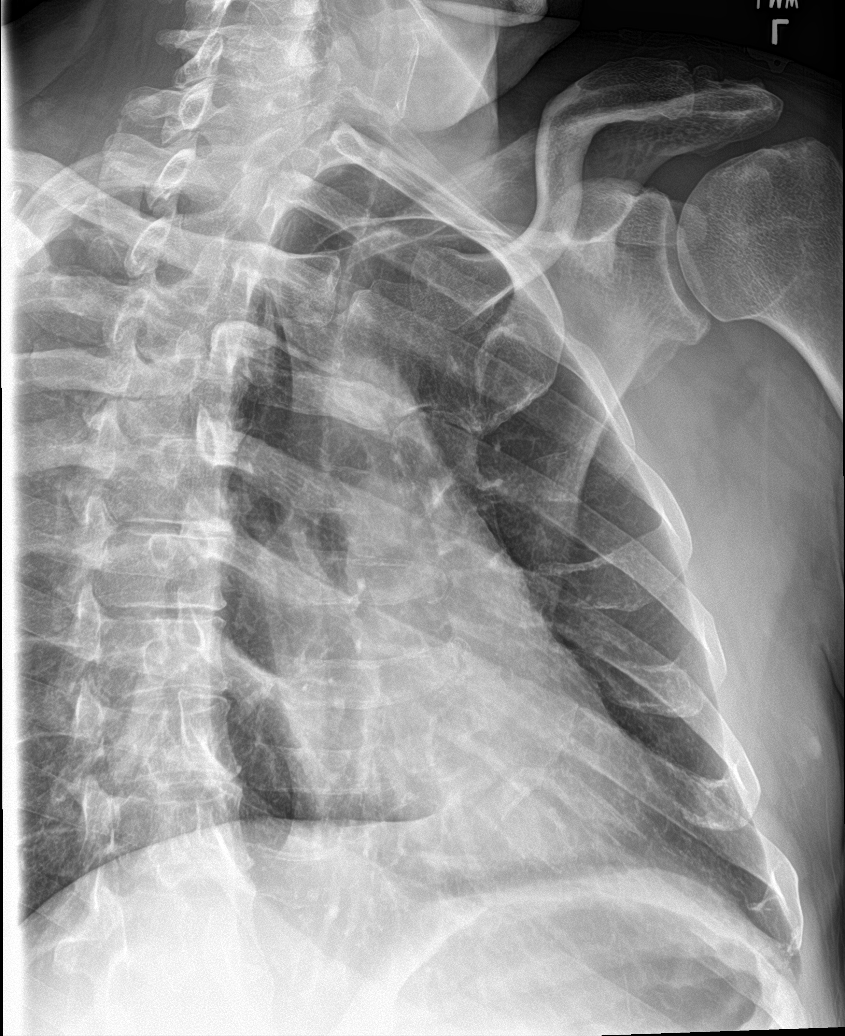

[rib ap]
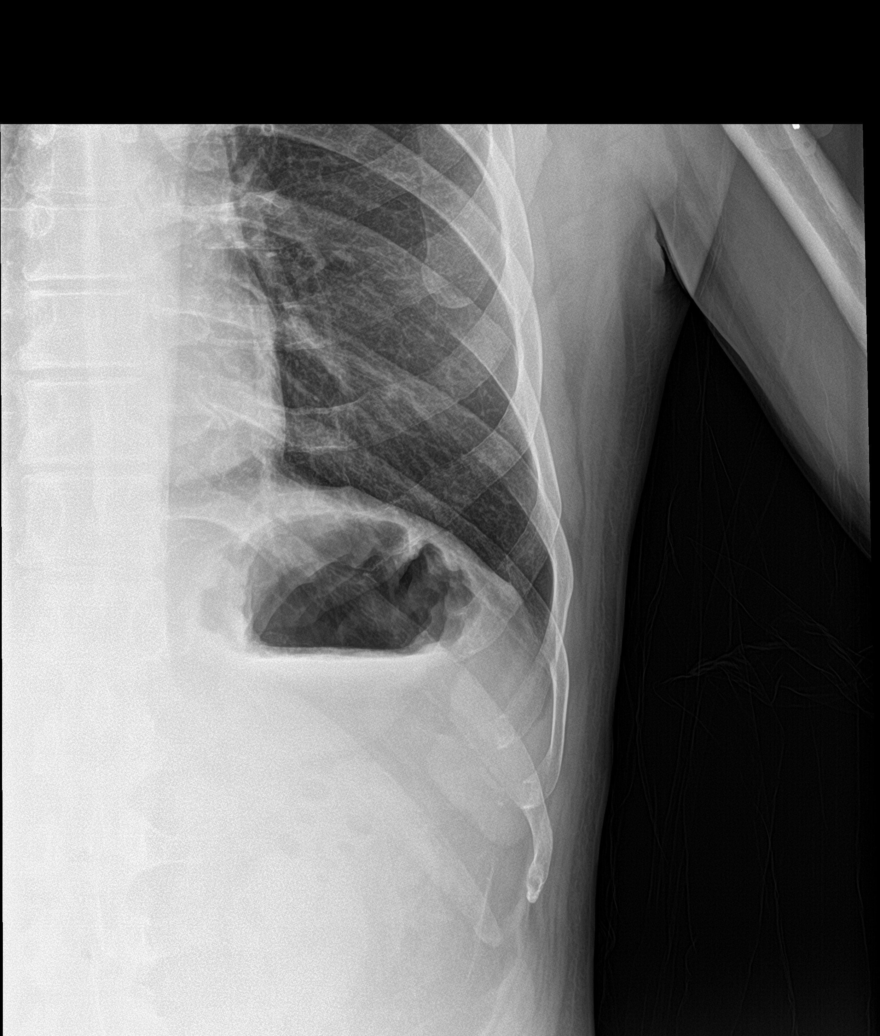

[rib ap obl]
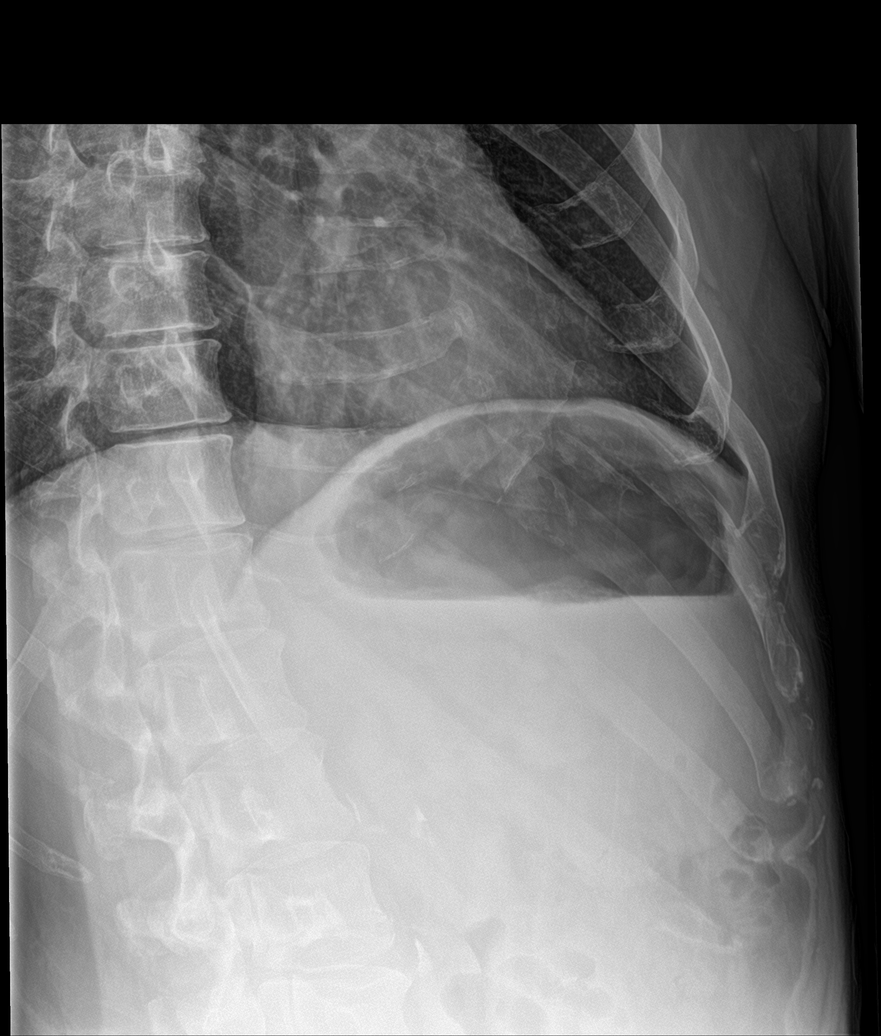

[5 of 5 positions shown; findings below may reference images not displayed]

FINDINGS: The lungs appear clear.  Cardiac and mediastinal contours normal.

No pleural effusion identified.

No pulmonary contusion or pneumothorax.

Suspected nondisplaced rib fractures of the left anterior sixth and
eighth ribs.
IMPRESSION: Nondisplaced anterior fractures left sixth and eighth ribs.

## 2021-08-04 ENCOUNTER — Telehealth: Payer: Self-pay | Admitting: Primary Care

## 2021-08-04 NOTE — Telephone Encounter (Signed)
Please call patient to see if he can come in on 3/14 at 3:20 PM. ?

## 2021-08-04 NOTE — Telephone Encounter (Signed)
[facility] called to schedule pt a HFU appt.. NO available appt within the 7-14 day range  ?

## 2021-08-06 ENCOUNTER — Telehealth: Payer: Self-pay

## 2021-08-06 NOTE — Telephone Encounter (Signed)
Okay to add patient on for 08/11/2021 at 3:40 slot. ?

## 2021-08-06 NOTE — Telephone Encounter (Signed)
Patient was released from [facility] 3/7. Needed to have follow up with PCP. Shaun Roberts is concerned that he is not eating and having a lot of trouble getting around. You do not have anything open do you want to add him on at a 340 Friday or Tuesday?  ? ?Let Shaun Roberts know I will call her tomorrow with date and time we can see him ?[phone] ?

## 2021-08-10 NOTE — Telephone Encounter (Signed)
Left message on voice mail for Shaun Roberts about time and date of follow up. I have sent message to [facility] and Amy to add to schedule. No longer have ability to add appointments.  ?

## 2021-08-11 ENCOUNTER — Other Ambulatory Visit: Payer: Self-pay

## 2021-08-11 ENCOUNTER — Ambulatory Visit (INDEPENDENT_AMBULATORY_CARE_PROVIDER_SITE_OTHER): Payer: Medicare Other | Admitting: Primary Care

## 2021-08-11 ENCOUNTER — Encounter: Payer: Self-pay | Admitting: Primary Care

## 2021-08-11 VITALS — BP 102/58 | HR 95 | Temp 97.7°F | Ht 70.5 in | Wt 179.0 lb

## 2021-08-11 DIAGNOSIS — J449 Chronic obstructive pulmonary disease, unspecified: Secondary | ICD-10-CM | POA: Diagnosis not present

## 2021-08-11 DIAGNOSIS — E1165 Type 2 diabetes mellitus with hyperglycemia: Secondary | ICD-10-CM

## 2021-08-11 DIAGNOSIS — D532 Scorbutic anemia: Secondary | ICD-10-CM | POA: Insufficient documentation

## 2021-08-11 DIAGNOSIS — F1021 Alcohol dependence, in remission: Secondary | ICD-10-CM

## 2021-08-11 DIAGNOSIS — Z720 Tobacco use: Secondary | ICD-10-CM | POA: Diagnosis not present

## 2021-08-11 DIAGNOSIS — I82431 Acute embolism and thrombosis of right popliteal vein: Secondary | ICD-10-CM | POA: Insufficient documentation

## 2021-08-11 DIAGNOSIS — E871 Hypo-osmolality and hyponatremia: Secondary | ICD-10-CM

## 2021-08-11 DIAGNOSIS — F331 Major depressive disorder, recurrent, moderate: Secondary | ICD-10-CM

## 2021-08-11 DIAGNOSIS — E559 Vitamin D deficiency, unspecified: Secondary | ICD-10-CM

## 2021-08-11 HISTORY — DX: Acute embolism and thrombosis of right popliteal vein: I82.431

## 2021-08-11 NOTE — Patient Instructions (Signed)
Continue apixaban 5 mg twice daily, this is the blood thinner medication.  You will need to take this for a total of 3 months. ? ?Start taking vitamin D 50,000 IU capsules once weekly for low vitamin D. ? ?Continue taking vitamin C daily. ? ?Ensure you are consuming 64 ounces of water daily. ? ?Continue taking the stool softener daily. ? ?Notify me if you need more refills of your apixaban blood thinner medicine before I see you next. ? ?It was a pleasure to see you today! ? ? ?

## 2021-08-11 NOTE — Assessment & Plan Note (Signed)
Discovered during recent hospitalization through [facility]. ? ?Reviewed ultrasound from care everywhere. ? ?Continue apixaban 5 mg twice daily for total of 3 months which will be through early June 2023.  We discussed this today and he will notify us if he needs refills of apixaban prior to his follow-up visit. ?

## 2021-08-11 NOTE — Assessment & Plan Note (Signed)
Patient denies concerns today. ? ?Remain off of sertraline and trazodone for now. ? ?Continue to monitor. ?

## 2021-08-11 NOTE — Progress Notes (Signed)
? ?Subjective:  ? ? Patient ID: Shaun Roberts, male    DOB: 1965-10-09, 56 y.o.   MRN: [id number] ? ?HPI ? ?Shaun Roberts is a very pleasant 56 y.o. male with a history of COPD, type 2 diabetes, alcohol abuse, tobacco abuse, MDD, insomnia who presents today for hospital follow-up. He has not been seen by me since August 2021. ? ?Admitted to [facility] on 08/01/2021 for symptoms of petechiae, exertional dyspnea, right lower extremity swelling, diarrhea, potential scurvy.  History of alcohol abuse and admitted to drinking 16 beers daily, has been doing so for years. ? ?During his hospital stay he was found to have a right popliteal DVT and was initiated on apixaban 5 mg twice daily. His ex-wife noticed progressive abdominal bruising approximately 1 week prior. During his stay he was also noted to have bruising to his bilateral thighs down to knees.  Hematology consulted regarding these findings and work-up for von Willebrand disease, acquired factor deficiency, DIC, and peripheral blood smear were unremarkable.  He had no evidence of malignancy on CT chest/abdomen/pelvis. ? ?Dermatology consulted who found extensive ecchymosis and parafollicular papules within the corkscrews of the hairs suggesting scurvy. ? ?Hospital labs were positive for hyponatremia, initial elevated PT/INR, but these levels corrected with hydration and nutritional intervention.  His A1c was checked during his hospital stay which was 5.7.  He has been off of metformin for over 1 year. ? ?He was discharged home on 08/04/2021, initiated on apixaban 5 mg twice daily for popliteal DVT, vitamin D 50,000 unit capsules once weekly for deficiency, and vitamin C daily. ? ?Since his discharge home he's feeling about the same. He is compliant to his apixaban 5 mg BID. He has not taken his Vitamin D, but has taken Vitamin C. He has been taking Aleve twice daily, everyday for pain to his right lower extremity, has been doing this for about 1 week.  ? ?He has  been contacted by home health PT who is planning on coming twice weekly for several weeks.  ? ?He quit drinking and smoking on March 4th. He was once managed on sertraline and trazodone for MDD/omnia, he denies concerns for depression today. He's not taken sertraline in over 1 year.  ? ?He's experienced constipation for the last 6 days. He had a firm and large stool followed by diarrhea today just prior to his visit with [place]. He saw dark red blood on his toilet paper when wiping. He's been taking a stool softener for the last 5 days. He denies rectal pain.  ? ?He is mostly drinking soda since coming home. Some water. His appetite has returned to normal. He denies chest pain, shortness of breath.  He has noticed improved breathing overall since he quit smoking.  His right lower extremity swelling has reduced. ? ?Review of Systems  ?Constitutional:  Positive for fatigue.  ?Cardiovascular:  Positive for leg swelling.  ?Gastrointestinal:  Positive for constipation. Negative for diarrhea, nausea and vomiting.  ?Hematological:  Bruises/bleeds easily.  ? ?   ? ? ?Past Medical History:  ?Diagnosis Date  ? Alcohol abuse   ? Alcohol withdrawal ([place]) 04/13/2018  ? Arthritis   ? Colon polyps   ? COPD (chronic obstructive pulmonary disease) ([place])   ? Depression   ? Diabetes mellitus without complication ([place])   ? Hyperlipidemia   ? Hypertension   ? ? ?Social History  ? ?Socioeconomic History  ? Marital status: Divorced  ?  Spouse name: Not on file  ?  Number of children: Not on file  ? Years of education: Not on file  ? Highest education level: Not on file  ?Occupational History  ? Not on file  ?Tobacco Use  ? Smoking status: Every Day  ?  Packs/day: 1.50  ?  Types: Cigarettes  ? Smokeless tobacco: Never  ?Substance and Sexual Activity  ? Alcohol use: Not Currently  ?  Comment: Hx of alcoholism  ? Drug use: Not on file  ? Sexual activity: Not on file  ?Other Topics Concern  ? Not on file  ?Social History Narrative  ? Not on file   ? ?Social Determinants of Health  ? ?Financial Resource Strain: Not on file  ?Food Insecurity: Not on file  ?Transportation Needs: Not on file  ?Physical Activity: Not on file  ?Stress: Not on file  ?Social Connections: Not on file  ?Intimate Partner Violence: Not on file  ? ? ?Past Surgical History:  ?Procedure Laterality Date  ? ANTERIOR CRUCIATE LIGAMENT REPAIR Right 2016  ? COLONOSCOPY WITH PROPOFOL N/A 05/18/2018  ? Procedure: COLONOSCOPY WITH BIOPSIES;  Surgeon: [name], MD;  Location: [place];  Service: Endoscopy;  Laterality: N/A;  ? KNEE RECONSTRUCTION    ? MEDIAL COLLATERAL LIGAMENT AND LATERAL COLLATERAL LIGAMENT REPAIR, KNEE Right 2016  ? POLYPECTOMY N/A 05/18/2018  ? Procedure: POLYPECTOMY;  Surgeon: [name], MD;  Location: [place];  Service: Endoscopy;  Laterality: N/A;  ? ? ?Family History  ?Problem Relation Age of Onset  ? Depression Mother   ? Diabetes Mother   ? Early death Mother   ? Kidney disease Mother   ? HIV/AIDS Mother   ? Alcohol abuse Father   ? Diabetes Father   ? Early death Father   ? Leukemia Father   ? Depression Sister   ? Diabetes Sister   ? Drug abuse Sister   ? Early death Sister   ? Cancer Sister   ? Early death Paternal Grandfather   ? Heart attack Paternal Grandfather   ? Heart disease Paternal Grandfather   ? ? ?No Known Allergies ? ?Current Outpatient Medications on File Prior to Visit  ?Medication Sig Dispense Refill  ? apixaban (ELIQUIS) 5 MG TABS tablet Take 2 tablet (10 mg) by mouth twice per day until 3/12, followed by 1 tablet (5 mg) by mouth twice daily.    ? ascorbic acid (VITAMIN C) 500 MG tablet Take by mouth.    ? Cholecalciferol 1.25 MG (50000 UT) capsule Take 1 capsule by mouth once a week.    ? naproxen sodium (ALEVE) 220 MG tablet Take 220 mg by mouth.    ? albuterol (PROVENTIL HFA;VENTOLIN HFA) 108 (90 Base) MCG/ACT inhaler Inhale 1-2 puffs into the lungs every 6 (six) hours as needed for wheezing or shortness of breath.  (Patient not taking: Reported on 08/11/2021) 1 Inhaler 0  ? famotidine (PEPCID) 20 MG tablet Take 20 mg by mouth 2 (two) times daily. (Patient not taking: Reported on 08/11/2021)    ? metFORMIN (GLUCOPHAGE XR) 500 MG 24 hr tablet Take 1 tablet (500 mg total) by mouth daily with breakfast. For diabetes. (Patient not taking: Reported on 08/11/2021) 90 tablet 3  ? sertraline (ZOLOFT) 50 MG tablet Take 1 tablet (50 mg total) by mouth daily. For depression. 30 tablet 1  ? traZODone (DESYREL) 150 MG tablet Take 1 tablet (150 mg total) by mouth at bedtime as needed for sleep. (Patient not taking: Reported on 08/11/2021) 90 tablet 1  ? ?  No current facility-administered medications on file prior to visit.  ? ? ?BP (!) 102/58   Pulse 95   Temp 97.7 ?F (36.5 ?C) (Oral)   Ht 5' 10.5" (1.791 m)   Wt 179 lb (81.2 kg)   SpO2 97%   BMI 25.32 kg/m?  ?Objective:  ? Physical Exam ?Constitutional:   ?   General: He is not in acute distress. ?Cardiovascular:  ?   Rate and Rhythm: Normal rate and regular rhythm.  ?   Comments: Mild right lower extremity swelling, mostly localized around the knee. ?Pulmonary:  ?   Effort: Pulmonary effort is normal.  ?   Breath sounds: Normal breath sounds. No wheezing or rales.  ?Musculoskeletal:  ?   Cervical back: Neck supple.  ?Skin: ?   General: Skin is warm and dry.  ?   Findings: Bruising present.  ?   Comments: Ecchymosis noted to the bilateral flanks, bilateral thighs, right buttocks and posterior thigh.  ?Neurological:  ?   Mental Status: He is alert and oriented to person, place, and time.  ?Psychiatric:     ?   Mood and Affect: Mood normal.  ? ? ? ? ? ?   ?Assessment & Plan:  ? ? ? ? ?This visit occurred during the SARS-CoV-2 public health emergency.  Safety protocols were in place, including screening questions prior to the visit, additional usage of staff PPE, and extensive cleaning of exam room while observing appropriate contact time as indicated for disinfecting solutions.  ?

## 2021-08-11 NOTE — Assessment & Plan Note (Signed)
Off diabetes medications for nearly 2 years. ? ?Reviewed A1c from care everywhere, [place], which is 5.7. ? ?Continue to monitor. ?Continue off medications. ?

## 2021-08-11 NOTE — Assessment & Plan Note (Signed)
Secondary to alcohol intake and poor p.o. intake. ?Reviewed labs from care everywhere through the [facility] system. ? ?Repeat BMP pending. ?

## 2021-08-11 NOTE — Assessment & Plan Note (Signed)
Commended him on smoking cessation which was effective 08/01/2021. ? ?Courage to continue cessation. ?

## 2021-08-11 NOTE — Assessment & Plan Note (Signed)
CT chest reviewed from care everywhere from [facility] hospital stay.  CT chest is benign. ? ?No complaints today. ?Continue to monitor. ?

## 2021-08-11 NOTE — Assessment & Plan Note (Addendum)
Repeat CBC is pending. ?Continue vitamin C supplementation. ? ?He has been contacted by home health PT, agree to sign off on orders. ? ?Hospital labs, notes, imaging reviewed from care everywhere through [facility] system. ?

## 2021-08-11 NOTE — Assessment & Plan Note (Signed)
Reviewed vitamin D level through care everywhere from [facility] system. ? ?Strongly advised patient start vitamin D 50,000 IU capsules once weekly. ? ?Will monitor. ?

## 2021-08-11 NOTE — Assessment & Plan Note (Signed)
Commended him on cessation from alcohol which is effective 08/01/2021. ? ?Courage him to remain off of alcohol. ?

## 2021-08-12 LAB — BASIC METABOLIC PANEL
BUN: 11 mg/dL (ref 6–23)
CO2: 26 mEq/L (ref 19–32)
Calcium: 9.1 mg/dL (ref 8.4–10.5)
Chloride: 107 mEq/L (ref 96–112)
Creatinine, Ser: 0.62 mg/dL (ref 0.40–1.50)
GFR: 106.51 mL/min (ref >60.00)
Glucose, Bld: 168 mg/dL — ABNORMAL HIGH (ref 70–99)
Potassium: 4.1 mEq/L (ref 3.5–5.1)
Sodium: 140 mEq/L (ref 135–145)

## 2021-08-12 LAB — CBC
HCT: 32.6 % — ABNORMAL LOW (ref 39.0–52.0)
Hemoglobin: 11.1 g/dL — ABNORMAL LOW (ref 13.0–17.0)
MCHC: 34 g/dL (ref 30.0–36.0)
MCV: 101.8 fl — ABNORMAL HIGH (ref 78.0–100.0)
Platelets: 127 10*3/uL — ABNORMAL LOW (ref 150.0–400.0)
RBC: 3.2 Mil/uL — ABNORMAL LOW (ref 4.22–5.81)
RDW: 18.6 % — ABNORMAL HIGH (ref 11.5–15.5)
WBC: 7.3 10*3/uL (ref 4.0–10.5)

## 2021-08-12 NOTE — Telephone Encounter (Signed)
Patient was reached and seen in office for appointment no further action  ?

## 2021-08-19 ENCOUNTER — Telehealth: Payer: Self-pay

## 2021-08-19 NOTE — Telephone Encounter (Signed)
-----   Message from Pleas Koch, NP sent at 08/12/2021  4:57 PM EDT ----- ?Please call patient: ? ?Blood counts are improving compared to labs from his hospital stay.   ?Electrolytes and kidney function have improved. ? ?We will see him back in a month as scheduled ? ?

## 2021-08-19 NOTE — Telephone Encounter (Signed)
Tried to call patient regarding lab results not able to reach patient , phone wasn't service. ?

## 2021-08-20 ENCOUNTER — Ambulatory Visit: Payer: Medicare Other | Admitting: Primary Care

## 2021-09-11 ENCOUNTER — Ambulatory Visit: Payer: Medicare Other | Admitting: Primary Care

## 2021-10-15 ENCOUNTER — Telehealth: Payer: Self-pay | Admitting: Primary Care

## 2021-10-15 NOTE — Telephone Encounter (Signed)
Tried calling patient  to  schedule Medicare Annual Wellness Visit (AWV) either virtually or phone   awvi 11/28/20 per [facility]   please schedule at anytime with health coach  This should be a 45 minute visit.   I left my direct # [phone]

## 2022-09-03 ENCOUNTER — Inpatient Hospital Stay
Admission: EM | Admit: 2022-09-03 | Discharge: 2022-09-09 | DRG: 377 | Disposition: A | Discharge status: Skilled Nursing Facility | Payer: Medicare Other | Attending: Internal Medicine | Admitting: Internal Medicine

## 2022-09-03 ENCOUNTER — Emergency Department: Payer: Medicare Other

## 2022-09-03 DIAGNOSIS — E785 Hyperlipidemia, unspecified: Secondary | ICD-10-CM | POA: Diagnosis present

## 2022-09-03 DIAGNOSIS — K766 Portal hypertension: Secondary | ICD-10-CM | POA: Diagnosis present

## 2022-09-03 DIAGNOSIS — E860 Dehydration: Secondary | ICD-10-CM | POA: Diagnosis present

## 2022-09-03 DIAGNOSIS — E8729 Other acidosis: Secondary | ICD-10-CM | POA: Diagnosis present

## 2022-09-03 DIAGNOSIS — E119 Type 2 diabetes mellitus without complications: Secondary | ICD-10-CM | POA: Diagnosis not present

## 2022-09-03 DIAGNOSIS — K3189 Other diseases of stomach and duodenum: Secondary | ICD-10-CM | POA: Diagnosis present

## 2022-09-03 DIAGNOSIS — I10 Essential (primary) hypertension: Secondary | ICD-10-CM | POA: Diagnosis present

## 2022-09-03 DIAGNOSIS — E876 Hypokalemia: Secondary | ICD-10-CM | POA: Insufficient documentation

## 2022-09-03 DIAGNOSIS — E512 Wernicke's encephalopathy: Secondary | ICD-10-CM | POA: Diagnosis present

## 2022-09-03 DIAGNOSIS — F1010 Alcohol abuse, uncomplicated: Secondary | ICD-10-CM

## 2022-09-03 DIAGNOSIS — Z833 Family history of diabetes mellitus: Secondary | ICD-10-CM

## 2022-09-03 DIAGNOSIS — Z841 Family history of disorders of kidney and ureter: Secondary | ICD-10-CM

## 2022-09-03 DIAGNOSIS — N179 Acute kidney failure, unspecified: Secondary | ICD-10-CM

## 2022-09-03 DIAGNOSIS — E1110 Type 2 diabetes mellitus with ketoacidosis without coma: Secondary | ICD-10-CM | POA: Diagnosis present

## 2022-09-03 DIAGNOSIS — E43 Unspecified severe protein-calorie malnutrition: Secondary | ICD-10-CM | POA: Diagnosis present

## 2022-09-03 DIAGNOSIS — L8990 Pressure ulcer of unspecified site, unspecified stage: Secondary | ICD-10-CM | POA: Insufficient documentation

## 2022-09-03 DIAGNOSIS — K254 Chronic or unspecified gastric ulcer with hemorrhage: Secondary | ICD-10-CM | POA: Diagnosis present

## 2022-09-03 DIAGNOSIS — Z86718 Personal history of other venous thrombosis and embolism: Secondary | ICD-10-CM | POA: Diagnosis not present

## 2022-09-03 DIAGNOSIS — K2901 Acute gastritis with bleeding: Secondary | ICD-10-CM | POA: Diagnosis not present

## 2022-09-03 DIAGNOSIS — Z818 Family history of other mental and behavioral disorders: Secondary | ICD-10-CM

## 2022-09-03 DIAGNOSIS — E861 Hypovolemia: Secondary | ICD-10-CM | POA: Diagnosis present

## 2022-09-03 DIAGNOSIS — Z79899 Other long term (current) drug therapy: Secondary | ICD-10-CM | POA: Diagnosis not present

## 2022-09-03 DIAGNOSIS — K922 Gastrointestinal hemorrhage, unspecified: Secondary | ICD-10-CM

## 2022-09-03 DIAGNOSIS — D696 Thrombocytopenia, unspecified: Secondary | ICD-10-CM | POA: Diagnosis not present

## 2022-09-03 DIAGNOSIS — E871 Hypo-osmolality and hyponatremia: Secondary | ICD-10-CM | POA: Diagnosis present

## 2022-09-03 DIAGNOSIS — Z7901 Long term (current) use of anticoagulants: Secondary | ICD-10-CM

## 2022-09-03 DIAGNOSIS — K7581 Nonalcoholic steatohepatitis (NASH): Secondary | ICD-10-CM | POA: Diagnosis present

## 2022-09-03 DIAGNOSIS — Z809 Family history of malignant neoplasm, unspecified: Secondary | ICD-10-CM

## 2022-09-03 DIAGNOSIS — K260 Acute duodenal ulcer with hemorrhage: Secondary | ICD-10-CM | POA: Diagnosis present

## 2022-09-03 DIAGNOSIS — Z6823 Body mass index (BMI) 23.0-23.9, adult: Secondary | ICD-10-CM

## 2022-09-03 DIAGNOSIS — Z8719 Personal history of other diseases of the digestive system: Secondary | ICD-10-CM

## 2022-09-03 DIAGNOSIS — Z806 Family history of leukemia: Secondary | ICD-10-CM

## 2022-09-03 DIAGNOSIS — K2921 Alcoholic gastritis with bleeding: Secondary | ICD-10-CM | POA: Diagnosis present

## 2022-09-03 DIAGNOSIS — D61818 Other pancytopenia: Secondary | ICD-10-CM | POA: Diagnosis present

## 2022-09-03 DIAGNOSIS — L89612 Pressure ulcer of right heel, stage 2: Secondary | ICD-10-CM | POA: Diagnosis present

## 2022-09-03 DIAGNOSIS — D62 Acute posthemorrhagic anemia: Secondary | ICD-10-CM

## 2022-09-03 DIAGNOSIS — R627 Adult failure to thrive: Secondary | ICD-10-CM | POA: Insufficient documentation

## 2022-09-03 DIAGNOSIS — D689 Coagulation defect, unspecified: Secondary | ICD-10-CM | POA: Diagnosis present

## 2022-09-03 DIAGNOSIS — R680 Hypothermia, not associated with low environmental temperature: Secondary | ICD-10-CM | POA: Diagnosis present

## 2022-09-03 DIAGNOSIS — F17210 Nicotine dependence, cigarettes, uncomplicated: Secondary | ICD-10-CM | POA: Diagnosis present

## 2022-09-03 DIAGNOSIS — I959 Hypotension, unspecified: Secondary | ICD-10-CM | POA: Diagnosis not present

## 2022-09-03 DIAGNOSIS — J449 Chronic obstructive pulmonary disease, unspecified: Secondary | ICD-10-CM | POA: Diagnosis present

## 2022-09-03 DIAGNOSIS — Z813 Family history of other psychoactive substance abuse and dependence: Secondary | ICD-10-CM

## 2022-09-03 DIAGNOSIS — Z830 Family history of human immunodeficiency virus [HIV] disease: Secondary | ICD-10-CM

## 2022-09-03 DIAGNOSIS — K5900 Constipation, unspecified: Secondary | ICD-10-CM | POA: Diagnosis present

## 2022-09-03 DIAGNOSIS — Z811 Family history of alcohol abuse and dependence: Secondary | ICD-10-CM

## 2022-09-03 DIAGNOSIS — Z539 Procedure and treatment not carried out, unspecified reason: Secondary | ICD-10-CM | POA: Diagnosis present

## 2022-09-03 DIAGNOSIS — Z91148 Patient's other noncompliance with medication regimen for other reason: Secondary | ICD-10-CM

## 2022-09-03 DIAGNOSIS — Z8249 Family history of ischemic heart disease and other diseases of the circulatory system: Secondary | ICD-10-CM

## 2022-09-03 DIAGNOSIS — I951 Orthostatic hypotension: Secondary | ICD-10-CM

## 2022-09-03 LAB — URINALYSIS, ROUTINE W REFLEX MICROSCOPIC
Glucose, UA: 150 mg/dL — AB
Hgb urine dipstick: NEGATIVE
Ketones, ur: 5 mg/dL — AB
Leukocytes,Ua: NEGATIVE
Nitrite: NEGATIVE
Protein, ur: NEGATIVE mg/dL
Specific Gravity, Urine: 1.02 (ref 1.005–1.030)
pH: 5 (ref 5.0–8.0)

## 2022-09-03 LAB — ABO/RH: ABO/RH(D): A POS

## 2022-09-03 LAB — BLOOD GAS, VENOUS
Acid-base deficit: 8.2 mmol/L — ABNORMAL HIGH (ref 0.0–2.0)
Bicarbonate: 16 mmol/L — ABNORMAL LOW (ref 20.0–28.0)
O2 Saturation: 37.8 %
Patient temperature: 37
pCO2, Ven: 29 mmHg — ABNORMAL LOW (ref 44–60)
pH, Ven: 7.35 (ref 7.25–7.43)
pO2, Ven: 33 mmHg (ref 32–45)

## 2022-09-03 LAB — COMPREHENSIVE METABOLIC PANEL
ALT: 45 U/L — ABNORMAL HIGH (ref 0–44)
AST: 54 U/L — ABNORMAL HIGH (ref 15–41)
Albumin: 2.7 g/dL — ABNORMAL LOW (ref 3.5–5.0)
Alkaline Phosphatase: 37 U/L — ABNORMAL LOW (ref 38–126)
Anion gap: 16 — ABNORMAL HIGH (ref 5–15)
BUN: 51 mg/dL — ABNORMAL HIGH (ref 6–20)
CO2: 17 mmol/L — ABNORMAL LOW (ref 22–32)
Calcium: 9.1 mg/dL (ref 8.9–10.3)
Chloride: 100 mmol/L (ref 98–111)
Creatinine, Ser: 1.27 mg/dL — ABNORMAL HIGH (ref 0.61–1.24)
GFR, Estimated: >60 mL/min (ref >60)
Glucose, Bld: 310 mg/dL — ABNORMAL HIGH (ref 70–99)
Potassium: 4.5 mmol/L (ref 3.5–5.1)
Sodium: 133 mmol/L — ABNORMAL LOW (ref 135–145)
Total Bilirubin: 5.9 mg/dL — ABNORMAL HIGH (ref 0.3–1.2)
Total Protein: 7.3 g/dL (ref 6.5–8.1)

## 2022-09-03 LAB — CBC WITH DIFFERENTIAL/PLATELET
Abs Immature Granulocytes: 0.06 10*3/uL (ref 0.00–0.07)
Basophils Absolute: 0 10*3/uL (ref 0.0–0.1)
Basophils Relative: 0 %
Eosinophils Absolute: 0 10*3/uL (ref 0.0–0.5)
Eosinophils Relative: 0 %
HCT: 20.5 % — ABNORMAL LOW (ref 39.0–52.0)
Hemoglobin: 6.6 g/dL — ABNORMAL LOW (ref 13.0–17.0)
Immature Granulocytes: 1 %
Lymphocytes Relative: 24 %
Lymphs Abs: 1.5 10*3/uL (ref 0.7–4.0)
MCH: 31.9 pg (ref 26.0–34.0)
MCHC: 32.2 g/dL (ref 30.0–36.0)
MCV: 99 fL (ref 80.0–100.0)
Monocytes Absolute: 0.2 10*3/uL (ref 0.1–1.0)
Monocytes Relative: 3 %
Neutro Abs: 4.4 10*3/uL (ref 1.7–7.7)
Neutrophils Relative %: 72 %
Platelets: 70 10*3/uL — ABNORMAL LOW (ref 150–400)
RBC: 2.07 MIL/uL — ABNORMAL LOW (ref 4.22–5.81)
RDW: 14.5 % (ref 11.5–15.5)
Smear Review: NORMAL
WBC: 6.1 10*3/uL (ref 4.0–10.5)
nRBC: 4.4 % — ABNORMAL HIGH (ref 0.0–0.2)

## 2022-09-03 LAB — TYPE AND SCREEN

## 2022-09-03 LAB — BETA-HYDROXYBUTYRIC ACID: Beta-Hydroxybutyric Acid: 1.53 mmol/L — ABNORMAL HIGH (ref 0.05–0.27)

## 2022-09-03 LAB — PREPARE RBC (CROSSMATCH)

## 2022-09-03 LAB — LACTIC ACID, PLASMA
Lactic Acid, Venous: 5 mmol/L (ref 0.5–1.9)
Lactic Acid, Venous: 4.5 mmol/L (ref 0.5–1.9)

## 2022-09-03 LAB — TROPONIN I (HIGH SENSITIVITY)
Troponin I (High Sensitivity): 3 ng/L (ref <18)
Troponin I (High Sensitivity): 4 ng/L (ref <18)

## 2022-09-03 LAB — PROTIME-INR
INR: 1.8 — ABNORMAL HIGH (ref 0.8–1.2)
Prothrombin Time: 20.3 seconds — ABNORMAL HIGH (ref 11.4–15.2)

## 2022-09-03 LAB — ETHANOL: Alcohol, Ethyl (B): <10 mg/dL (ref <10)

## 2022-09-03 LAB — MAGNESIUM: Magnesium: 2.4 mg/dL (ref 1.7–2.4)

## 2022-09-03 MED ORDER — PANTOPRAZOLE SODIUM 40 MG IV SOLR
40.0000 mg | Freq: Once | INTRAVENOUS | Status: AC
  Administered 2022-09-03: 40 mg via INTRAVENOUS
  Filled 2022-09-03: qty 10

## 2022-09-03 MED ORDER — INSULIN ASPART 100 UNIT/ML IJ SOLN
5.0000 [IU] | Freq: Once | INTRAMUSCULAR | Status: AC
  Administered 2022-09-03: 5 [IU] via INTRAVENOUS
  Filled 2022-09-03: qty 1

## 2022-09-03 MED ORDER — ACETAMINOPHEN 650 MG RE SUPP: 650.0000 mg | Freq: Four times a day (QID) | RECTAL | Status: DC | PRN

## 2022-09-03 MED ORDER — INSULIN ASPART 100 UNIT/ML IJ SOLN
0.0000 [IU] | INTRAMUSCULAR | Status: DC
  Administered 2022-09-04: 4 [IU] via SUBCUTANEOUS
  Administered 2022-09-04: 7 [IU] via SUBCUTANEOUS
  Filled 2022-09-03 (×2): qty 1

## 2022-09-03 MED ORDER — ONDANSETRON HCL 4 MG/2ML IJ SOLN: 4.0000 mg | Freq: Four times a day (QID) | INTRAMUSCULAR | Status: DC | PRN

## 2022-09-03 MED ORDER — ACETAMINOPHEN 325 MG PO TABS: 650.0000 mg | ORAL_TABLET | Freq: Four times a day (QID) | ORAL | Status: DC | PRN

## 2022-09-03 MED ORDER — TRAZODONE HCL 50 MG PO TABS: 25.0000 mg | ORAL_TABLET | Freq: Every evening | ORAL | Status: DC | PRN

## 2022-09-03 MED ORDER — SODIUM CHLORIDE 0.9 % IV SOLN: INTRAVENOUS | Status: DC

## 2022-09-03 MED ORDER — PANTOPRAZOLE SODIUM 40 MG IV SOLR
40.0000 mg | Freq: Two times a day (BID) | INTRAVENOUS | Status: DC
  Administered 2022-09-04 – 2022-09-09 (×12): 40 mg via INTRAVENOUS
  Filled 2022-09-03 (×11): qty 10

## 2022-09-03 MED ORDER — SODIUM CHLORIDE 0.9 % IV SOLN
10.0000 mL/h | Freq: Once | INTRAVENOUS | Status: AC
  Administered 2022-09-04: 10 mL/h via INTRAVENOUS

## 2022-09-03 MED ORDER — THIAMINE HCL 100 MG/ML IJ SOLN
500.0000 mg | Freq: Once | INTRAVENOUS | Status: AC
  Administered 2022-09-04: 500 mg via INTRAVENOUS
  Filled 2022-09-03: qty 5

## 2022-09-03 MED ORDER — SODIUM CHLORIDE 0.9 % IV BOLUS
1000.0000 mL | Freq: Once | INTRAVENOUS | Status: AC
  Administered 2022-09-03: 1000 mL via INTRAVENOUS

## 2022-09-03 MED ORDER — ONDANSETRON HCL 4 MG PO TABS: 4.0000 mg | ORAL_TABLET | Freq: Four times a day (QID) | ORAL | Status: DC | PRN

## 2022-09-03 NOTE — ED Notes (Signed)
This EDT was walking by this pts room and heard the monitor going off. EDT came in the room to see what was going on with the monitor. Pts blood pressure was low, causing the monitor to alarm. Blood pressure read 88/62 (67). EDT retook pts VS and documented the updated VS in the pts chart.   Pt denied needing to be repositioned in bed. Call light still within reach, both bed rails up for pt safety, and family member at bedside. Pt and family member denied needing anything else at this time.

## 2022-09-03 NOTE — ED Provider Notes (Signed)
[facility] Provider Note    Event Date/Time   First MD Initiated Contact with Patient 09/03/22 2001     (approximate)   History   Chief Complaint Failure To Thrive   HPI  Shaun Roberts is a 57 y.o. male with past medical history of hypertension, hyperlipidemia, diabetes, COPD, DVT, and alcohol abuse who presents to the ED complaining of generalized weakness.  Family reportedly called EMS earlier this evening as patient has not had any oral intake over the past 3 to 4 days other than alcohol.  Patient admits that he drinks "74 ounces" of beer daily, but he denies any liquor consumption or drug use.  He states he has been feeling generally weak and has not wanted to get up out of bed, but he denies any fevers, cough, chest pain, shortness of breath, nausea, vomiting, diarrhea, or dysuria.  EMS reports that patient initially had low blood pressure of 77/52, was given 500 cc of IV fluids with improvement to 99/50.  Patient reports his last alcoholic drink was around 3 AM this morning, denies history of DTs or seizures.     Physical Exam   Triage Vital Signs: ED Triage Vitals [09/03/22 2011]  Enc Vitals Group     BP      Pulse      Resp      Temp      Temp src      SpO2      Weight 170 lb 13.7 oz (77.5 kg)     Height      Head Circumference      Peak Flow      Pain Score 0     Pain Loc      Pain Edu?      Excl. in GC?     Most recent vital signs: Vitals:   09/03/22 2235 09/03/22 2237  BP: (!) 88/62 102/61  Pulse: (!) 109 (!) 109  Resp: (!) 21 17  Temp:    SpO2: 100% 100%    Constitutional: Alert and oriented to person, place, and time, but not situation. Eyes: Conjunctivae are normal.  Scleral icterus noted.  Lateral nystagmus noted with extraocular movement testing. Head: Atraumatic. Nose: No congestion/rhinnorhea. Mouth/Throat: Mucous membranes are dry. Cardiovascular: Normal rate, regular rhythm. Grossly normal heart sounds.  2+ radial  pulses bilaterally. Respiratory: Normal respiratory effort.  No retractions. Lungs CTAB. Gastrointestinal: Soft and nontender. No distention. Musculoskeletal: Jaundice noted.  No lower extremity tenderness nor edema.  Neurologic:  Normal speech and language. No gross focal neurologic deficits are appreciated.    ED Results / Procedures / Treatments   Labs (all labs ordered are listed, but only abnormal results are displayed) Labs Reviewed  URINALYSIS, ROUTINE W REFLEX MICROSCOPIC - Abnormal; Notable for the following components:      Result Value   Color, Urine AMBER (*)    APPearance CLEAR (*)    Glucose, UA 150 (*)    Bilirubin Urine SMALL (*)    Ketones, ur 5 (*)    All other components within normal limits  LACTIC ACID, PLASMA - Abnormal; Notable for the following components:   Lactic Acid, Venous 5.0 (*)    All other components within normal limits  CBC WITH DIFFERENTIAL/PLATELET - Abnormal; Notable for the following components:   RBC 2.07 (*)    Hemoglobin 6.6 (*)    HCT 20.5 (*)    Platelets 70 (*)    nRBC 4.4 (*)  All other components within normal limits  COMPREHENSIVE METABOLIC PANEL - Abnormal; Notable for the following components:   Sodium 133 (*)    CO2 17 (*)    Glucose, Bld 310 (*)    BUN 51 (*)    Creatinine, Ser 1.27 (*)    Albumin 2.7 (*)    AST 54 (*)    ALT 45 (*)    Alkaline Phosphatase 37 (*)    Total Bilirubin 5.9 (*)    Anion gap 16 (*)    All other components within normal limits  PROTIME-INR - Abnormal; Notable for the following components:   Prothrombin Time 20.3 (*)    INR 1.8 (*)    All other components within normal limits  BLOOD GAS, VENOUS - Abnormal; Notable for the following components:   pCO2, Ven 29 (*)    Bicarbonate 16.0 (*)    Acid-base deficit 8.2 (*)    All other components within normal limits  CULTURE, BLOOD (ROUTINE X 2)  CULTURE, BLOOD (ROUTINE X 2)  MAGNESIUM  ETHANOL  LACTIC ACID, PLASMA  BETA-HYDROXYBUTYRIC  ACID  PREPARE RBC (CROSSMATCH)  TYPE AND SCREEN  ABO/RH  TROPONIN I (HIGH SENSITIVITY)  TROPONIN I (HIGH SENSITIVITY)     EKG  ED ECG REPORT I, Chesley Noonharles Manika Hast, the attending physician, personally viewed and interpreted this ECG.   Date: 09/03/2022  EKG Time: 21:10  Rate: 107  Rhythm: sinus tachycardia  Axis: Normal  Intervals:none  ST&T Change: None  RADIOLOGY Chest x-ray reviewed and interpreted by me with no infiltrate, edema, or effusion.  PROCEDURES:  Critical Care performed: Yes, see critical care procedure note(s)  .Critical Care  Performed by: Chesley NoonJessup, Shenicka Sunderlin, MD Authorized by: Chesley NoonJessup, Duel Conrad, MD   Critical care provider statement:    Critical care time (minutes):  30   Critical care time was exclusive of:  Separately billable procedures and treating other patients and teaching time   Critical care was necessary to treat or prevent imminent or life-threatening deterioration of the following conditions:  Hepatic failure and metabolic crisis   Critical care was time spent personally by me on the following activities:  Development of treatment plan with patient or surrogate, discussions with consultants, evaluation of patient's response to treatment, examination of patient, ordering and review of laboratory studies, ordering and review of radiographic studies, ordering and performing treatments and interventions, pulse oximetry, re-evaluation of patient's condition and review of old charts   I assumed direction of critical care for this patient from another provider in my specialty: no     Care discussed with: admitting provider      MEDICATIONS ORDERED IN ED: Medications  thiamine (VITAMIN B1) 500 mg in sodium chloride 0.9 % 50 mL IVPB (has no administration in time range)  0.9 %  sodium chloride infusion (has no administration in time range)  sodium chloride 0.9 % bolus 1,000 mL (0 mLs Intravenous Stopped 09/03/22 2150)  pantoprazole (PROTONIX) injection 40 mg (40  mg Intravenous Given 09/03/22 2309)  insulin aspart (novoLOG) injection 5 Units (5 Units Intravenous Given 09/03/22 2316)  sodium chloride 0.9 % bolus 1,000 mL (1,000 mLs Intravenous New Bag/Given 09/03/22 2308)     IMPRESSION / MDM / ASSESSMENT AND PLAN / ED COURSE  I reviewed the triage vital signs and the nursing notes.                              57 y.o. male with  past medical history of hypertension, hyperlipidemia, diabetes, COPD, alcohol abuse, and DVT who presents to the ED complaining of increasing generalized weakness and poor p.o. intake over the past 3 to 4 days, admits to regular alcohol consumption.  Patient's presentation is most consistent with acute presentation with potential threat to life or bodily function.  Differential diagnosis includes, but is not limited to, dehydration, electrolyte abnormality, AKI, renal failure, liver failure, UTI, pneumonia, Warnicke's encephalopathy.  Patient ill-appearing but in no acute distress, vital signs remarkable for tachycardia and borderline hypotension along with hypothermia.  No obvious source of infection, but we will initiate sepsis workup given abnormal vital signs, hold off on broad-spectrum antibiotics for now.  Plan to hydrate with IV fluids and reassess following initial results, but presentation concerning for component of Wernicke's encephalopathy and we will start high-dose IV thiamine.  Labs remarkable for significant anemia with hemoglobin of 6.6.  Patient denies any recent bleeding, however rectal exam with dark brown guaiac positive stool.  We will transfuse 1 unit PRBCs and started on IV Protonix, bleeding seems to be relatively slow at this time and I doubt variceal bleed.  CMP shows elevated bilirubin, likely secondary to chronic alcohol abuse.  Patient also hyperglycemic with mildly increased anion gap and mild acidosis.  This is likely a combination of alcoholic ketoacidosis and mild diabetic ketoacidosis.  We will continue  IV fluid hydration and give 5 units of IV insulin, but do not feel patient requires insulin drip at this time.  Lactic acid elevated and we will trend, troponin within normal limits.  Chest x-ray and urinalysis showed no signs of infection and we will hold off on antibiotics.  Case discussed with hospitalist for admission.      FINAL CLINICAL IMPRESSION(S) / ED DIAGNOSES   Final diagnoses:  Alcohol abuse  Gastrointestinal hemorrhage, unspecified gastrointestinal hemorrhage type  Wernicke encephalopathy  Alcoholic ketoacidosis     Rx / DC Orders   ED Discharge Orders     None        Note:  This document was prepared using Dragon voice recognition software and may include unintentional dictation errors.   Chesley NoonJessup, Samyah Bilbo, MD 09/03/22 [phone]

## 2022-09-03 NOTE — ED Triage Notes (Signed)
Arrives to ER via  [facility] for reduced PO intake and urinary output per family's request.  Patient has not had any PO intake the past 3-4 days other than alcoholic beverages.  Per patient he's has had several beers daily the past several days, last ETOH intake this afternoon per EMS.  Per EMS family states patient has not had any urinary or fecal output the past 3-4 days as well.  Upon EMS arrival patient was hypotensive with BP of 77/52, given 500cc NS en route with improvement of BP to 99/50.  CBG on scene was 406, hx Dm type II, non-complaint with medications per EMS.   Appears mildly jaundiced.  Patient is AOx4, but a poor historian.

## 2022-09-03 NOTE — H&P (Signed)
[place]   PATIENT NAME: Shaun Roberts    MR#:  [id number]  DATE OF BIRTH:  Jan 26, 1966  DATE OF ADMISSION:  09/03/2022  PRIMARY CARE PHYSICIAN: Doreene Nest, NP   Patient is coming from: Home  REQUESTING/REFERRING PHYSICIAN: Chesley Noon, MD  CHIEF COMPLAINT:   Chief Complaint  Patient presents with   Failure To Thrive    HISTORY OF PRESENT ILLNESS:  Shaun Roberts is a 57 y.o. Caucasian male with medical history significant for COPD, depression, DVT, type 2 diabetes mellitus, hypertension, dyslipidemia and alcohol abuse, who presented to the emergency room with acute onset of generalized weakness with failure to thrive.  His family called EMS evening as the patient has not had any oral intake over the last 3 to 4 days other than alcohol.  He admits to drinking 74 ounces of beer daily but denied any other liquor intake.  He has been having generalized weakness to the point he was not eager to get out of bed.  He has been having mild headache and dizziness.  No paresthesias or focal muscle weakness.  He had chest pain that felt like heartburn with no palpitations.  No dyspnea or cough or wheezing.   No fever or chills.  No nausea or vomiting or diarrhea.  No dysuria, oliguria or hematuria or flank pain.  EMS reported hypotension with BP of 77/52 for which she was given 500 mill IV normal saline bolus with improvement of his BP to 99/50.  His last alcoholic drink was 3 AM in the morning.  No jitteriness or tremors and no witnessed seizures.  ED Course: When the patient came to the ER, BP was 87/63 with heart rate of 111, temperature 95.4 and otherwise normal vital signs.  Labs revealed VBG with pH 7.35 and HCO3 of 16 and BMP with sodium 133, CO2 of 17 glucose 310 and BUN 51 with a creatinine 1.27 and anion gap of 16.  LFTs showed albumin of 2.7 with total protein of 7.3 AST 54 and ALT 45.  Lactic acid was 5 and later 4.5 and troponin I 3 and later 4.  CBC showed significant  anemia with hemoglobin 6.6 and hematocrit 20.5 compared to 11.1 and 32.6 on 08/11/2021 and platelets of 70.  Blood group was A+ with negative antibody screen UA showed 150 glucose and was otherwise unremarkable.  Alcohol level was less than 10.  Blood cultures were drawn.  INR was 1.8 and PT 20.3.  Stool Hemoccult was positive.  EDP. EKG as reviewed by me : Sinus tachycardia with a rate of 103 with low voltage QRS. Imaging: Portable chest x-ray showed no acute cardiopulmonary disease.  It showed healing of anterior left sixth and eighth rib fractures.  The patient was given 40 mg of IV Protonix, 2 L bolus of IV normal saline, 500 mg IV thiamine and 5 units of IV NovoLog.  He will be admitted to a progressive unit bed for further evaluation and management. PAST MEDICAL HISTORY:   Past Medical History:  Diagnosis Date   Alcohol abuse    Alcohol withdrawal (HCC) 04/13/2018   Arthritis    Colon polyps    COPD (chronic obstructive pulmonary disease) (HCC)    Depression    Diabetes mellitus without complication (HCC)    Hyperlipidemia    Hypertension     PAST SURGICAL HISTORY:   Past Surgical History:  Procedure Laterality Date   ANTERIOR CRUCIATE LIGAMENT REPAIR Right 2016   COLONOSCOPY  WITH PROPOFOL N/A 05/18/2018   Procedure: COLONOSCOPY WITH BIOPSIES;  Surgeon: Midge Minium, MD;  Location: [facility];  Service: Endoscopy;  Laterality: N/A;   KNEE RECONSTRUCTION     MEDIAL COLLATERAL LIGAMENT AND LATERAL COLLATERAL LIGAMENT REPAIR, KNEE Right 2016   POLYPECTOMY N/A 05/18/2018   Procedure: POLYPECTOMY;  Surgeon: Midge Minium, MD;  Location: [facility];  Service: Endoscopy;  Laterality: N/A;    SOCIAL HISTORY:   Social History   Tobacco Use   Smoking status: Every Day    Packs/day: 1.5    Types: Cigarettes   Smokeless tobacco: Never  Substance Use Topics   Alcohol use: Not Currently    Comment: Hx of alcoholism    FAMILY HISTORY:   Family History  Problem  Relation Age of Onset   Depression Mother    Diabetes Mother    Early death Mother    Kidney disease Mother    HIV/AIDS Mother    Alcohol abuse Father    Diabetes Father    Early death Father    Leukemia Father    Depression Sister    Diabetes Sister    Drug abuse Sister    Early death Sister    Cancer Sister    Early death Paternal Grandfather    Heart attack Paternal Grandfather    Heart disease Paternal Grandfather     DRUG ALLERGIES:  No Known Allergies  REVIEW OF SYSTEMS:   ROS As per history of present illness. All pertinent systems were reviewed above. Constitutional, HEENT, cardiovascular, respiratory, GI, GU, musculoskeletal, neuro, psychiatric, endocrine, integumentary and hematologic systems were reviewed and are otherwise negative/unremarkable except for positive findings mentioned above in the HPI.   MEDICATIONS AT HOME:   Prior to Admission medications   Medication Sig Start Date End Date Taking? Authorizing Provider  albuterol (PROVENTIL HFA;VENTOLIN HFA) 108 (90 Base) MCG/ACT inhaler Inhale 1-2 puffs into the lungs every 6 (six) hours as needed for wheezing or shortness of breath. Patient not taking: Reported on 08/11/2021 05/10/18   Doreene Nest, NP  apixaban (ELIQUIS) 5 MG TABS tablet Take 2 tablet (10 mg) by mouth twice per day until 3/12, followed by 1 tablet (5 mg) by mouth twice daily. Patient not taking: Reported on 09/03/2022 08/04/21   [provider]  naproxen sodium (ALEVE) 220 MG tablet Take 220 mg by mouth. Patient not taking: Reported on 09/03/2022    [provider]      VITAL SIGNS:  Blood pressure 97/60, pulse (!) 115, temperature 97.8 F (36.6 C), temperature source Oral, resp. rate 18, weight 77.5 kg, SpO2 100 %.  PHYSICAL EXAMINATION:  Physical Exam  GENERAL:  57 y.o.-year-old Caucasian male patient lying in the bed with no acute distress.  EYES: Pupils equal, round, reactive to light and accommodation. No scleral  icterus.  Positive pallor.  Extraocular muscles intact.  HEENT: Head atraumatic, normocephalic. Oropharynx and nasopharynx clear.  NECK:  Supple, no jugular venous distention. No thyroid enlargement, no tenderness.  LUNGS: Normal breath sounds bilaterally, no wheezing, rales,rhonchi or crepitation. No use of accessory muscles of respiration.  CARDIOVASCULAR: Regular rate and rhythm, S1, S2 normal. No murmurs, rubs, or gallops.  ABDOMEN: Soft, nondistended, nontender. Bowel sounds present. No organomegaly or mass.  EXTREMITIES: No pedal edema, cyanosis, or clubbing.  NEUROLOGIC: Cranial nerves II through XII are intact. Muscle strength 5/5 in all extremities. Sensation intact. Gait not checked.  PSYCHIATRIC: The patient is alert and oriented x 3.  Normal affect  and good eye contact. SKIN: No obvious rash, lesion, or ulcer.   LABORATORY PANEL:   CBC Recent Labs  Lab 09/03/22 2039  WBC 6.1  HGB 6.6*  HCT 20.5*  PLT 70*   ------------------------------------------------------------------------------------------------------------------  Chemistries  Recent Labs  Lab 09/03/22 2039  NA 133*  K 4.5  CL 100  CO2 17*  GLUCOSE 310*  BUN 51*  CREATININE 1.27*  CALCIUM 9.1  MG 2.4  AST 54*  ALT 45*  ALKPHOS 37*  BILITOT 5.9*   ------------------------------------------------------------------------------------------------------------------  Cardiac Enzymes No results for input(s): "TROPONINI" in the last 168 hours. ------------------------------------------------------------------------------------------------------------------  RADIOLOGY:  DG Chest Portable 1 View  Result Date: 09/03/2022 CLINICAL DATA:  Hypotension and weakness. EXAM: PORTABLE CHEST 1 VIEW COMPARISON:  PA chest 04/13/2018 FINDINGS: The heart size and mediastinal contours are within normal limits. Both lungs are clear. The visualized skeletal structures are unremarkable. IMPRESSION: No active disease. Stable  chest apart from interval healing of prior acute anterior left sixth and eighth rib fractures. Electronically Signed   By: Almira Bar M.D.   On: 09/03/2022 20:47      IMPRESSION AND PLAN:  Assessment and Plan: * GI bleeding - The patient has subsequent acute blood loss anemia. - I suspect etiology could be related to alcoholic gastritis or possibly NSAIDs related gastritis - He will be admitted to a progressive unit bed. - He was typed and crossmatched and will be transfused 2 units of packed red blood cells. - We will follow posttransfusion H&H. - We will continue on IV PPI therapy with Protonix. - We will hold off Eliquis and NSAIDs. - GI consult will be obtained. - I notified Dr. Norma Fredrickson about the patient.  Hypotension - This is likely secondary to GI bleeding and volume depletion with recent anorexia. - We will continue hydration with IV normal saline.  DKA (diabetic ketoacidosis) - This is early and mild in the acidosis could be mainly related to alcoholic acidosis and lactic acidosis. - We will continue hydration with IV normal saline and place the patient on supplement coverage with NovoLog while monitoring BMPs.  AKI (acute kidney injury) - The likely prerenal due to volume depletion and dehydration. - He has a stated metabolic acidosis that could be related to AKI as well as alcohol related ketoacidosis. -Will follow BMP with hydration. - We will follow lactic acid level with hydration given lactic acidosis.  Alcohol abuse She no StandifordHypotension hypotension- She will be monitored for alcohol withdrawal. - We will place him on CIWA protocol. - She was counseled for cessation.  History of DVT (deep vein thrombosis) - Given his current coagulopathy and GI bleeding we will have to hold off Eliquis.       DVT prophylaxis: SCDs Advanced Care Planning:  Code Status: full code. Family Communication:  The plan of care was discussed in details with the patient  (and family). I answered all questions. The patient agreed to proceed with the above mentioned plan. Further management will depend upon hospital course. Disposition Plan: Back to previous home environment Consults called: GI consult. All the records are reviewed and case discussed with ED provider.  Status is: Inpatient   At the time of the admission, it appears that the appropriate admission status for this patient is inpatient.  This is judged to be reasonable and necessary in order to provide the required intensity of service to ensure the patient's safety given the presenting symptoms, physical exam findings and initial radiographic and laboratory data in  the context of comorbid conditions.  The patient requires inpatient status due to high intensity of service, high risk of further deterioration and high frequency of surveillance required.  I certify that at the time of admission, it is my clinical judgment that the patient will require inpatient hospital care extending more than 2 midnights.                            Dispo: The patient is from: Home              Anticipated d/c is to: Home              Patient currently is not medically stable to d/c.              Difficult to place patient: No  Hannah Beat M.D on 09/04/2022 at 12:49 AM  [facility]   From 7 PM-7 AM, contact night-coverage [email or web address]  CC: Primary care physician; Doreene Nest, NP

## 2022-09-04 ENCOUNTER — Inpatient Hospital Stay: Payer: Medicare Other

## 2022-09-04 DIAGNOSIS — E8729 Other acidosis: Secondary | ICD-10-CM

## 2022-09-04 DIAGNOSIS — R627 Adult failure to thrive: Secondary | ICD-10-CM | POA: Insufficient documentation

## 2022-09-04 DIAGNOSIS — E1110 Type 2 diabetes mellitus with ketoacidosis without coma: Secondary | ICD-10-CM | POA: Insufficient documentation

## 2022-09-04 DIAGNOSIS — F1010 Alcohol abuse, uncomplicated: Secondary | ICD-10-CM

## 2022-09-04 DIAGNOSIS — K2901 Acute gastritis with bleeding: Secondary | ICD-10-CM

## 2022-09-04 DIAGNOSIS — I959 Hypotension, unspecified: Secondary | ICD-10-CM

## 2022-09-04 DIAGNOSIS — D696 Thrombocytopenia, unspecified: Secondary | ICD-10-CM | POA: Insufficient documentation

## 2022-09-04 DIAGNOSIS — D62 Acute posthemorrhagic anemia: Secondary | ICD-10-CM

## 2022-09-04 DIAGNOSIS — Z86718 Personal history of other venous thrombosis and embolism: Secondary | ICD-10-CM

## 2022-09-04 DIAGNOSIS — N179 Acute kidney failure, unspecified: Secondary | ICD-10-CM

## 2022-09-04 HISTORY — DX: Acute posthemorrhagic anemia: D62

## 2022-09-04 LAB — BASIC METABOLIC PANEL
Anion gap: 10 (ref 5–15)
Anion gap: 7 (ref 5–15)
BUN: 39 mg/dL — ABNORMAL HIGH (ref 6–20)
BUN: 27 mg/dL — ABNORMAL HIGH (ref 6–20)
CO2: 18 mmol/L — ABNORMAL LOW (ref 22–32)
CO2: 21 mmol/L — ABNORMAL LOW (ref 22–32)
Calcium: 7.6 mg/dL — ABNORMAL LOW (ref 8.9–10.3)
Calcium: 7.8 mg/dL — ABNORMAL LOW (ref 8.9–10.3)
Chloride: 111 mmol/L (ref 98–111)
Chloride: 113 mmol/L — ABNORMAL HIGH (ref 98–111)
Creatinine, Ser: 0.95 mg/dL (ref 0.61–1.24)
Creatinine, Ser: 0.68 mg/dL (ref 0.61–1.24)
GFR, Estimated: >60 mL/min (ref >60)
GFR, Estimated: >60 mL/min (ref >60)
Glucose, Bld: 232 mg/dL — ABNORMAL HIGH (ref 70–99)
Glucose, Bld: 134 mg/dL — ABNORMAL HIGH (ref 70–99)
Potassium: 3.5 mmol/L (ref 3.5–5.1)
Potassium: 3.4 mmol/L — ABNORMAL LOW (ref 3.5–5.1)
Sodium: 139 mmol/L (ref 135–145)
Sodium: 141 mmol/L (ref 135–145)

## 2022-09-04 LAB — CBG MONITORING, ED
Glucose-Capillary: 253 mg/dL — ABNORMAL HIGH (ref 70–99)
Glucose-Capillary: 205 mg/dL — ABNORMAL HIGH (ref 70–99)
Glucose-Capillary: 175 mg/dL — ABNORMAL HIGH (ref 70–99)
Glucose-Capillary: 124 mg/dL — ABNORMAL HIGH (ref 70–99)

## 2022-09-04 LAB — CBC
HCT: 19.9 % — ABNORMAL LOW (ref 39.0–52.0)
Hemoglobin: 6.3 g/dL — ABNORMAL LOW (ref 13.0–17.0)
MCH: 30.1 pg (ref 26.0–34.0)
MCHC: 31.7 g/dL (ref 30.0–36.0)
MCV: 95.2 fL (ref 80.0–100.0)
Platelets: 51 10*3/uL — ABNORMAL LOW (ref 150–400)
RBC: 2.09 MIL/uL — ABNORMAL LOW (ref 4.22–5.81)
RDW: 16.6 % — ABNORMAL HIGH (ref 11.5–15.5)
WBC: 4.8 10*3/uL (ref 4.0–10.5)
nRBC: 4.2 % — ABNORMAL HIGH (ref 0.0–0.2)

## 2022-09-04 LAB — FERRITIN: Ferritin: 190 ng/mL (ref 24–336)

## 2022-09-04 LAB — BLOOD GAS, VENOUS
Acid-base deficit: 0.6 mmol/L (ref 0.0–2.0)
Bicarbonate: 24.9 mmol/L (ref 20.0–28.0)
O2 Saturation: 35.9 %
Patient temperature: 37
pCO2, Ven: 43 mmHg — ABNORMAL LOW (ref 44–60)
pH, Ven: 7.37 (ref 7.25–7.43)
pO2, Ven: <31 mmHg — CL (ref 32–45)

## 2022-09-04 LAB — BPAM RBC
Blood Product Expiration Date: 202404302359
Blood Product Expiration Date: 202405022359

## 2022-09-04 LAB — GLUCOSE, CAPILLARY
Glucose-Capillary: 137 mg/dL — ABNORMAL HIGH (ref 70–99)
Glucose-Capillary: 134 mg/dL — ABNORMAL HIGH (ref 70–99)

## 2022-09-04 LAB — LACTIC ACID, PLASMA
Lactic Acid, Venous: 3.5 mmol/L (ref 0.5–1.9)
Lactic Acid, Venous: 1.8 mmol/L (ref 0.5–1.9)

## 2022-09-04 LAB — TYPE AND SCREEN

## 2022-09-04 LAB — HEMOGLOBIN AND HEMATOCRIT, BLOOD
HCT: 26.4 % — ABNORMAL LOW (ref 39.0–52.0)
Hemoglobin: 8.8 g/dL — ABNORMAL LOW (ref 13.0–17.0)

## 2022-09-04 LAB — IRON AND TIBC
Iron: 148 ug/dL (ref 45–182)
Saturation Ratios: 96 % — ABNORMAL HIGH (ref 17.9–39.5)
TIBC: 154 ug/dL — ABNORMAL LOW (ref 250–450)
UIBC: 6 ug/dL

## 2022-09-04 LAB — VITAMIN B12: Vitamin B-12: 3554 pg/mL — ABNORMAL HIGH (ref 180–914)

## 2022-09-04 LAB — MAGNESIUM: Magnesium: 1.9 mg/dL (ref 1.7–2.4)

## 2022-09-04 LAB — HIV ANTIBODY (ROUTINE TESTING W REFLEX): HIV Screen 4th Generation wRfx: NONREACTIVE

## 2022-09-04 LAB — HEMOGLOBIN
Hemoglobin: 8.3 g/dL — ABNORMAL LOW (ref 13.0–17.0)
Hemoglobin: 8.1 g/dL — ABNORMAL LOW (ref 13.0–17.0)

## 2022-09-04 LAB — PREPARE RBC (CROSSMATCH)

## 2022-09-04 MED ORDER — INSULIN ASPART 100 UNIT/ML IJ SOLN
0.0000 [IU] | Freq: Three times a day (TID) | INTRAMUSCULAR | Status: DC
  Administered 2022-09-05 – 2022-09-06 (×5): 2 [IU] via SUBCUTANEOUS
  Administered 2022-09-07: 3 [IU] via SUBCUTANEOUS
  Administered 2022-09-07 – 2022-09-09 (×3): 2 [IU] via SUBCUTANEOUS
  Administered 2022-09-09: 1 [IU] via SUBCUTANEOUS
  Filled 2022-09-04 (×11): qty 1

## 2022-09-04 MED ORDER — SODIUM CHLORIDE 0.9 % IV BOLUS: Freq: Once | INTRAVENOUS | Status: AC

## 2022-09-04 MED ORDER — LORAZEPAM 1 MG PO TABS
1.0000 mg | ORAL_TABLET | ORAL | Status: AC | PRN
  Administered 2022-09-04: 1 mg via ORAL
  Filled 2022-09-04: qty 1

## 2022-09-04 MED ORDER — INSULIN GLARGINE-YFGN 100 UNIT/ML ~~LOC~~ SOLN
10.0000 [IU] | Freq: Every day | SUBCUTANEOUS | Status: DC
  Administered 2022-09-04 – 2022-09-07 (×4): 10 [IU] via SUBCUTANEOUS
  Filled 2022-09-04 (×5): qty 0.1

## 2022-09-04 MED ORDER — LORAZEPAM 2 MG/ML IJ SOLN: 1.0000 mg | INTRAMUSCULAR | Status: AC | PRN

## 2022-09-04 MED ORDER — THIAMINE HCL 100 MG/ML IJ SOLN
100.0000 mg | Freq: Every day | INTRAMUSCULAR | Status: DC
  Administered 2022-09-04: 100 mg via INTRAVENOUS
  Filled 2022-09-04: qty 2

## 2022-09-04 MED ORDER — ADULT MULTIVITAMIN W/MINERALS CH
1.0000 | ORAL_TABLET | Freq: Every day | ORAL | Status: DC
  Administered 2022-09-05 – 2022-09-09 (×5): 1 via ORAL
  Filled 2022-09-04 (×5): qty 1

## 2022-09-04 MED ORDER — THIAMINE MONONITRATE 100 MG PO TABS
100.0000 mg | ORAL_TABLET | Freq: Every day | ORAL | Status: DC
  Administered 2022-09-05 – 2022-09-09 (×5): 100 mg via ORAL
  Filled 2022-09-04 (×5): qty 1

## 2022-09-04 MED ORDER — POTASSIUM CHLORIDE CRYS ER 20 MEQ PO TBCR
40.0000 meq | EXTENDED_RELEASE_TABLET | Freq: Once | ORAL | Status: AC
  Administered 2022-09-05: 40 meq via ORAL
  Filled 2022-09-04: qty 2

## 2022-09-04 MED ORDER — FOLIC ACID 1 MG PO TABS
1.0000 mg | ORAL_TABLET | Freq: Every day | ORAL | Status: DC
  Administered 2022-09-05 – 2022-09-09 (×5): 1 mg via ORAL
  Filled 2022-09-04 (×5): qty 1

## 2022-09-04 MED ORDER — LACTATED RINGERS IV SOLN: INTRAVENOUS | Status: DC

## 2022-09-04 MED ORDER — SODIUM CHLORIDE 0.45 % IV SOLN
INTRAVENOUS | Status: DC
  Filled 2022-09-04 (×2): qty 75

## 2022-09-04 MED ORDER — SODIUM CHLORIDE 0.9 % IV BOLUS
250.0000 mL | Freq: Once | INTRAVENOUS | Status: AC
  Administered 2022-09-04: 250 mL via INTRAVENOUS

## 2022-09-04 MED ORDER — MIDODRINE HCL 5 MG PO TABS
10.0000 mg | ORAL_TABLET | Freq: Three times a day (TID) | ORAL | Status: DC
  Administered 2022-09-04 – 2022-09-06 (×5): 10 mg via ORAL
  Filled 2022-09-04 (×5): qty 2

## 2022-09-04 NOTE — ED Notes (Signed)
Admitting MD made aware of patient's most recent VS. Primary RN also aware of patient's VS at this time. Pt resting in hospital bed with lights dimmed for comfort, pt also requesting something to drink.

## 2022-09-04 NOTE — Progress Notes (Signed)
  Progress Note   Patient: Shaun Roberts YWV:[id number] DOB: [birth year] DOA: 09/03/2022     1 DOS: the patient was seen and examined on 09/04/2022   Brief hospital course: Shaun Roberts is a 57 y.o. Caucasian male with medical history significant for COPD, depression, DVT, type 2 diabetes mellitus, hypertension, dyslipidemia and alcohol abuse, who presented to the emergency room with acute onset of generalized weakness with failure to thrive.  Patient was also found to have dark brown stool which was guaiac positive. Upon arriving the hospital, patient was hypotensive with blood pressure 87/63, metabolic acidosis, with elevated anion gap, significant lactic acidosis.  Hemoglobin 6.0. Patient received IV fluid bolus, blood transfusion, and IV Protonix.  GI consult obtained.   Principal Problem:   GI bleeding Active Problems:   Hypotension   DKA, type 2, not at goal   AKI (acute kidney injury)   Alcohol abuse   History of DVT (deep vein thrombosis)   Alcoholic ketoacidosis   Acute blood loss anemia   Thrombocytopenia   Failure to thrive in adult   Assessment and Plan: * GI bleeding likely due to alcoholic gastritis. Acute blood loss anemia secondary to GI bleed. Hypotension secondary to acute blood loss anemia. Patient received IV fluids, blood transfusion, hemoglobin is better. Upper GI bleed most likely due to alcoholic gastritis, being seen by GI, continued on Protonix.  Iron level is adequate, check a B12 level. Patient is now hemodynamically stable, will transfer to regular medical floor. Right upper quadrant ultrasound pending to rule out liver cirrhosis.  DKA, type 2, not at goal AKI (acute kidney injury) Alcohol ketoacidosis. Patient anion gap has improved, still has no anion gap metabolic acidosis.  Will start sodium bicarb drip.  Follow BMP. Patient still has significant hyperglycemia.  Started long-acting insulin in addition to sliding scale insulin.  A1c pending. Patient  has started on soft diet per GI.  Alcohol abuse Continue CIWA protocol.  History of DVT (deep vein thrombosis) Eliquis on hold due to concern of GI bleed.       Subjective:  Patient feels sleepy, no abdominal pain or nausea vomiting.  Denies any short of breath.  Physical Exam: Vitals:   09/04/22 0730 09/04/22 0800 09/04/22 0830 09/04/22 0855  BP: (!) 84/69 100/61 101/66   Pulse: (!) 111  (!) 111   Resp: 18 17 17 16   Temp:    98 F (36.7 C)  TempSrc:      SpO2: 99%  100%   Weight:       General exam: Appears calm and comfortable  Respiratory system: Clear to auscultation. Respiratory effort normal. Cardiovascular system: S1 & S2 heard, RRR. No JVD, murmurs, rubs, gallops or clicks. No pedal edema. Gastrointestinal system: Abdomen is nondistended, soft and nontender. No organomegaly or masses felt. Normal bowel sounds heard. Central nervous system: Drowsy, oriented to place and person. No focal neurological deficits. Extremities: Symmetric 5 x 5 power. Skin: No rashes, lesions or ulcers Psychiatry: Flat affect.   Data Reviewed:  Reviewed chest x-ray and lab results.  Family Communication: unable to reach son  Disposition: Status is: Inpatient Remains inpatient appropriate because: Severity of disease, IV treatment.     Time spent: 50 minutes  Author: Marrion Coy, MD 09/04/2022 10:49 AM  For on call review [email or web address].

## 2022-09-04 NOTE — Assessment & Plan Note (Signed)
-   Given his current coagulopathy and GI bleeding we will have to hold off Eliquis.

## 2022-09-04 NOTE — Hospital Course (Signed)
Shaun Roberts is a 57 y.o. Caucasian male with medical history significant for COPD, depression, DVT, type 2 diabetes mellitus, hypertension, dyslipidemia and alcohol abuse, who presented to the emergency room with acute onset of generalized weakness with failure to thrive.  Patient was also found to have dark brown stool which was guaiac positive. Upon arriving the hospital, patient was hypotensive with blood pressure 87/63, metabolic acidosis, with elevated anion gap, significant lactic acidosis.  Hemoglobin 6.0. Patient received IV fluid bolus, blood transfusion, and IV Protonix.  GI consult obtained. EGD/colonoscopy scheduled 4/10.  Given unit of platelets for severe thrombocytopenia preop.  4/10.  Hemoglobin dipped down to 7.5 will watch again tomorrow morning.  EGD showing duodenal ulcer, gastric ulcer and portal hypertensive gastropathy.

## 2022-09-04 NOTE — Progress Notes (Signed)
Manuela Schwartz NP on call is aware of VS.  Will add recheck HGB and labs for tonight.  Pt awakens easily, but when asked if he knew where he was he stated "[place]".  Pt then stated he was in [place].  He is able to tell me he was at [facility], it is 2024 and that Jackquline Bosch is president.  He states he has had some vomiting at home "about every other day for a while" that is "sometimes bloody".  He does have dried brown drainage around his mouth.  Oral care provided.    09/04/22 2128  Vitals  Temp 98 F (36.7 C)  Temp Source Oral  BP 90/63  MAP (mmHg) 73  BP Location Right Arm  BP Method Automatic  Patient Position (if appropriate) Lying  Pulse Rate 100  Pulse Rate Source Dinamap  Resp 18  Level of Consciousness  Level of Consciousness Alert  MEWS COLOR  MEWS Score Color Green  Oxygen Therapy  SpO2 100 %  O2 Device Room Air  Pain Assessment  Pain Scale 0-10  Pain Score 0  POSS Scale (Pasero Opioid Sedation Scale)  POSS *See Group Information* 1-Acceptable,Awake and alert  PCA/Epidural/Spinal Assessment  Respiratory Pattern Regular;Unlabored;Symmetrical  Height and Weight  Height 5\' 11"  (1.803 m)  Weight 73.9 kg  Type of Scale Used Bed  Type of Weight Actual  BSA (Calculated - sq m) 1.92 sq meters  BMI (Calculated) 22.73  Weight in (lb) to have BMI = 25 178.9  MEWS Score  MEWS Temp 0  MEWS Systolic 1  MEWS Pulse 0  MEWS RR 0  MEWS LOC 0  MEWS Score 1   Hilton Sinclair BSN RN [facility] 09/04/2022, 9:44 PM

## 2022-09-04 NOTE — Assessment & Plan Note (Addendum)
She no StandifordHypotension hypotension- She will be monitored for alcohol withdrawal. - We will place him on CIWA protocol. - She was counseled for cessation.

## 2022-09-04 NOTE — Assessment & Plan Note (Addendum)
Improved with IV fluid hydration.

## 2022-09-04 NOTE — ED Notes (Signed)
US at bedside

## 2022-09-04 NOTE — Consult Note (Addendum)
GI Inpatient Consult Note  Reason for Consult: Blood loss anemia/GIB   Attending Requesting Consult: Dr. Valente David, MD  History of Present Illness: Shaun Roberts is a 57 y.o. male seen for evaluation of blood loss anemia concerning for GI bleeding at the request of admitting hospitalist - Dr. Valente David. Patient has a PMH of HTN, HLD, EtOH abuse, Hx of DVT, T2DM, depression, COPD, and hx of medication noncompliance. He presented to the [facility] ED via EMS yesterday evening for chief complaint of generalized weakness, poor PO intake, and reduced urinary/fecal output over the past 3-4 days. Upon presentation to the ED, he was borderline hypotensive, tachycardic, and hypothermic. Labs significant for anemia with hemoglobin 6.6, hematocrit 20.5 (hemoglobin 11.1 last month), platelets 70K, INR 1.8, serum creatinine 1.27, BUN 51, albumin 2.7, AST 54, ALT 45, EtOH <10, and VBG 7.35 with pH 7.35 and HCO3 16. Per ED physician, DRE showed dark brown stool which was guaiac positive. No diarrhea or constipation. No overt gastrointestinal blood loss. He was given 40 mg IV Protonix, 2L bolus of normal saline, 500 mg IV thiamine, and 5 units of IV Novolog. He was admitted to progressive unit bed for further evaluation and management. GI consulted by Dr. Arville Care for GI bleeding.   Patient seen and examined this morning resting comfortably. No acute events overnight. He is somnolent, but arouseable to voice and answers questions. He is able to tell me his name, date of birth, location, and current president. He denies any nausea, vomiting, chest pain, shortness of breath, abdominal pain, hematochezia, or melena. He reports he drinks beer daily up to 74 ounces daily. Last drink was just hours before arrival to the ED. He has never established care with gastroenterology. No prior history of cirrhosis or chronic live disease. There is no recent liver/abdominal imaging. He did have screening colonoscopy 04/2018 performed by Dr.  Servando Snare which removed two centimeter polyps with one tubular adenoma found. He was admitted 07/2021 for symptoms of petechiae, exertional dyspnea, right lower extremity swelling, diarrhea, and potential scurvy. During this hospitalization he was diagnosed right popliteal DVT and started on Eliquis. It is unclear how long he took this but is no longer taking Eliquis. A year ago he was taking Aleve 1-2 times per day for pains but reports he is no longer taking this. He reports he lives with his sister.    Summary of GI Procedures:  CSY 05/18/2018 - one 3 mm TA removed from transverse colon, one 4 mm hyperplastic polyp removed from sigmoid colon, otherwise normal examined colon  Past Medical History:  Past Medical History:  Diagnosis Date   Alcohol abuse    Alcohol withdrawal (HCC) 04/13/2018   Arthritis    Colon polyps    COPD (chronic obstructive pulmonary disease) (HCC)    Depression    Diabetes mellitus without complication (HCC)    Hyperlipidemia    Hypertension     Problem List: Patient Active Problem List   Diagnosis Date Noted   Alcohol abuse 09/04/2022   Hypotension 09/04/2022   AKI (acute kidney injury) 09/04/2022   DKA (diabetic ketoacidosis) 09/04/2022   History of DVT (deep vein thrombosis) 09/04/2022   Alcoholic ketoacidosis 09/04/2022   Acute blood loss anemia 09/04/2022   Thrombocytopenia 09/04/2022   GI bleeding 09/03/2022   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   Vitamin D deficiency 08/11/2021   Acute deep vein thrombosis (DVT) of right popliteal vein 08/11/2021   Hyponatremia 08/11/2021   Moderate episode of  recurrent major depressive disorder 12/31/2019   Insomnia 06/05/2018   Special screening for malignant neoplasms, colon    Benign neoplasm of transverse colon    Polyp of sigmoid colon    Preventative health care 05/10/2018   Tobacco abuse 05/10/2018   Trigger finger 05/04/2018   Alcohol dependence in early full remission 05/04/2018   Rib fractures 04/13/2018    COPD (chronic obstructive pulmonary disease) 04/13/2018   Controlled type 2 diabetes mellitus with hyperglycemia 04/13/2018    Past Surgical History: Past Surgical History:  Procedure Laterality Date   ANTERIOR CRUCIATE LIGAMENT REPAIR Right 2016   COLONOSCOPY WITH PROPOFOL N/A 05/18/2018   Procedure: COLONOSCOPY WITH BIOPSIES;  Surgeon: Midge MiniumWohl, Darren, MD;  [facility];  Service: Endoscopy;  Laterality: N/A;   KNEE RECONSTRUCTION     MEDIAL COLLATERAL LIGAMENT AND LATERAL COLLATERAL LIGAMENT REPAIR, KNEE Right 2016   POLYPECTOMY N/A 05/18/2018   Procedure: POLYPECTOMY;  Surgeon: Midge MiniumWohl, Darren, MD;  [facility];  Service: Endoscopy;  Laterality: N/A;    Allergies: No Known Allergies  Home Medications: (Not in a hospital admission)  Home medication reconciliation was completed with the patient.   Scheduled Inpatient Medications:    folic acid  1 mg Oral Daily   insulin aspart  0-20 Units Subcutaneous Q4H   midodrine  10 mg Oral TID WC   multivitamin with minerals  1 tablet Oral Daily   pantoprazole (PROTONIX) IV  40 mg Intravenous Q12H   thiamine  100 mg Oral Daily   Or   thiamine  100 mg Intravenous Daily    Continuous Inpatient Infusions:    sodium bicarbonate 75 mEq in sodium chloride 0.45 % 1,075 mL infusion      PRN Inpatient Medications:  acetaminophen **OR** acetaminophen, LORazepam **OR** LORazepam, ondansetron **OR** ondansetron (ZOFRAN) IV, traZODone  Family History: family history includes Alcohol abuse in his father; Cancer in his sister; Depression in his mother and sister; Diabetes in his father, mother, and sister; Drug abuse in his sister; Early death in his father, mother, paternal grandfather, and sister; HIV/AIDS in his mother; Heart attack in his paternal grandfather; Heart disease in his paternal grandfather; Kidney disease in his mother; Leukemia in his father.  The patient's family history is negative for inflammatory  bowel disorders, GI malignancy, or solid organ transplantation.  Social History:   reports that he has been smoking. He has been smoking an average of 1.5 packs per day. He has never used smokeless tobacco. He reports that he does not currently use alcohol. The patient denies ETOH, tobacco, or drug use.   Review of Systems: Constitutional: Weight is stable.  Eyes: No changes in vision. ENT: No oral lesions, sore throat.  GI: see HPI.  Heme/Lymph: + easy bruising  CV: No chest pain.  GU: No hematuria.  Integumentary: No rashes.  Neuro: No headaches.  Psych: No depression/anxiety.  Endocrine: No heat/cold intolerance.  Allergic/Immunologic: No urticaria.  Resp: No cough, SOB.  Musculoskeletal: No joint swelling.    Physical Examination: BP (!) 84/69   Pulse (!) 111   Temp (!) 97.4 F (36.3 C) (Oral)   Resp 18   Wt 77.5 kg   SpO2 99%   BMI 24.17 kg/m  Gen: NAD, alert and oriented x 4 HEENT: PEERLA, EOMI, +scleral icterus  Neck: supple, no JVD or thyromegaly Chest: CTA bilaterally, no wheezes, crackles, or other adventitious sounds CV: RRR, no m/g/c/r Abd: soft, NT, ND, +BS in all four quadrants; no HSM,  guarding, ridigity, or rebound tenderness Ext: no edema, well perfused with 2+ pulses, Skin: no rash or lesions noted Lymph: no LAD  Data: Lab Results  Component Value Date   WBC 4.8 09/04/2022   HGB 6.3 (L) 09/04/2022   HCT 19.9 (L) 09/04/2022   MCV 95.2 09/04/2022   PLT 51 (L) 09/04/2022   Recent Labs  Lab 09/03/22 2039 09/04/22 0425  HGB 6.6* 6.3*   Lab Results  Component Value Date   NA 139 09/04/2022   K 3.5 09/04/2022   CL 111 09/04/2022   CO2 18 (L) 09/04/2022   BUN 39 (H) 09/04/2022   CREATININE 0.95 09/04/2022   Lab Results  Component Value Date   ALT 45 (H) 09/03/2022   AST 54 (H) 09/03/2022   ALKPHOS 37 (L) 09/03/2022   BILITOT 5.9 (H) 09/03/2022   Recent Labs  Lab 09/03/22 2039  INR 1.8*    Assessment/Plan:  57 y/o Caucasian male  with a PMH of HTN, HLD, EtOH abuse, Hx of DVT, T2DM, depression, COPD, and hx of medication noncompliance [facility] yesterday evening for chief complaint of generalized weakness, poor PO intake, and reduced urinary/fecal output. He was found to have significant anemia with drop in hemoglobin from 11.1 one month ago to 6.6. GI consulted for further evaluation and management.   Normocytic anemia - hemoglobin 6.3 with normal MCV and no evidence of iron-deficiency. Suspect anemia is multifactorial in setting of severe nutritional deficiencies and bone marrow suppression from daily EtOH abuse. No concerns for acute GI bleeding. No overt hematochezia or melena.   Heme positive stool - positive colon cancer screening test and has no bearing on work-up for anemia or bleeding.   AKI - likely prerenal 2/2 hypovolemia and dehydration  Alcoholic ketoacidosis   Daily EtOH abuse  Hx of DVT - no longer on chronic anticoagulation   Recommendations:  - Maintain 2 large bore IVs for access - Continue to monitor serial H&H. Transfuse for Hgb <7.0. Transfuse 1 unit this morning per primary team.  - Continue PPI for gastric protection - Continue supportive care per primary team with IV fluid hydration, pain control, and antiemetics prn - Continue management of medical comorbidities per primary team - He is at high-risk for withdrawal. CIWA protocol.  - Continue to monitor daily LFTs - Continue thiamine administration  - Will obtain US Abdomen complete to assess liver morphology and bile ducts given daily EtOH. He is at risk of developing hepatic decompensation.  - No evidence of overt GI bleeding or significant hemodynamic changes - EGD +/- colonoscopy when clinically feasible, potentially as outpatient  - ADAT - soft diet for now  - GI following along with you for now   Thank you for the consult. Please call with questions or concerns.  Gilda CreaseGranville M Jewell Ryans, PA-C [facility]  Gastroenterology [phone]

## 2022-09-04 NOTE — Assessment & Plan Note (Signed)
-   This is early and mild in the acidosis could be mainly related to alcoholic acidosis and lactic acidosis. - We will continue hydration with IV normal saline and place the patient on supplement coverage with NovoLog while monitoring BMPs.

## 2022-09-04 NOTE — Assessment & Plan Note (Signed)
-   This is likely secondary to GI bleeding and volume depletion with recent anorexia. - We will continue hydration with IV normal saline.

## 2022-09-05 DIAGNOSIS — K2901 Acute gastritis with bleeding: Secondary | ICD-10-CM | POA: Diagnosis not present

## 2022-09-05 DIAGNOSIS — E43 Unspecified severe protein-calorie malnutrition: Secondary | ICD-10-CM | POA: Insufficient documentation

## 2022-09-05 DIAGNOSIS — R627 Adult failure to thrive: Secondary | ICD-10-CM | POA: Diagnosis not present

## 2022-09-05 DIAGNOSIS — E8729 Other acidosis: Secondary | ICD-10-CM | POA: Diagnosis not present

## 2022-09-05 DIAGNOSIS — L8990 Pressure ulcer of unspecified site, unspecified stage: Secondary | ICD-10-CM | POA: Insufficient documentation

## 2022-09-05 LAB — BPAM RBC
ISSUE DATE / TIME: 202404052353
ISSUE DATE / TIME: 202404060328
Unit Type and Rh: 6200
Unit Type and Rh: 6200

## 2022-09-05 LAB — BASIC METABOLIC PANEL
Anion gap: 7 (ref 5–15)
BUN: 26 mg/dL — ABNORMAL HIGH (ref 6–20)
CO2: 23 mmol/L (ref 22–32)
Calcium: 7.8 mg/dL — ABNORMAL LOW (ref 8.9–10.3)
Chloride: 112 mmol/L — ABNORMAL HIGH (ref 98–111)
Creatinine, Ser: 0.77 mg/dL (ref 0.61–1.24)
GFR, Estimated: >60 mL/min (ref >60)
Glucose, Bld: 189 mg/dL — ABNORMAL HIGH (ref 70–99)
Potassium: 3.6 mmol/L (ref 3.5–5.1)
Sodium: 142 mmol/L (ref 135–145)

## 2022-09-05 LAB — GLUCOSE, CAPILLARY
Glucose-Capillary: 178 mg/dL — ABNORMAL HIGH (ref 70–99)
Glucose-Capillary: 157 mg/dL — ABNORMAL HIGH (ref 70–99)
Glucose-Capillary: 171 mg/dL — ABNORMAL HIGH (ref 70–99)
Glucose-Capillary: 149 mg/dL — ABNORMAL HIGH (ref 70–99)

## 2022-09-05 LAB — TYPE AND SCREEN
ABO/RH(D): A POS
Antibody Screen: NEGATIVE
Unit division: 0
Unit division: 0

## 2022-09-05 LAB — AMMONIA: Ammonia: 23 umol/L (ref 9–35)

## 2022-09-05 LAB — CBC
HCT: 23.5 % — ABNORMAL LOW (ref 39.0–52.0)
Hemoglobin: 8 g/dL — ABNORMAL LOW (ref 13.0–17.0)
MCH: 31.3 pg (ref 26.0–34.0)
MCHC: 34 g/dL (ref 30.0–36.0)
MCV: 91.8 fL (ref 80.0–100.0)
Platelets: 41 10*3/uL — ABNORMAL LOW (ref 150–400)
RBC: 2.56 MIL/uL — ABNORMAL LOW (ref 4.22–5.81)
RDW: 16.6 % — ABNORMAL HIGH (ref 11.5–15.5)
WBC: 4.2 10*3/uL (ref 4.0–10.5)
nRBC: 4.5 % — ABNORMAL HIGH (ref 0.0–0.2)

## 2022-09-05 LAB — CORTISOL: Cortisol, Plasma: 11.3 ug/dL

## 2022-09-05 LAB — MAGNESIUM: Magnesium: 1.9 mg/dL (ref 1.7–2.4)

## 2022-09-05 LAB — PHOSPHORUS: Phosphorus: 2.4 mg/dL — ABNORMAL LOW (ref 2.5–4.6)

## 2022-09-05 LAB — CULTURE, BLOOD (ROUTINE X 2): Culture: NO GROWTH

## 2022-09-05 MED ORDER — LACTULOSE 10 GM/15ML PO SOLN
20.0000 g | Freq: Once | ORAL | Status: DC
  Filled 2022-09-05: qty 30

## 2022-09-05 MED ORDER — POTASSIUM & SODIUM PHOSPHATES 280-160-250 MG PO PACK
1.0000 | PACK | Freq: Three times a day (TID) | ORAL | Status: AC
  Administered 2022-09-05 (×3): 1 via ORAL
  Filled 2022-09-05 (×3): qty 1

## 2022-09-05 MED ORDER — GLUCERNA SHAKE PO LIQD
237.0000 mL | Freq: Two times a day (BID) | ORAL | Status: DC
  Administered 2022-09-05 – 2022-09-09 (×4): 237 mL via ORAL

## 2022-09-05 NOTE — Progress Notes (Signed)
[facility] Inpatient Progress Note    Subjective: Patient seen for follow up anemia in the setting of malnourishment, scurvy, heme positive stool. Patient is somnolent, but not agitated, and answers questions appropriately. Poor hygiene and disheveled personal appearance noted. Denies pain or bleeding episodes now or recently.  Objective: Vital signs in last 24 hours: Temp:  [96.1 F (35.6 C)-98.6 F (37 C)] 97.6 F (36.4 C) (04/07 0830) Pulse Rate:  [84-103] 100 (04/07 0830) Resp:  [16-20] 16 (04/07 0431) BP: (88-156)/(56-126) 96/60 (04/07 0830) SpO2:  [92 %-100 %] 100 % (04/07 0830) Weight:  [73.9 kg] 73.9 kg (04/06 2128) Blood pressure 96/60, pulse 100, temperature 97.6 F (36.4 C), temperature source Oral, resp. rate 16, height 5\' 11"  (1.803 m), weight 73.9 kg, SpO2 100 %.    Intake/Output from previous day: 04/06 0701 - 04/07 0700 In: 2664.8 [P.O.:240; I.V.:1864.8; Blood:310; IV Piggyback:250] Out: 400 [Urine:400]  Intake/Output this shift: Total I/O In: -  Out: 200 [Urine:200]   Gen: NAD. Appears comfortable.  HEENT: [place]/AT. PERRLA. Normal external ear exam.  Chest: CTA, no wheezes.  CV: RR nl S1, S2. No gallops.  Abd: soft, nt, nd. BS+  Ext: no edema. Pulses 2+  Neuro:Appears weak, sedated. Judgement appears normal. Otherwise Nonfocal.   Lab Results: Results for orders placed or performed during the hospital encounter of 09/03/22 (from the past 24 hour(s))  CBG monitoring, ED     Status: Abnormal   Collection Time: 09/04/22  1:08 PM  Result Value Ref Range   Glucose-Capillary 124 (H) 70 - 99 mg/dL  Hemoglobin     Status: Abnormal   Collection Time: 09/04/22  4:10 PM  Result Value Ref Range   Hemoglobin 8.1 (L) 13.0 - 17.0 g/dL  Glucose, capillary     Status: Abnormal   Collection Time: 09/04/22  6:34 PM  Result Value Ref Range   Glucose-Capillary 137 (H) 70 - 99 mg/dL  Glucose, capillary     Status: Abnormal   Collection Time:  09/04/22  9:13 PM  Result Value Ref Range   Glucose-Capillary 134 (H) 70 - 99 mg/dL   Comment 1 Notify RN   Hemoglobin and hematocrit, blood     Status: Abnormal   Collection Time: 09/04/22 10:00 PM  Result Value Ref Range   Hemoglobin 8.8 (L) 13.0 - 17.0 g/dL   HCT 11.926.4 (L) 14.739.0 - 82.952.0 %  Blood gas, venous     Status: Abnormal   Collection Time: 09/04/22 10:00 PM  Result Value Ref Range   pH, Ven 7.37 7.25 - 7.43   pCO2, Ven 43 (L) 44 - 60 mmHg   pO2, Ven <31 (LL) 32 - 45 mmHg   Bicarbonate 24.9 20.0 - 28.0 mmol/L   Acid-base deficit 0.6 0.0 - 2.0 mmol/L   O2 Saturation 35.9 %   Patient temperature 37.0    Collection site VENOUS   Basic metabolic panel     Status: Abnormal   Collection Time: 09/04/22 10:00 PM  Result Value Ref Range   Sodium 141 135 - 145 mmol/L   Potassium 3.4 (L) 3.5 - 5.1 mmol/L   Chloride 113 (H) 98 - 111 mmol/L   CO2 21 (L) 22 - 32 mmol/L   Glucose, Bld 134 (H) 70 - 99 mg/dL   BUN 27 (H) 6 - 20 mg/dL   Creatinine, Ser 5.620.68 0.61 - 1.24 mg/dL   Calcium 7.8 (L) 8.9 - 10.3 mg/dL   GFR, Estimated >13>60 >08>60 mL/min   Anion  gap 7 5 - 15  Magnesium     Status: None   Collection Time: 09/04/22 10:00 PM  Result Value Ref Range   Magnesium 1.9 1.7 - 2.4 mg/dL  CBC     Status: Abnormal   Collection Time: 09/05/22  4:58 AM  Result Value Ref Range   WBC 4.2 4.0 - 10.5 K/uL   RBC 2.56 (L) 4.22 - 5.81 MIL/uL   Hemoglobin 8.0 (L) 13.0 - 17.0 g/dL   HCT 15.7 (L) 26.2 - 03.5 %   MCV 91.8 80.0 - 100.0 fL   MCH 31.3 26.0 - 34.0 pg   MCHC 34.0 30.0 - 36.0 g/dL   RDW 59.7 (H) 41.6 - 38.4 %   Platelets 41 (L) 150 - 400 K/uL   nRBC 4.5 (H) 0.0 - 0.2 %  Basic metabolic panel     Status: Abnormal   Collection Time: 09/05/22  4:58 AM  Result Value Ref Range   Sodium 142 135 - 145 mmol/L   Potassium 3.6 3.5 - 5.1 mmol/L   Chloride 112 (H) 98 - 111 mmol/L   CO2 23 22 - 32 mmol/L   Glucose, Bld 189 (H) 70 - 99 mg/dL   BUN 26 (H) 6 - 20 mg/dL   Creatinine, Ser 5.36 0.61  - 1.24 mg/dL   Calcium 7.8 (L) 8.9 - 10.3 mg/dL   GFR, Estimated >46 >80 mL/min   Anion gap 7 5 - 15  Magnesium     Status: None   Collection Time: 09/05/22  4:58 AM  Result Value Ref Range   Magnesium 1.9 1.7 - 2.4 mg/dL  Phosphorus     Status: Abnormal   Collection Time: 09/05/22  4:58 AM  Result Value Ref Range   Phosphorus 2.4 (L) 2.5 - 4.6 mg/dL  Ammonia     Status: None   Collection Time: 09/05/22  4:58 AM  Result Value Ref Range   Ammonia 23 9 - 35 umol/L  Glucose, capillary     Status: Abnormal   Collection Time: 09/05/22  7:43 AM  Result Value Ref Range   Glucose-Capillary 178 (H) 70 - 99 mg/dL   Comment 1 Notify RN    Comment 2 Document in Chart      Recent Labs    09/03/22 2039 09/04/22 0425 09/04/22 0816 09/04/22 1610 09/04/22 2200 09/05/22 0458  WBC 6.1 4.8  --   --   --  4.2  HGB 6.6* 6.3*   < > 8.1* 8.8* 8.0*  HCT 20.5* 19.9*  --   --  26.4* 23.5*  PLT 70* 51*  --   --   --  41*   < > = values in this interval not displayed.   BMET Recent Labs    09/04/22 0425 09/04/22 2200 09/05/22 0458  NA 139 141 142  K 3.5 3.4* 3.6  CL 111 113* 112*  CO2 18* 21* 23  GLUCOSE 232* 134* 189*  BUN 39* 27* 26*  CREATININE 0.95 0.68 0.77  CALCIUM 7.6* 7.8* 7.8*   LFT Recent Labs    09/03/22 2039  PROT 7.3  ALBUMIN 2.7*  AST 54*  ALT 45*  ALKPHOS 37*  BILITOT 5.9*   PT/INR Recent Labs    09/03/22 2039  LABPROT 20.3*  INR 1.8*   Hepatitis Panel No results for input(s): "HEPBSAG", "HCVAB", "HEPAIGM", "HEPBIGM" in the last 72 hours. C-Diff No results for input(s): "CDIFFTOX" in the last 72 hours. No results for input(s): "CDIFFPCR" in the last  72 hours.   Studies/Results: US Abdomen Complete  Result Date: 09/04/2022 CLINICAL DATA:  221910 Elevated LFTs 221910 141669, ETOH abuse 141669 EXAM: ABDOMEN ULTRASOUND COMPLETE COMPARISON:  None Available. FINDINGS: Gallbladder: Fine sludge within the gallbladder. No gallstones or wall thickening  visualized. No sonographic Murphy sign noted by sonographer. Common bile duct: Diameter: 5 mm. Liver: No focal lesion identified. Diffusely increased hepatic parenchymal echogenicity. Portal vein is patent on color Doppler imaging with normal direction of blood flow towards the liver. IVC: No abnormality visualized. Pancreas: Visualized portion unremarkable. Spleen: Spleen measures 12.7 cm in length.  No focal splenic lesion. Right Kidney: Length: 12.4 cm. Echogenicity within normal limits. No mass or hydronephrosis visualized. Left Kidney: Length: 13.8 cm. Echogenicity within normal limits. No mass or hydronephrosis visualized. Abdominal aorta: Not well seen, however no aneurysm visualized. Other findings: None. IMPRESSION: 1. The echogenicity of the liver is increased. This is a nonspecific finding but is most commonly seen with fatty infiltration of the liver. There are no obvious focal liver lesions. 2. Gallbladder sludge.  No sonographic evidence of cholecystitis. 3. Borderline splenomegaly. Electronically Signed   By: Duanne Guess D.O.   On: 09/04/2022 12:41   DG Chest Portable 1 View  Result Date: 09/03/2022 CLINICAL DATA:  Hypotension and weakness. EXAM: PORTABLE CHEST 1 VIEW COMPARISON:  PA chest 04/13/2018 FINDINGS: The heart size and mediastinal contours are within normal limits. Both lungs are clear. The visualized skeletal structures are unremarkable. IMPRESSION: No active disease. Stable chest apart from interval healing of prior acute anterior left sixth and eighth rib fractures. Electronically Signed   By: Almira Bar M.D.   On: 09/03/2022 20:47    Scheduled Inpatient Medications:    folic acid  1 mg Oral Daily   insulin aspart  0-9 Units Subcutaneous TID WC   insulin glargine-yfgn  10 Units Subcutaneous QHS   lactulose  20 g Oral Once   midodrine  10 mg Oral TID WC   multivitamin with minerals  1 tablet Oral Daily   pantoprazole (PROTONIX) IV  40 mg Intravenous Q12H   potassium  & sodium phosphates  1 packet Oral TID WC & HS   thiamine  100 mg Oral Daily   Or   thiamine  100 mg Intravenous Daily    Continuous Inpatient Infusions:    lactated ringers 100 mL/hr at 09/04/22 2357    PRN Inpatient Medications:  acetaminophen **OR** acetaminophen, LORazepam **OR** LORazepam, ondansetron **OR** ondansetron (ZOFRAN) IV, traZODone  Miscellaneous: N/A  Assessment:  Anemia - multifactorial, likely from combination of poor nutrition, bone marrow suppression from alcohol, possible occult GI blood loss. Alcohol abuse. Failure to thrive. Hypovolemia, hypotension - on IVF's. BP 96/60. Abnormal RUQ ultrasound - Fatty liver, gb sludge, mild splenomegaly. Mild transaminasemia (AST 54, ALT-45 on 09/03/22).  Plan:  Continue diet as tolerated. EGD and colonoscopy will be planned. Clear liquid diet beginning tomorrow in preparation for EGD and colonoscopy on Tuesday 4/9. Continue monitoring labs and patient Mental status.  Sharilynn Cassity K. Norma Fredrickson, M.D. 09/05/2022, 11:06 AM

## 2022-09-05 NOTE — Progress Notes (Signed)
  Progress Note   Patient: Shaun Roberts KRC:[id number] DOB: 05-31-1966 DOA: 09/03/2022     2 DOS: the patient was seen and examined on 09/05/2022   Brief hospital course: Wendel Decandia is a 57 y.o. Caucasian male with medical history significant for COPD, depression, DVT, type 2 diabetes mellitus, hypertension, dyslipidemia and alcohol abuse, who presented to the emergency room with acute onset of generalized weakness with failure to thrive.  Patient was also found to have dark brown stool which was guaiac positive. Upon arriving the hospital, patient was hypotensive with blood pressure 87/63, metabolic acidosis, with elevated anion gap, significant lactic acidosis.  Hemoglobin 6.0. Patient received IV fluid bolus, blood transfusion, and IV Protonix.  GI consult obtained.   Principal Problem:   GI bleeding Active Problems:   Hypotension   DKA, type 2, not at goal   AKI (acute kidney injury)   Alcohol abuse   History of DVT (deep vein thrombosis)   Alcoholic ketoacidosis   Acute blood loss anemia   Thrombocytopenia   Failure to thrive in adult   Hypophosphatemia   Pressure injury of skin   Protein-calorie malnutrition, severe   Assessment and Plan: * GI bleeding likely due to alcoholic gastritis. Acute blood loss anemia secondary to GI bleed. Hypotension secondary to acute blood loss anemia. Patient received IV fluids, blood transfusion, hemoglobin is better. Upper GI bleed most likely due to alcoholic gastritis, being seen by GI, continued on Protonix.  Iron level is adequate,  B12 elevated Right upper ultrasound showed steatohepatitis. Patient hemoglobin appears to be stabilizing.  Continue to follow.   DKA, type 2, not at goal AKI (acute kidney injury) Alcohol ketoacidosis. Hypophosphatemia. Condition all improved.  Patient has minimally reduced the phosphorus level, repleted orally.  Severe protein calorie malnutrition secondary to alcohol drinking. Patient had a  significant muscle atrophy, start Glucerna.   Alcohol abuse Continue CIWA protocol.   History of DVT (deep vein thrombosis) Eliquis on hold due to concern of GI bleed.   Generalized weakness. PT/OT.     Subjective:  Patient is constipated, has not had a bowel movement for the last 2 days, given lactulose. No shortness of breath or cough.  Physical Exam: Vitals:   09/04/22 2128 09/05/22 0005 09/05/22 0431 09/05/22 0830  BP: 90/63 (!) 88/60 (!) 88/56 96/60  Pulse: 100 98 100 100  Resp: 18 18 16    Temp: 98 F (36.7 C) 98.6 F (37 C) 98.6 F (37 C) 97.6 F (36.4 C)  TempSrc: Oral Oral Axillary Oral  SpO2: 100% 100% 100% 100%  Weight: 73.9 kg     Height: 5\' 11"  (1.803 m)      General exam: Appears calm and comfortable  Respiratory system: Clear to auscultation. Respiratory effort normal. Cardiovascular system: S1 & S2 heard, RRR. No JVD, murmurs, rubs, gallops or clicks. No pedal edema. Gastrointestinal system: Abdomen is nondistended, soft and nontender. No organomegaly or masses felt. Normal bowel sounds heard. Central nervous system: Alert and oriented x2. No focal neurological deficits. Extremities: Symmetric 5 x 5 power. Skin: No rashes, lesions or ulcers Psychiatry:  Mood & affect appropriate.    Data Reviewed:  Lab results reviewed.  Family Communication: Son and ex-wife updated at bedside.  Disposition: Status is: Inpatient Remains inpatient appropriate because: Severity of disease     Time spent: 35 minutes  Author: Marrion Coy, MD 09/05/2022 11:01 AM  For on call review [email or web address].

## 2022-09-05 NOTE — Evaluation (Signed)
Physical Therapy Evaluation Patient Details Name: Shaun Roberts MRN: [id number] DOB: 25-Mar-1966 Today's Date: 09/05/2022  History of Present Illness  Pt is a 57 y/o M admitted on 09/03/22 after presenting to the ED with c/o acute onset of generalized weakness & failure to thrive. Pt have to have dark brown stool, which was guaiac positive. Pt was found to be hypotensive on arrival & Hgb of 6. Pt is being treated for GI bleeding likely 2/2 alcoholic gastritis, ABLA 2/2 GI bleed, & hypotension 2/2 ABLA. PMH: COPD, depression, DVT, DM2, HTN, dyslipidemia, alcohol abuse  Clinical Impression  Pt seen for PT evaluation with pt asleep but easily awakened. Pt not fully oriented to time, situation or place & demonstrates decreased initiation, overall awareness, & safety awareness. Pt utilizes bed rails & extra time to complete supine<>sit. Attempted to assist pt to [facility] but pt unable to clear buttocks from EOB despite max assist x 3 attempts. Pt assisted back to bed & rolled L<>R to allow PT to perform peri hygiene 2/2 incontinent BM & pt assisted on bed pan & left in care of NT. Recommend ongoing PT services to address current functional deficits.        Recommendations for follow up therapy are one component of a multi-disciplinary discharge planning process, led by the attending physician.  Recommendations may be updated based on patient status, additional functional criteria and insurance authorization.  Follow Up Recommendations Can patient physically be transported by private vehicle: No     Assistance Recommended at Discharge Frequent or constant Supervision/Assistance  Patient can return home with the following  Two people to help with walking and/or transfers;Two people to help with bathing/dressing/bathroom;Direct supervision/assist for medications management;Help with stairs or ramp for entrance;Assist for transportation;Assistance with cooking/housework;Direct supervision/assist for financial  management;Assistance with feeding    Equipment Recommendations None recommended by PT (TBD in next venue)  Recommendations for Other Services       Functional Status Assessment Patient has had a recent decline in their functional status and demonstrates the ability to make significant improvements in function in a reasonable and predictable amount of time.     Precautions / Restrictions Precautions Precautions: Fall Restrictions Weight Bearing Restrictions: No      Mobility  Bed Mobility Overal bed mobility: Needs Assistance Bed Mobility: Rolling, Supine to Sit, Sit to Supine Rolling: Min assist, Min guard   Supine to sit: Min guard, HOB elevated Sit to supine: Min guard, HOB elevated   General bed mobility comments: extra time, max cuing for initiation & participation    Transfers                        Ambulation/Gait                  Stairs            Wheelchair Mobility    Modified Rankin (Stroke Patients Only)       Balance Overall balance assessment: Needs assistance Sitting-balance support: Feet supported, Bilateral upper extremity supported Sitting balance-Leahy Scale: Fair Sitting balance - Comments: close supervision static sitting                                     Pertinent Vitals/Pain Pain Assessment Pain Assessment: Faces Faces Pain Scale: No hurt    Home Living Family/patient expects to be discharged to:: Private residence Living Arrangements: Alone  Type of Home: House Home Access: Stairs to enter Entrance Stairs-Rails:  (1 rail, pt did not report which side) Entrance Stairs-Number of Steps: 2   Home Layout: One level        Prior Function               Mobility Comments: Pt reports he was independent without AD prior to admission.       Hand Dominance        Extremity/Trunk Assessment   Upper Extremity Assessment Upper Extremity Assessment: Generalized weakness (pt appears  to have feces under fingernails)    Lower Extremity Assessment Lower Extremity Assessment: Generalized weakness       Communication      Cognition Arousal/Alertness: Awake/alert Behavior During Therapy: WFL for tasks assessed/performed Overall Cognitive Status: No family/caregiver present to determine baseline cognitive functioning Area of Impairment: Orientation                 Orientation Level: Disoriented to (pt oriented to "hospital" & "[place]" but unaware of hospital's name, oriented to year but not month, not aware of situation.)             General Comments: Pt reports need to have BM but unaware of incontinent BM in bed. Pt with delayed responses & decreased initiation.        General Comments      Exercises     Assessment/Plan    PT Assessment Patient needs continued PT services  PT Problem List Decreased strength;Cardiopulmonary status limiting activity;Decreased coordination;Decreased activity tolerance;Decreased cognition;Decreased knowledge of use of DME;Decreased balance;Decreased safety awareness;Decreased mobility       PT Treatment Interventions DME instruction;Balance training;Gait training;Stair training;Neuromuscular re-education;Therapeutic exercise;Functional mobility training;Therapeutic activities;Patient/family education;Cognitive remediation    PT Goals (Current goals can be found in the Care Plan section)  Acute Rehab PT Goals Patient Stated Goal: none stated PT Goal Formulation: With patient Time For Goal Achievement: 09/19/22 Potential to Achieve Goals: Fair    Frequency Min 4X/week     Co-evaluation               AM-PAC PT "6 Clicks" Mobility  Outcome Measure Help needed turning from your back to your side while in a flat bed without using bedrails?: A Little Help needed moving from lying on your back to sitting on the side of a flat bed without using bedrails?: A Lot Help needed moving to and from a bed to a  chair (including a wheelchair)?: Total Help needed standing up from a chair using your arms (e.g., wheelchair or bedside chair)?: Total Help needed to walk in hospital room?: Total Help needed climbing 3-5 steps with a railing? : Total 6 Click Score: 9    End of Session   Activity Tolerance:  (limited 2/2 incontinence, need to toilet) Patient left: in bed;with nursing/sitter in room Nurse Communication: Mobility status PT Visit Diagnosis: Muscle weakness (generalized) (M62.81);Difficulty in walking, not elsewhere classified (R26.2)    Time: 1829-9371 PT Time Calculation (min) (ACUTE ONLY): 18 min   Charges:   PT Evaluation $PT Eval Moderate Complexity: 1 Mod          Aleda Grana, PT, DPT 09/05/22, 12:49 PM   Sandi Mariscal 09/05/2022, 12:48 PM

## 2022-09-05 NOTE — Evaluation (Signed)
Occupational Therapy Evaluation Patient Details Name: Shaun Roberts MRN: [id number] DOB: 25-Sep-1965 Today's Date: 09/05/2022   History of Present Illness Pt is a 57 y/o M admitted on 09/03/22 after presenting to the ED with c/o acute onset of generalized weakness & failure to thrive. Pt have to have dark brown stool, which was guaiac positive. Pt was found to be hypotensive on arrival & Hgb of 6. Pt is being treated for GI bleeding likely 2/2 alcoholic gastritis, ABLA 2/2 GI bleed, & hypotension 2/2 ABLA. PMH: COPD, depression, DVT, DM2, HTN, dyslipidemia, alcohol abuse   Clinical Impression   Patient received for OT evaluation. See flowsheet below for details of function. Generally, patient requiring MOD A for bed mobility to scoot up in the bed, pt declining sitting EOB for grooming today, stating he is fatigued; anticipate overall MOD-MAX A for ADLs; was unable to t/f to standing earlier today with PT despite MAX A. Need to corroborate prior level of function with family, as pt not fully oriented. Patient will benefit from continued OT while in acute care.       Recommendations for follow up therapy are one component of a multi-disciplinary discharge planning process, led by the attending physician.  Recommendations may be updated based on patient status, additional functional criteria and insurance authorization.   Assistance Recommended at Discharge Frequent or constant Supervision/Assistance  Patient can return home with the following Two people to help with walking and/or transfers;A lot of help with bathing/dressing/bathroom;Assistance with cooking/housework;Direct supervision/assist for medications management;Direct supervision/assist for financial management;Assist for transportation;Help with stairs or ramp for entrance    Functional Status Assessment  Patient has had a recent decline in their functional status and demonstrates the ability to make significant improvements in function in  a reasonable and predictable amount of time.  Equipment Recommendations  Other (comment) (defer to next venue of care)    Recommendations for Other Services       Precautions / Restrictions Precautions Precautions: Fall Restrictions Weight Bearing Restrictions: No      Mobility Bed Mobility Overal bed mobility: Needs Assistance             General bed mobility comments: with bed in trendelenburg and pt pushing with BIL feet and pulling with arms on rail, pt still needed MOD A for scooting up in the bed. Pt declined to sit EOB today    Transfers                   General transfer comment: Pt declined to t/f today; per PT evaluation, was unable to stand despite MAX A      Balance                                           ADL either performed or assessed with clinical judgement   ADL Overall ADL's : Needs assistance/impaired     Grooming: Oral care;Set up;Bed level (with HOB raised) Grooming Details (indicate cue type and reason): pt took much increased time to open and close screw-on toothpaste cap; moderate coordination deficits; very slow to brush teeth; decreased activity tolerance. Pt also wiped hands with wet washcloth at bed level; needing cues to be thorough; fingernails noted to be soiled; pt stating it was tar from his cigarettes.  Functional mobility during ADLs:  (Did not attempt today, as PT evaluation earlier today revealed that pt unable to stand despite MAX A) General ADL Comments: Anticipate pt to require MIN-MAX A for ADLs at this time; pt shows decreased initiation of task, slow movements, decreased volition (OT offered for pt to sit EOB for grooming and he declined). Pt with UE weakness in hands and arms.     Vision Baseline Vision/History: 1 Wears glasses Ability to See in Adequate Light: 0 Adequate       Perception     Praxis      Pertinent Vitals/Pain Pain Assessment Pain  Assessment: No/denies pain     Hand Dominance     Extremity/Trunk Assessment Upper Extremity Assessment Upper Extremity Assessment: Generalized weakness (decreased grip strength; needing assist to open container of water; was able to open toothpaste cap. Some mild UE tremors noted during functional activities; weak UE upon trying to pull self up on rails in bed)   Lower Extremity Assessment Lower Extremity Assessment: Defer to PT evaluation       Communication Communication Communication: Other (comment);No difficulties (slow to respond; very polite)   Cognition Arousal/Alertness: Awake/alert Behavior During Therapy: WFL for tasks assessed/performed Overall Cognitive Status: No family/caregiver present to determine baseline cognitive functioning                                 General Comments: Pt with difficulty explaining home set up, not remembering some details and changing story; not oriented to time (thinks it is May the 6th). When OT first arrived pt acting as if he know OT, which he doesn't.     General Comments  Very polite, saying "yes ma'am" frequently; appropriate, but delayed responses. Appears very weak and fatigued from low level of activity. Unsure of actual functional baseline.    Exercises     Shoulder Instructions      Home Living Family/patient expects to be discharged to:: Private residence Living Arrangements: Alone (although pt at first states that his ex-wife and son live there; then states "we have two houses; my son has 7 kids there with him"; clarifies that he lives alone) Available Help at Discharge: Family;Available PRN/intermittently (pt states that ex-wife and son assist) Type of Home: House Home Access: Stairs to enter [employer] of Steps: 2 Entrance Stairs-Rails:  (1 rail, pt did not report which side) Home Layout: One level     Bathroom Shower/Tub: Chief Strategy Officer: Standard     Home  Equipment: Agricultural consultant (2 wheels) (states he maybe has a cane but doesn't remember)   Additional Comments: Pt vague on some details; will need to corroborate with family      Prior Functioning/Environment Prior Level of Function : Needs assist             Mobility Comments: Pt reports he was independent without AD prior to admission. ADLs Comments: Pt states he was (I) for ADLs without AD prior to admission; states his family assists with meals, alundry, driving; states he only walks short distances; spends most of his day in his recliner watching TV. Will need to corroborate with family        OT Problem List: Decreased strength;Decreased activity tolerance;Decreased cognition;Decreased safety awareness;Decreased knowledge of use of DME or AE      OT Treatment/Interventions: Self-care/ADL training;Therapeutic exercise;Therapeutic activities    OT Goals(Current goals can be found in the  care plan section) Acute Rehab OT Goals Patient Stated Goal: Get better OT Goal Formulation: With patient Time For Goal Achievement: 09/19/22 Potential to Achieve Goals: Fair ADL Goals Pt Will Perform Grooming: with modified independence;standing Pt Will Perform Lower Body Dressing: with modified independence;sit to/from stand Pt Will Transfer to Toilet: with modified independence;ambulating;bedside commode Pt Will Perform Toileting - Clothing Manipulation and hygiene: with modified independence;sit to/from stand  OT Frequency: Min 2X/week    Co-evaluation              AM-PAC OT "6 Clicks" Daily Activity     Outcome Measure Help from another person eating meals?: A Little Help from another person taking care of personal grooming?: None Help from another person toileting, which includes using toliet, bedpan, or urinal?: Total Help from another person bathing (including washing, rinsing, drying)?: A Lot Help from another person to put on and taking off regular upper body clothing?:  Total Help from another person to put on and taking off regular lower body clothing?: A Little 6 Click Score: 14   End of Session Nurse Communication: Mobility status;Other (comment) (the fact that call bell is not working)  Activity Tolerance: Patient limited by fatigue Patient left: in bed;with bed alarm set;with call bell/phone within reach  OT Visit Diagnosis: Muscle weakness (generalized) (M62.81)                Time: 1401-1430 OT Time Calculation (min): 29 min Charges:  OT General Charges $OT Visit: 1 Visit OT Evaluation $OT Eval Moderate Complexity: 1 Mod OT Treatments $Self Care/Home Management : 8-22 mins Linward FosterLoy Anne Yancarlos Berthold, MS, OTR/L  Alvester MorinLoy  Albertine Lafoy 09/05/2022, 2:48 PM

## 2022-09-06 DIAGNOSIS — K2901 Acute gastritis with bleeding: Secondary | ICD-10-CM | POA: Diagnosis not present

## 2022-09-06 DIAGNOSIS — D62 Acute posthemorrhagic anemia: Secondary | ICD-10-CM | POA: Diagnosis not present

## 2022-09-06 DIAGNOSIS — D61818 Other pancytopenia: Secondary | ICD-10-CM | POA: Diagnosis not present

## 2022-09-06 DIAGNOSIS — E8729 Other acidosis: Secondary | ICD-10-CM | POA: Diagnosis not present

## 2022-09-06 LAB — CBC
HCT: 22.1 % — ABNORMAL LOW (ref 39.0–52.0)
Hemoglobin: 7.4 g/dL — ABNORMAL LOW (ref 13.0–17.0)
MCH: 31.1 pg (ref 26.0–34.0)
MCHC: 33.5 g/dL (ref 30.0–36.0)
MCV: 92.9 fL (ref 80.0–100.0)
Platelets: 33 10*3/uL — ABNORMAL LOW (ref 150–400)
RBC: 2.38 MIL/uL — ABNORMAL LOW (ref 4.22–5.81)
RDW: 16.1 % — ABNORMAL HIGH (ref 11.5–15.5)
WBC: 4 10*3/uL (ref 4.0–10.5)
nRBC: 3.7 % — ABNORMAL HIGH (ref 0.0–0.2)

## 2022-09-06 LAB — HEMOGLOBIN A1C
Hgb A1c MFr Bld: 5.6 % (ref 4.8–5.6)
Mean Plasma Glucose: 114 mg/dL

## 2022-09-06 LAB — URINALYSIS, COMPLETE (UACMP) WITH MICROSCOPIC
Bacteria, UA: NONE SEEN
Bilirubin Urine: NEGATIVE
Glucose, UA: >=500 mg/dL — AB
Ketones, ur: NEGATIVE mg/dL
Leukocytes,Ua: NEGATIVE
Nitrite: NEGATIVE
Protein, ur: 30 mg/dL — AB
Specific Gravity, Urine: 1.023 (ref 1.005–1.030)
pH: 5 (ref 5.0–8.0)

## 2022-09-06 LAB — GLUCOSE, CAPILLARY
Glucose-Capillary: 110 mg/dL — ABNORMAL HIGH (ref 70–99)
Glucose-Capillary: 183 mg/dL — ABNORMAL HIGH (ref 70–99)
Glucose-Capillary: 177 mg/dL — ABNORMAL HIGH (ref 70–99)
Glucose-Capillary: 182 mg/dL — ABNORMAL HIGH (ref 70–99)

## 2022-09-06 LAB — BASIC METABOLIC PANEL
Anion gap: 5 (ref 5–15)
BUN: 17 mg/dL (ref 6–20)
CO2: 25 mmol/L (ref 22–32)
Calcium: 7.7 mg/dL — ABNORMAL LOW (ref 8.9–10.3)
Chloride: 106 mmol/L (ref 98–111)
Creatinine, Ser: 0.68 mg/dL (ref 0.61–1.24)
GFR, Estimated: >60 mL/min (ref >60)
Glucose, Bld: 133 mg/dL — ABNORMAL HIGH (ref 70–99)
Potassium: 3.8 mmol/L (ref 3.5–5.1)
Sodium: 136 mmol/L (ref 135–145)

## 2022-09-06 LAB — PHOSPHORUS: Phosphorus: 3.1 mg/dL (ref 2.5–4.6)

## 2022-09-06 LAB — CULTURE, BLOOD (ROUTINE X 2)
Culture: NO GROWTH
Special Requests: ADEQUATE

## 2022-09-06 LAB — MAGNESIUM: Magnesium: 1.6 mg/dL — ABNORMAL LOW (ref 1.7–2.4)

## 2022-09-06 MED ORDER — HYDROCORTISONE SOD SUC (PF) 100 MG IJ SOLR
100.0000 mg | Freq: Once | INTRAMUSCULAR | Status: AC
  Administered 2022-09-06: 100 mg via INTRAVENOUS
  Filled 2022-09-06: qty 2

## 2022-09-06 MED ORDER — FLUDROCORTISONE 0.1 MG/ML ORAL SUSPENSION
0.1000 mg | Freq: Every day | ORAL | Status: DC
  Filled 2022-09-06: qty 1

## 2022-09-06 MED ORDER — MIDODRINE HCL 5 MG PO TABS
15.0000 mg | ORAL_TABLET | Freq: Three times a day (TID) | ORAL | Status: DC
  Administered 2022-09-06 – 2022-09-09 (×9): 15 mg via ORAL
  Filled 2022-09-06 (×9): qty 3

## 2022-09-06 MED ORDER — MAGNESIUM SULFATE 2 GM/50ML IV SOLN
2.0000 g | Freq: Once | INTRAVENOUS | Status: AC
  Administered 2022-09-06: 2 g via INTRAVENOUS
  Filled 2022-09-06: qty 50

## 2022-09-06 MED ORDER — FLUDROCORTISONE ACETATE 0.1 MG PO TABS
0.1000 mg | ORAL_TABLET | Freq: Every day | ORAL | Status: DC
  Administered 2022-09-06 – 2022-09-09 (×4): 0.1 mg via ORAL
  Filled 2022-09-06 (×4): qty 1

## 2022-09-06 MED ORDER — PEG 3350-KCL-NA BICARB-NACL 420 G PO SOLR
4000.0000 mL | Freq: Once | ORAL | Status: AC
  Administered 2022-09-06: 4000 mL via ORAL
  Filled 2022-09-06: qty 4000

## 2022-09-06 NOTE — Progress Notes (Signed)
GI Inpatient Follow-up Note  Subjective:  Patient seen in follow-up for malnourishment, scurvy, and heme positive stool. No acute events overnight. He denies any new complaints or concerns. No overt hematochezia, melena, or hematemesis. Hemoglobin 7.4 this morning.   Scheduled Inpatient Medications:   feeding supplement (GLUCERNA SHAKE)  237 mL Oral BID BM   folic acid  1 mg Oral Daily   insulin aspart  0-9 Units Subcutaneous TID WC   insulin glargine-yfgn  10 Units Subcutaneous QHS   midodrine  10 mg Oral TID WC   multivitamin with minerals  1 tablet Oral Daily   pantoprazole (PROTONIX) IV  40 mg Intravenous Q12H   polyethylene glycol-electrolytes  4,000 mL Oral Once   thiamine  100 mg Oral Daily   Or   thiamine  100 mg Intravenous Daily    Continuous Inpatient Infusions:    lactated ringers 100 mL/hr at 09/06/22 0850   magnesium sulfate bolus IVPB 2 g (09/06/22 0852)    PRN Inpatient Medications:  acetaminophen **OR** acetaminophen, LORazepam **OR** LORazepam, ondansetron **OR** ondansetron (ZOFRAN) IV, traZODone  Review of Systems: Constitutional: Weight is stable.  Eyes: No changes in vision. ENT: No oral lesions, sore throat.  GI: see HPI.  Heme/Lymph: No easy bruising.  CV: No chest pain.  GU: No hematuria.  Integumentary: No rashes.  Neuro: No headaches.  Psych: No depression/anxiety.  Endocrine: No heat/cold intolerance.  Allergic/Immunologic: No urticaria.  Resp: No cough, SOB.  Musculoskeletal: No joint swelling.    Physical Examination: BP (!) 87/52 (BP Location: Left Arm)   Pulse 97   Temp 98.1 F (36.7 C)   Resp 16   Ht 5\' 11"  (1.803 m)   Wt 77.5 kg   SpO2 100%   BMI 23.83 kg/m  Gen: NAD, alert and oriented x 4 HEENT: PEERLA, EOMI, Neck: supple, no JVD or thyromegaly Chest: CTA bilaterally, no wheezes, crackles, or other adventitious sounds CV: RRR, no m/g/c/r Abd: soft, NT, ND, +BS in all four quadrants; no HSM, guarding, ridigity, or  rebound tenderness Ext: no edema, well perfused with 2+ pulses, Skin: no rash or lesions noted Lymph: no LAD  Data: Lab Results  Component Value Date   WBC 4.0 09/06/2022   HGB 7.4 (L) 09/06/2022   HCT 22.1 (L) 09/06/2022   MCV 92.9 09/06/2022   PLT 33 (L) 09/06/2022   Recent Labs  Lab 09/04/22 2200 09/05/22 0458 09/06/22 0332  HGB 8.8* 8.0* 7.4*   Lab Results  Component Value Date   NA 136 09/06/2022   K 3.8 09/06/2022   CL 106 09/06/2022   CO2 25 09/06/2022   BUN 17 09/06/2022   CREATININE 0.68 09/06/2022   Lab Results  Component Value Date   ALT 45 (H) 09/03/2022   AST 54 (H) 09/03/2022   ALKPHOS 37 (L) 09/03/2022   BILITOT 5.9 (H) 09/03/2022   Recent Labs  Lab 09/03/22 2039  INR 1.8*    Assessment/Plan:  57 y/o Caucasian male with a PMH of HTN, HLD, EtOH abuse, Hx of DVT, T2DM, depression, COPD, and hx of medication noncompliance presented to the [facility] ED via EMS 4/5 for chief complaint of generalized weakness, poor PO intake, and reduced urinary/fecal output. He was found to have significant anemia with drop in hemoglobin from 11.1 one month ago to 6.6. GI consulted for further evaluation and management.    Normocytic anemia - hemoglobin 6.3 with normal MCV and no evidence of iron-deficiency. Suspect anemia is multifactorial in setting of  severe nutritional deficiencies and bone marrow suppression from daily EtOH abuse. No concerns for acute GI bleeding. No overt hematochezia or melena.    Heme positive stool - positive colon cancer screening test and has no bearing on work-up for anemia or bleeding.    AKI - likely prerenal 2/2 hypovolemia and dehydration. Resolved.    Alcoholic ketoacidosis    Daily EtOH abuse   Hx of DVT - no longer on chronic anticoagulation   Recommendations:   - Maintain 2 large bore IVs for access - Continue to monitor serial H&H. Transfuse for Hgb <7.0. Transfuse 1 unit this morning per primary team.  - Continue PPI for  gastric protection - Continue supportive care per primary team with IV fluid hydration, pain control, and antiemetics prn - Continue management of medical comorbidities per primary team - He is at high-risk for withdrawal. CIWA protocol.  - Continue to monitor daily LFTs - Continue thiamine administration  - No evidence of overt GI bleeding or significant hemodynamic changes - EGD and colonoscopy planned for tomorrow with Dr. Norma Fredrickson - Clear liquid diet today. NPO midnight. Bowel prep orders in.  - GI following along with you  I reviewed the risks (including bleeding, perforation, infection, anesthesia complications, cardiac/respiratory complications), benefits and alternatives of EGD and colonoscopy. Patient consents to proceed.      Please call with questions or concerns.  Jacob Moores, PA-C [facility] [phone]

## 2022-09-06 NOTE — Consult Note (Signed)
   [facility] CM Inpatient Consult   09/06/2022  Reuben Staude [birth year] [id number]     Location: [facility] RN Hospital Liaison screened pt remotely([facility]).   [facility] Customer service manager [facility]) Accountable Care Organization [ACO] Patient: Insurance [facility])    Primary Care Provider:  [facility]   Patient screened for readmission hospitalization with noted medium risk score for unplanned readmission risk with 1 IP in 6 months. [facility]/Population Health RN liaison will assess for potential [facility]) Care Management service needs for post hospital transition for care coordination.  Pt discharged to [employer] will collaborate with PAS-RN to follow.  Plan.  [facility]/Population Health RN Liaison will continue to follow ongoing disposition in assessing for post hospital community care coordination/management needs.  Referral request for community care coordination: pending disposition.   [facility] Care Management/Population Health does not replace or interfere with any arrangements made by the Inpatient Transition of Care team.   For questions contact:   Elliot Cousin, RN, BSN [facility] Liaison [place]   [facility]  Population Health Office Hours MTWF 7:30 am to 6 pm [phone] mobile [phone] [Office toll free line][facility] Office Hours are M-F 8:30 - 5 pm 24 hour nurse advise line [phone] [email or web address]  [email or web address]

## 2022-09-06 NOTE — Care Management Important Message (Signed)
Important Message  Patient Details  Name: Shaun Roberts MRN: [id number] Date of Birth: March 03, 1966   Medicare Important Message Given:  Yes     Johnell Comings 09/06/2022, 11:06 AM

## 2022-09-06 NOTE — Progress Notes (Signed)
Progress Note   Patient: Shaun Roberts HKV:[id number] DOB: 1965-09-30 DOA: 09/03/2022     3 DOS: the patient was seen and examined on 09/06/2022   Brief hospital course: Shaun Roberts is a 57 y.o. Caucasian male with medical history significant for COPD, depression, DVT, type 2 diabetes mellitus, hypertension, dyslipidemia and alcohol abuse, who presented to the emergency room with acute onset of generalized weakness with failure to thrive.  Patient was also found to have dark brown stool which was guaiac positive. Upon arriving the hospital, patient was hypotensive with blood pressure 87/63, metabolic acidosis, with elevated anion gap, significant lactic acidosis.  Hemoglobin 6.0. Patient received IV fluid bolus, blood transfusion, and IV Protonix.  GI consult obtained.   Principal Problem:   GI bleeding Active Problems:   Hypotension   DKA, type 2, not at goal   AKI (acute kidney injury)   Alcohol abuse   History of DVT (deep vein thrombosis)   Alcoholic ketoacidosis   Acute blood loss anemia   Thrombocytopenia   Failure to thrive in adult   Hypophosphatemia   Pressure injury of skin   Protein-calorie malnutrition, severe   Pancytopenia   Hypomagnesemia   Assessment and Plan:  GI bleeding likely due to alcoholic gastritis. Acute blood loss anemia secondary to GI bleed. Hypotension secondary to acute blood loss anemia. Patient received IV fluids, blood transfusion, hemoglobin is better. Upper GI bleed most likely due to alcoholic gastritis, being seen by GI, continued on Protonix.  Iron level is adequate,  B12 elevated Right upper ultrasound showed steatohepatitis. Patient still has significant hypotension, appears to have some chronic component.  On midodrine, I will increase the dosage to 15 mg 3 times a day.  Cortisol level relatively low, patient may have a relative adrenal sufficiency, will give a dose of hydrocortisone at 100 mg x 1, continued with Florinef 0.1 mg  daily. Patient is scheduled for EGD/colonoscopy tomorrow, no overt GI bleed. Recheck hemoglobin tomorrow, transfuse with hemoglobin less than 7.   DKA, type 2, not at goal AKI (acute kidney injury) Alcohol ketoacidosis. Hypophosphatemia. Hypomagnesemia. Conditions are improved.  Magnesium 1.6, give 2 g magnesium sulfate.   Severe protein calorie malnutrition secondary to alcohol drinking. Patient had a significant muscle atrophy, start Glucerna.  Pancytopenia with anemia and severe thrombocytopenia. Anemia as above, thrombocytopenia appears to be secondary to alcohol abuse.   Alcohol abuse Continue CIWA protocol.   History of DVT (deep vein thrombosis) Eliquis on hold due to concern of GI bleed.   Generalized weakness. PT/OT.      Subjective:  Patient seems to be doing better today, does not feel dizzy.  No shortness of breath.  Physical Exam: Vitals:   09/05/22 1943 09/06/22 0231 09/06/22 0455 09/06/22 0902  BP: (!) 84/58  (!) 87/52 (!) 81/56  Pulse: 91  97 96  Resp: 18  16   Temp: 98.9 F (37.2 C)  98.1 F (36.7 C) 98.3 F (36.8 C)  TempSrc: Oral   Oral  SpO2: 100%  100% 99%  Weight:  77.5 kg    Height:       General exam: Appears calm and comfortable  Respiratory system: Clear to auscultation. Respiratory effort normal. Cardiovascular system: S1 & S2 heard, RRR. No JVD, murmurs, rubs, gallops or clicks. No pedal edema. Gastrointestinal system: Abdomen is nondistended, soft and nontender. No organomegaly or masses felt. Normal bowel sounds heard. Central nervous system: Alert and oriented x2. No focal neurological deficits. Extremities: Symmetric 5 x 5  power. Skin: No rashes, lesions or ulcers Psychiatry: Mood & affect appropriate.    Data Reviewed:  Lab results reviewed.  Family Communication: son updated  Disposition: Status is: Inpatient Remains inpatient appropriate because: severity of disease     Time spent: 50 minutes  Author: Marrion Coy, MD 09/06/2022 12:23 PM  For on call review [email or web address].

## 2022-09-06 NOTE — Progress Notes (Signed)
Physical Therapy Treatment Patient Details Name: Shaun Roberts MRN: [id number] DOB: 11-28-1965 Today's Date: 09/06/2022   History of Present Illness Pt is a 57 y/o M admitted on 09/03/22 after presenting to the ED with c/o acute onset of generalized weakness & failure to thrive. Pt have to have dark brown stool, which was guaiac positive. Pt was found to be hypotensive on arrival & Hgb of 6. Pt is being treated for GI bleeding likely 2/2 alcoholic gastritis, ABLA 2/2 GI bleed, & hypotension 2/2 ABLA. PMH: COPD, depression, DVT, DM2, HTN, dyslipidemia, alcohol abuse    PT Comments    Pt was long sitting in bed upon arrival. Able to correctly answer two of four orientation questions, however poor insight of why he is in the hospital and what the current situation is. Extremely poor safety awareness. He needs more assistance to exit R side of bed. Severe tremors upon sitting up EOB. Somewhat subside with prolonged sitting EOB. Eventually pt was able to stand from slightly elevated bed height with max assist. Severe posterior push noted. Limited tolerance to standing. Pt is far from his baseline and will continue to benefit from skilled PT at DC to maximize independence and safety with all ADLs.    Recommendations for follow up therapy are one component of a multi-disciplinary discharge planning process, led by the attending physician.  Recommendations may be updated based on patient status, additional functional criteria and insurance authorization.     Assistance Recommended at Discharge Frequent or constant Supervision/Assistance  Patient can return home with the following A lot of help with walking and/or transfers;A lot of help with bathing/dressing/bathroom;Assistance with cooking/housework;Direct supervision/assist for medications management;Direct supervision/assist for financial management;Assist for transportation;Help with stairs or ramp for entrance   Equipment Recommendations  Other  (comment) (Defer to next level of care)       Precautions / Restrictions Precautions Precautions: Fall Restrictions Weight Bearing Restrictions: No     Mobility  Bed Mobility Overal bed mobility: Needs Assistance Bed Mobility: Supine to Sit, Sit to Supine  Supine to sit: Min assist, Mod assist, HOB elevated Sit to supine: Min assist General bed mobility comments: pt has severe tremors upon sitting up EOB. That did subside after a few minutes in sitting. Increased time + heavy use of bed functions to achieve   sitting and return to long sitting    Transfers Overall transfer level: Needs assistance Equipment used: Rolling walker (2 wheels) Transfers: Sit to/from Stand Sit to Stand: Max assist, From elevated surface  General transfer comment: max assist of one to stand at EOB. severe posterior push. limited standing tolerance.      Balance Overall balance assessment: Needs assistance Sitting-balance support: Feet supported, Bilateral upper extremity supported Sitting balance-Leahy Scale: Good     Standing balance support: Bilateral upper extremity supported, Reliant on assistive device for balance, During functional activity Standing balance-Leahy Scale: Zero Standing balance comment: severe posterior push in standing       Cognition Arousal/Alertness: Awake/alert Behavior During Therapy: WFL for tasks assessed/performed Overall Cognitive Status: No family/caregiver present to determine baseline cognitive functioning    General Comments: Pt is A  and oriented x 2               Pertinent Vitals/Pain Pain Assessment Pain Assessment: No/denies pain Faces Pain Scale: No hurt     PT Goals (current goals can now be found in the care plan section) Acute Rehab PT Goals Patient Stated Goal: none stated Progress towards PT  goals: Progressing toward goals    Frequency    Min 4X/week      PT Plan Current plan remains appropriate       AM-PAC PT "6 Clicks"  Mobility   Outcome Measure  Help needed turning from your back to your side while in a flat bed without using bedrails?: A Lot Help needed moving from lying on your back to sitting on the side of a flat bed without using bedrails?: A Lot Help needed moving to and from a bed to a chair (including a wheelchair)?: Total Help needed standing up from a chair using your arms (e.g., wheelchair or bedside chair)?: Total Help needed to walk in hospital room?: Total Help needed climbing 3-5 steps with a railing? : Total 6 Click Score: 8    End of Session Equipment Utilized During Treatment: Gait belt Activity Tolerance: Patient tolerated treatment well Patient left: in bed;with call bell/phone within reach;with bed alarm set Nurse Communication: Mobility status PT Visit Diagnosis: Muscle weakness (generalized) (M62.81);Difficulty in walking, not elsewhere classified (R26.2)     Time: 2575-0518 PT Time Calculation (min) (ACUTE ONLY): 18 min  Charges:  $Therapeutic Activity: 8-22 mins                     Jetta Lout PTA 09/06/22, 4:09 PM

## 2022-09-07 DIAGNOSIS — N179 Acute kidney failure, unspecified: Secondary | ICD-10-CM | POA: Diagnosis not present

## 2022-09-07 DIAGNOSIS — D62 Acute posthemorrhagic anemia: Secondary | ICD-10-CM | POA: Diagnosis not present

## 2022-09-07 DIAGNOSIS — D61818 Other pancytopenia: Secondary | ICD-10-CM | POA: Diagnosis not present

## 2022-09-07 DIAGNOSIS — E8729 Other acidosis: Secondary | ICD-10-CM | POA: Diagnosis not present

## 2022-09-07 LAB — GLUCOSE, CAPILLARY
Glucose-Capillary: 215 mg/dL — ABNORMAL HIGH (ref 70–99)
Glucose-Capillary: 187 mg/dL — ABNORMAL HIGH (ref 70–99)
Glucose-Capillary: 179 mg/dL — ABNORMAL HIGH (ref 70–99)
Glucose-Capillary: 164 mg/dL — ABNORMAL HIGH (ref 70–99)

## 2022-09-07 LAB — BASIC METABOLIC PANEL
Anion gap: 10 (ref 5–15)
BUN: 14 mg/dL (ref 6–20)
CO2: 19 mmol/L — ABNORMAL LOW (ref 22–32)
Calcium: 7.8 mg/dL — ABNORMAL LOW (ref 8.9–10.3)
Chloride: 104 mmol/L (ref 98–111)
Creatinine, Ser: 0.65 mg/dL (ref 0.61–1.24)
GFR, Estimated: >60 mL/min (ref >60)
Glucose, Bld: 225 mg/dL — ABNORMAL HIGH (ref 70–99)
Potassium: 4.2 mmol/L (ref 3.5–5.1)
Sodium: 133 mmol/L — ABNORMAL LOW (ref 135–145)

## 2022-09-07 LAB — CBC
HCT: 28.6 % — ABNORMAL LOW (ref 39.0–52.0)
Hemoglobin: 9 g/dL — ABNORMAL LOW (ref 13.0–17.0)
MCH: 31.1 pg (ref 26.0–34.0)
MCHC: 31.5 g/dL (ref 30.0–36.0)
MCV: 99 fL (ref 80.0–100.0)
Platelets: 29 10*3/uL — CL (ref 150–400)
RBC: 2.89 MIL/uL — ABNORMAL LOW (ref 4.22–5.81)
RDW: 17.4 % — ABNORMAL HIGH (ref 11.5–15.5)
WBC: 3.6 10*3/uL — ABNORMAL LOW (ref 4.0–10.5)
nRBC: 2 % — ABNORMAL HIGH (ref 0.0–0.2)

## 2022-09-07 LAB — MAGNESIUM: Magnesium: 1.9 mg/dL (ref 1.7–2.4)

## 2022-09-07 MED ORDER — SODIUM BICARBONATE 650 MG PO TABS
650.0000 mg | ORAL_TABLET | Freq: Three times a day (TID) | ORAL | Status: DC
  Administered 2022-09-07 – 2022-09-08 (×4): 650 mg via ORAL
  Filled 2022-09-07 (×4): qty 1

## 2022-09-07 MED ORDER — SODIUM CHLORIDE 0.9% IV SOLUTION: Freq: Once | INTRAVENOUS | Status: AC

## 2022-09-07 NOTE — TOC Initial Note (Addendum)
Transition of Care [facility]) - Initial/Assessment Note    Patient Details  Name: Shaun Roberts MRN: [id number] Date of Birth: June 16, 1965  Transition of Care [facility]) CM/SW Contact:    Chapman Fitch, RN Phone Number: 09/07/2022, 10:09 AM  Clinical Narrative:                      Patient A&O x2 Admitted for: GI bleed Admitted from: home alone PCP: clark  Current home health/prior home health/DME: NA  Therapy recommending SNF, Patient agreeable PASRR obtained, fl2 sent for signature Bed Search initiated   Patient gave permission for son Leonette Most to be updated.  VM left   SA resources added to avs Patient Goals and CMS Choice            Expected Discharge Plan and Services                                              Prior Living Arrangements/Services                       Activities of Daily Living      Permission Sought/Granted                  Emotional Assessment              Admission diagnosis:  Alcoholic ketoacidosis [E87.29] Acute blood loss anemia [D62] Alcohol abuse [F10.10] GI bleeding [K92.2] Wernicke encephalopathy [E51.2] Gastrointestinal hemorrhage, unspecified gastrointestinal hemorrhage type [K92.2] Patient Active Problem List   Diagnosis Date Noted   Pancytopenia 09/06/2022   Hypomagnesemia 09/06/2022   Hypophosphatemia 09/05/2022   Pressure injury of skin 09/05/2022   Protein-calorie malnutrition, severe 09/05/2022   Alcohol abuse 09/04/2022   Hypotension 09/04/2022   AKI (acute kidney injury) 09/04/2022   DKA, type 2, not at goal 09/04/2022   History of DVT (deep vein thrombosis) 09/04/2022   Alcoholic ketoacidosis 09/04/2022   Acute blood loss anemia 09/04/2022   Thrombocytopenia 09/04/2022   Failure to thrive in adult 09/04/2022   GI bleeding 09/03/2022   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   Vitamin D deficiency 08/11/2021   Acute deep vein thrombosis (DVT) of right popliteal vein 08/11/2021    Hyponatremia 08/11/2021   Moderate episode of recurrent major depressive disorder 12/31/2019   Insomnia 06/05/2018   Special screening for malignant neoplasms, colon    Benign neoplasm of transverse colon    Polyp of sigmoid colon    Preventative health care 05/10/2018   Tobacco abuse 05/10/2018   Trigger finger 05/04/2018   Alcohol dependence in early full remission 05/04/2018   Rib fractures 04/13/2018   COPD (chronic obstructive pulmonary disease) 04/13/2018   Controlled type 2 diabetes mellitus with hyperglycemia 04/13/2018   PCP:  Doreene Nest, NP Pharmacy:   [facility] [phone] Judithann Sheen, [place] - [address] [place] [address] Phone: [phone] Fax: [phone]     Social Determinants of Health (SDOH) Social History: SDOH Screenings   Depression (PHQ2-9): Medium Risk (12/31/2019)  Tobacco Use: High Risk (08/11/2021)   SDOH Interventions:     Readmission Risk Interventions     No data to display

## 2022-09-07 NOTE — NC FL2 (Signed)
[place] MEDICAID FL2 LEVEL OF CARE FORM     IDENTIFICATION  Patient Name: Shaun Roberts Birthdate: [birth year] Sex: male Admission Date (Current Location): 09/03/2022  [facility] and [place] Number:  Chiropodist and Address:         Provider Number: [id number]  Attending Physician Name and Address:  Marrion Coy, MD  Relative Name and Phone Number:       Current Level of Care: Hospital Recommended Level of Care: Skilled Nursing Facility Prior Approval Number:    Date Approved/Denied:   PASRR Number: [id number]  Discharge Plan: SNF    Current Diagnoses: Patient Active Problem List   Diagnosis Date Noted   Pancytopenia 09/06/2022   Hypomagnesemia 09/06/2022   Hypophosphatemia 09/05/2022   Pressure injury of skin 09/05/2022   Protein-calorie malnutrition, severe 09/05/2022   Alcohol abuse 09/04/2022   Hypotension 09/04/2022   AKI (acute kidney injury) 09/04/2022   DKA, type 2, not at goal 09/04/2022   History of DVT (deep vein thrombosis) 09/04/2022   Alcoholic ketoacidosis 09/04/2022   Acute blood loss anemia 09/04/2022   Thrombocytopenia 09/04/2022   Failure to thrive in adult 09/04/2022   GI bleeding 09/03/2022   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   Vitamin D deficiency 08/11/2021   Acute deep vein thrombosis (DVT) of right popliteal vein 08/11/2021   Hyponatremia 08/11/2021   Moderate episode of recurrent major depressive disorder 12/31/2019   Insomnia 06/05/2018   Special screening for malignant neoplasms, colon    Benign neoplasm of transverse colon    Polyp of sigmoid colon    Preventative health care 05/10/2018   Tobacco abuse 05/10/2018   Trigger finger 05/04/2018   Alcohol dependence in early full remission 05/04/2018   Rib fractures 04/13/2018   COPD (chronic obstructive pulmonary disease) 04/13/2018   Controlled type 2 diabetes mellitus with hyperglycemia 04/13/2018    Orientation RESPIRATION BLADDER Height & Weight     Self, Place   Normal Incontinent Weight: 77.5 kg Height:  5\' 11"  (180.3 cm)  BEHAVIORAL SYMPTOMS/MOOD NEUROLOGICAL BOWEL NUTRITION STATUS      Incontinent Diet (clears.  will be advanced)  AMBULATORY STATUS COMMUNICATION OF NEEDS Skin   Extensive Assist Verbally Bruising                       Personal Care Assistance Level of Assistance              Functional Limitations Info             SPECIAL CARE FACTORS FREQUENCY  PT (By licensed PT), OT (By licensed OT)                    Contractures Contractures Info: Not present    Additional Factors Info  Code Status, Allergies Code Status Info: Full Allergies Info: NKDA           Current Medications (09/07/2022):  This is the current hospital active medication list Current Facility-Administered Medications  Medication Dose Route Frequency Provider Last Rate Last Admin   acetaminophen (TYLENOL) tablet 650 mg  650 mg Oral Q6H PRN Mansy, Jan A, MD       Or   acetaminophen (TYLENOL) suppository 650 mg  650 mg Rectal Q6H PRN Mansy, Jan A, MD       feeding supplement (GLUCERNA SHAKE) (GLUCERNA SHAKE) liquid 237 mL  237 mL Oral BID BM Marrion Coy, MD   237 mL at 09/06/22 1553   fludrocortisone (FLORINEF) tablet  0.1 mg  0.1 mg Oral Daily Drusilla Kanner, RPH   0.1 mg at 09/07/22 0854   folic acid (FOLVITE) tablet 1 mg  1 mg Oral Daily Mansy, Jan A, MD   1 mg at 09/07/22 0849   insulin aspart (novoLOG) injection 0-9 Units  0-9 Units Subcutaneous TID WC Marrion Coy, MD   3 Units at 09/07/22 0847   insulin glargine-yfgn (SEMGLEE) injection 10 Units  10 Units Subcutaneous QHS Marrion Coy, MD   10 Units at 09/06/22 2208   midodrine (PROAMATINE) tablet 15 mg  15 mg Oral TID WC Marrion Coy, MD   15 mg at 09/07/22 0848   multivitamin with minerals tablet 1 tablet  1 tablet Oral Daily Mansy, Jan A, MD   1 tablet at 09/07/22 0850   ondansetron (ZOFRAN) tablet 4 mg  4 mg Oral Q6H PRN Mansy, Jan A, MD       Or   ondansetron [facility])  injection 4 mg  4 mg Intravenous Q6H PRN Mansy, Jan A, MD       pantoprazole (PROTONIX) injection 40 mg  40 mg Intravenous Q12H Mansy, Jan A, MD   40 mg at 09/07/22 3403   thiamine (VITAMIN B1) tablet 100 mg  100 mg Oral Daily Mansy, Jan A, MD   100 mg at 09/07/22 7096   Or   thiamine (VITAMIN B1) injection 100 mg  100 mg Intravenous Daily Mansy, Jan A, MD   100 mg at 09/04/22 1025   traZODone (DESYREL) tablet 25 mg  25 mg Oral QHS PRN Mansy, Vernetta Honey, MD         Discharge Medications: Please see discharge summary for a list of discharge medications.  Relevant Imaging Results:  Relevant Lab Results:   Additional Information [id number]  Chapman Fitch, RN

## 2022-09-07 NOTE — Anesthesia Preprocedure Evaluation (Addendum)
Anesthesia Evaluation  Patient identified by MRN, date of birth, ID band Patient awake    Reviewed: Allergy & Precautions, H&P , NPO status , Patient's Chart, lab work & pertinent test results  Airway Mallampati: II  TM Distance: >3 FB Neck ROM: full    Dental no notable dental hx. (+) Chipped, Dental Advidsory Given   Pulmonary neg shortness of breath, COPD, Current Smoker and Patient abstained from smoking.   Pulmonary exam normal        Cardiovascular hypertension, (-) Past MI and (-) CABG negative cardio ROS Normal cardiovascular exam     Neuro/Psych  PSYCHIATRIC DISORDERS      negative neurological ROS     GI/Hepatic negative GI ROS,,,(+) Cirrhosis         Endo/Other  negative endocrine ROSdiabetes    Renal/GU negative Renal ROS  negative genitourinary   Musculoskeletal   Abdominal   Peds  Hematology negative hematology ROS (+) Blood dyscrasia, anemia Thrombocytopenia   Anesthesia Other Findings Patient with a history of low platelets. Today his platelet level is 46k. Discussed the increased risk of bleeding and the effects it could have for heart attack, stroke, even death. Also discussed the increased risk of his low potassium levels that puts him at risk for arrhythmias. Patient stated he understood and agreed to proceed with anesthesia.   PER IM: GI bleeding likely due to alcoholic gastritis. Acute blood loss anemia secondary to GI bleed. Hypotension secondary to acute blood loss anemia. Patient received IV fluids, blood transfusion, hemoglobin is better. Upper GI bleed most likely due to alcoholic gastritis, being seen by GI, continued on Protonix.  Iron level is adequate,  B12 elevated Right upper ultrasound showed steatohepatitis. On midodrine. Cortisol level relatively low, patient may have a relative adrenal sufficiency, will give a dose of hydrocortisone at 100 mg x 1, continued with Florinef 0.1 mg  daily.   DKA, type 2, not at goal AKI (acute kidney injury) Alcohol ketoacidosis. Hypophosphatemia. Hypomagnesemia. Conditions are improved.  Magnesium 1.6, give 2 g magnesium sulfate.   Severe protein calorie malnutrition secondary to alcohol drinking. Patient had a significant muscle atrophy   Pancytopenia with anemia and severe thrombocytopenia. Anemia as above, thrombocytopenia appears to be secondary to alcohol abuse.   Alcohol abuse Continue CIWA protocol.      Past Medical History: No date: Alcohol abuse 04/13/2018: Alcohol withdrawal (HCC) No date: Arthritis No date: Colon polyps No date: COPD (chronic obstructive pulmonary disease) (HCC) No date: Depression No date: Diabetes mellitus without complication (HCC) No date: Hyperlipidemia No date: Hypertension  Past Surgical History: 2016: ANTERIOR CRUCIATE LIGAMENT REPAIR; Right 05/18/2018: COLONOSCOPY WITH PROPOFOL; N/A     Comment:  Procedure: COLONOSCOPY WITH BIOPSIES;  Surgeon: Midge Minium, MD;  Location: [facility];  Service:               Endoscopy;  Laterality: N/A; No date: KNEE RECONSTRUCTION 2016: MEDIAL COLLATERAL LIGAMENT AND LATERAL COLLATERAL LIGAMENT  REPAIR, KNEE; Right 05/18/2018: POLYPECTOMY; N/A     Comment:  Procedure: POLYPECTOMY;  Surgeon: Midge Minium, MD;                Location: [facility];  Service: Endoscopy;                Laterality: N/A;  BMI    Body Mass Index: 23.83 kg/m      Reproductive/Obstetrics negative OB ROS  Anesthesia Physical Anesthesia Plan  ASA: 4  Anesthesia Plan: General   Post-op Pain Management: Minimal or no pain anticipated   Induction: Intravenous  PONV Risk Score and Plan: 3 and Propofol infusion, TIVA and Ondansetron  Airway Management Planned: Nasal Cannula  Additional Equipment: None  Intra-op Plan:   Post-operative Plan:   Informed Consent: I have  reviewed the patients History and Physical, chart, labs and discussed the procedure including the risks, benefits and alternatives for the proposed anesthesia with the patient or authorized representative who has indicated his/her understanding and acceptance.     Dental Advisory Given  Plan Discussed with: CRNA and Surgeon  Anesthesia Plan Comments: (Discussed risks of anesthesia with patient, including possibility of difficulty with spontaneous ventilation under anesthesia necessitating airway intervention, PONV, and rare risks such as cardiac or respiratory or neurological events, and allergic reactions. Discussed the role of CRNA in patient's perioperative care. Patient understands.)        Anesthesia Quick Evaluation

## 2022-09-07 NOTE — Progress Notes (Signed)
[facility] GI inpatient brief Progress Note  Patient with low platelet count today (29k). No obvious recurrent bleeding.  Vitals:   09/07/22 1106 09/07/22 1138  BP: 96/65 108/67  Pulse: 91 93  Resp: 18 18  Temp: 97.8 F (36.6 C) 97.8 F (36.6 C)  SpO2: 100% 100%       Labs:    Latest Ref Rng & Units 09/07/2022    4:12 AM 09/06/2022    3:32 AM 09/05/2022    4:58 AM  CBC  WBC 4.0 - 10.5 K/uL 3.6  4.0  4.2   Hemoglobin 13.0 - 17.0 g/dL 9.0  7.4  8.0   Hematocrit 39.0 - 52.0 % 28.6  22.1  23.5   Platelets 150 - 400 K/uL 29  33  41     CMP     Component Value Date/Time   NA 133 (L) 09/07/2022 0412   K 4.2 09/07/2022 0412   CL 104 09/07/2022 0412   CO2 19 (L) 09/07/2022 0412   GLUCOSE 225 (H) 09/07/2022 0412   BUN 14 09/07/2022 0412   CREATININE 0.65 09/07/2022 0412   CALCIUM 7.8 (L) 09/07/2022 0412   PROT 7.3 09/03/2022 2039   ALBUMIN 2.7 (L) 09/03/2022 2039   AST 54 (H) 09/03/2022 2039   ALT 45 (H) 09/03/2022 2039   ALKPHOS 37 (L) 09/03/2022 2039   BILITOT 5.9 (H) 09/03/2022 2039   GFRNONAA >60 09/07/2022 0412   GFRAA >60 04/14/2018 0448      Impression:  Alcohol abuse 2.    GI bleeding - Ddx includes portal hypertensive causes (varices) vs alcohol gastritis, peptic ulcer disease, etc. Cannot rule out potential lower GI source. 3. Protein calorie malnutrtion 4. DKA, improving. 5. Thrombocytopenia - too low today to proceed with endoluminal evaluation.   Plan:  Continue clears today 2.   NPO p MN 3.   Platelet transfusions. 4.  Postpone procedures today, proceed with EGD and colonoscopy tomorrow.   Thank you  T. Keith Trevionne Advani, M.D. ABIM Diplomate in Gastroenterology [facility] [phone] - Cell   

## 2022-09-07 NOTE — Progress Notes (Addendum)
Progress Note   Patient: Shaun Roberts NAT:[id number] DOB: February 27, 1966 DOA: 09/03/2022     4 DOS: the patient was seen and examined on 09/07/2022   Brief hospital course: Treylin Voet is a 57 y.o. Caucasian male with medical history significant for COPD, depression, DVT, type 2 diabetes mellitus, hypertension, dyslipidemia and alcohol abuse, who presented to the emergency room with acute onset of generalized weakness with failure to thrive.  Patient was also found to have dark brown stool which was guaiac positive. Upon arriving the hospital, patient was hypotensive with blood pressure 87/63, metabolic acidosis, with elevated anion gap, significant lactic acidosis.  Hemoglobin 6.0. Patient received IV fluid bolus, blood transfusion, and IV Protonix.  GI consult obtained. EGD/colonoscopy scheduled 4/10.  Given unit of platelets for severe thrombocytopenia preop.   Principal Problem:   GI bleeding Active Problems:   Hypotension   DKA, type 2, not at goal   AKI (acute kidney injury)   Alcohol abuse   History of DVT (deep vein thrombosis)   Alcoholic ketoacidosis   Acute blood loss anemia   Thrombocytopenia   Failure to thrive in adult   Hypophosphatemia   Pressure injury of skin   Protein-calorie malnutrition, severe   Pancytopenia   Hypomagnesemia   Assessment and Plan:  GI bleeding likely due to alcoholic gastritis. Acute blood loss anemia secondary to GI bleed. Hypotension secondary to acute blood loss anemia.  Patient received IV fluids, blood transfusion, hemoglobin is better. Upper GI bleed most likely due to alcoholic gastritis, being seen by GI, continued on Protonix.  Iron level is adequate,  B12 elevated Right upper ultrasound showed steatohepatitis. Patient is scheduled for EGD/colonoscopy tomorrow, give a unit of platelets transfusion today for procedure. Hemoglobin much better today.  Orthostatic hypotension. Patient still has significant hypotension, appears to have  some chronic component.  On midodrine, I increased the dosage to 15 mg 3 times a day.  Cortisol level relatively low, patient may have a relative adrenal sufficiency, received a dose of hydrocortisone at 100 mg x 1, continued with Florinef 0.1 mg daily. Blood pressure seem to be better today.  DKA, type 2, not at goal AKI (acute kidney injury) Alcohol ketoacidosis. Hypophosphatemia. Hypomagnesemia. Conditions are improved.  Continue sodium bicarbonate for metabolic acidosis. Continue insulin glargine and sliding scale insulin.  Do not adjust dose today due to liquid diet.   Severe protein calorie malnutrition secondary to alcohol drinking. Patient had a significant muscle atrophy, start Glucerna.   Pancytopenia with anemia and severe thrombocytopenia. Anemia as above, thrombocytopenia appears to be secondary to alcohol abuse. Due to scheduled GI procedure, will transfuse 1 unit of platelets.   Alcohol abuse No alcohol withdrawal symptoms.  History of DVT (deep vein thrombosis) Eliquis on hold due to concern of GI bleed.   Generalized weakness. Seen by PT/OT, recommended nursing placement.        Subjective:  Patient condition much improved, no complaints today.  Physical Exam: Vitals:   09/06/22 1617 09/06/22 2049 09/07/22 0341 09/07/22 0813  BP: 92/68 110/71 102/68 92/66  Pulse: 90 86 95 92  Resp:  20 20 18   Temp:  98.1 F (36.7 C) 98.1 F (36.7 C) 98.2 F (36.8 C)  TempSrc:  Oral Oral Oral  SpO2: 99% 100% 93% 100%  Weight:      Height:       General exam: Appears calm and comfortable  Respiratory system: Clear to auscultation. Respiratory effort normal. Cardiovascular system: S1 & S2 heard, RRR. No  JVD, murmurs, rubs, gallops or clicks. No pedal edema. Gastrointestinal system: Abdomen is nondistended, soft and nontender. No organomegaly or masses felt. Normal bowel sounds heard. Central nervous system: Alert and oriented x3. No focal neurological  deficits. Extremities: Symmetric 5 x 5 power. Skin: No rashes, lesions or ulcers Psychiatry: Judgement and insight appear normal. Mood & affect appropriate.    Data Reviewed:  Lab results reviewed today.  Family Communication: None  Disposition: Status is: Inpatient Remains inpatient appropriate because: Severity of disease, IV treatment.     Time spent: 55 minutes  Author: Marrion Coy, MD 09/07/2022 10:48 AM  For on call review [email or web address].

## 2022-09-07 NOTE — H&P (View-Only) (Signed)
[facility] GI inpatient brief Progress Note  Patient with low platelet count today (29k). No obvious recurrent bleeding.  Vitals:   09/07/22 1106 09/07/22 1138  BP: 96/65 108/67  Pulse: 91 93  Resp: 18 18  Temp: 97.8 F (36.6 C) 97.8 F (36.6 C)  SpO2: 100% 100%       Labs:    Latest Ref Rng & Units 09/07/2022    4:12 AM 09/06/2022    3:32 AM 09/05/2022    4:58 AM  CBC  WBC 4.0 - 10.5 K/uL 3.6  4.0  4.2   Hemoglobin 13.0 - 17.0 g/dL 9.0  7.4  8.0   Hematocrit 39.0 - 52.0 % 28.6  22.1  23.5   Platelets 150 - 400 K/uL 29  33  41     CMP     Component Value Date/Time   NA 133 (L) 09/07/2022 0412   K 4.2 09/07/2022 0412   CL 104 09/07/2022 0412   CO2 19 (L) 09/07/2022 0412   GLUCOSE 225 (H) 09/07/2022 0412   BUN 14 09/07/2022 0412   CREATININE 0.65 09/07/2022 0412   CALCIUM 7.8 (L) 09/07/2022 0412   PROT 7.3 09/03/2022 2039   ALBUMIN 2.7 (L) 09/03/2022 2039   AST 54 (H) 09/03/2022 2039   ALT 45 (H) 09/03/2022 2039   ALKPHOS 37 (L) 09/03/2022 2039   BILITOT 5.9 (H) 09/03/2022 2039   GFRNONAA >60 09/07/2022 0412   GFRAA >60 04/14/2018 0448      Impression:  Alcohol abuse 2.    GI bleeding - Ddx includes portal hypertensive causes (varices) vs alcohol gastritis, peptic ulcer disease, etc. Cannot rule out potential lower GI source. 3. Protein calorie malnutrtion 4. DKA, improving. 5. Thrombocytopenia - too low today to proceed with endoluminal evaluation.   Plan:  Continue clears today 2.   NPO p MN 3.   Platelet transfusions. 4.  Postpone procedures today, proceed with EGD and colonoscopy tomorrow.   Thank you  T. Bing Plume, M.D. ABIM Diplomate in Gastroenterology [facility] [phone] - Cell

## 2022-09-07 NOTE — Discharge Instructions (Signed)
                  Intensive Outpatient Programs  [facility]    [facility] [address]     [address] [place],  [place]     [place], [place] [phone]      [phone]  [facility]   [facility]  (Inpatient and outpatient)  [phone] (Suboxone and Methadone) [address]           [phone]           [facility]    [facility] - Intensive Outpatient [address]     [address] [place], [place] [address]     [place], [place]  [phone]      [phone]  [facility] (Outpatient, Inpatient, Chemical  [facility] (Groups and Residental) (insurance only) [phone]    [place], [place]          [phone]       [facility]    [facility] (for caregivers and family) [address]     [address] [place], [place]     [place], [place] [phone]      [phone]  Residential Treatment Programs  [facility]  [facility](2 years) Residential: 90 days)  [facility] [address]      [address] [place], [place]     [place], [place] [phone]      [phone] or [phone]  [facility]    [facility] [address]      [address] [place], [place]     [place], [place] [phone]      [phone]  [facility]   [facility] ([facility]) [address]     [address] [place], [place] [address]     [place], [place] [phone]      [phone] Admissions: 8am-3pm M-F  [facility]: Residential Program (90 Days)              [facility]: [facility]  [place], [place]     [place],   [phone] or [phone]    (Walk in Hours over the weekend or by referral)   [facility]: [facility]:[phone] (for crisis  response 24 hours a day) 

## 2022-09-08 ENCOUNTER — Encounter: Payer: Self-pay | Admitting: Internal Medicine

## 2022-09-08 ENCOUNTER — Inpatient Hospital Stay: Payer: Medicare Other | Admitting: Anesthesiology

## 2022-09-08 ENCOUNTER — Encounter
Admission: EM | Disposition: A | Discharge status: Skilled Nursing Facility | Payer: Self-pay | Source: Home / Self Care | Attending: Internal Medicine

## 2022-09-08 DIAGNOSIS — K922 Gastrointestinal hemorrhage, unspecified: Secondary | ICD-10-CM | POA: Diagnosis not present

## 2022-09-08 DIAGNOSIS — D61818 Other pancytopenia: Secondary | ICD-10-CM | POA: Diagnosis not present

## 2022-09-08 DIAGNOSIS — Z86718 Personal history of other venous thrombosis and embolism: Secondary | ICD-10-CM

## 2022-09-08 DIAGNOSIS — E43 Unspecified severe protein-calorie malnutrition: Secondary | ICD-10-CM

## 2022-09-08 DIAGNOSIS — N179 Acute kidney failure, unspecified: Secondary | ICD-10-CM | POA: Diagnosis not present

## 2022-09-08 DIAGNOSIS — E871 Hypo-osmolality and hyponatremia: Secondary | ICD-10-CM

## 2022-09-08 DIAGNOSIS — E119 Type 2 diabetes mellitus without complications: Secondary | ICD-10-CM

## 2022-09-08 DIAGNOSIS — E876 Hypokalemia: Secondary | ICD-10-CM

## 2022-09-08 DIAGNOSIS — I951 Orthostatic hypotension: Secondary | ICD-10-CM

## 2022-09-08 DIAGNOSIS — L89612 Pressure ulcer of right heel, stage 2: Secondary | ICD-10-CM | POA: Insufficient documentation

## 2022-09-08 DIAGNOSIS — D696 Thrombocytopenia, unspecified: Secondary | ICD-10-CM

## 2022-09-08 DIAGNOSIS — Z8719 Personal history of other diseases of the digestive system: Secondary | ICD-10-CM

## 2022-09-08 DIAGNOSIS — D62 Acute posthemorrhagic anemia: Secondary | ICD-10-CM | POA: Diagnosis not present

## 2022-09-08 HISTORY — PX: ESOPHAGOGASTRODUODENOSCOPY: SHX5428

## 2022-09-08 HISTORY — PX: COLONOSCOPY: SHX5424

## 2022-09-08 LAB — PREPARE PLATELET PHERESIS: Unit division: 0

## 2022-09-08 LAB — BASIC METABOLIC PANEL
Anion gap: 5 (ref 5–15)
BUN: 13 mg/dL (ref 6–20)
CO2: 26 mmol/L (ref 22–32)
Calcium: 7.7 mg/dL — ABNORMAL LOW (ref 8.9–10.3)
Chloride: 107 mmol/L (ref 98–111)
Creatinine, Ser: 0.68 mg/dL (ref 0.61–1.24)
GFR, Estimated: >60 mL/min (ref >60)
Glucose, Bld: 109 mg/dL — ABNORMAL HIGH (ref 70–99)
Potassium: 3 mmol/L — ABNORMAL LOW (ref 3.5–5.1)
Sodium: 138 mmol/L (ref 135–145)

## 2022-09-08 LAB — CBC
HCT: 23.3 % — ABNORMAL LOW (ref 39.0–52.0)
Hemoglobin: 7.5 g/dL — ABNORMAL LOW (ref 13.0–17.0)
MCH: 31 pg (ref 26.0–34.0)
MCHC: 32.2 g/dL (ref 30.0–36.0)
MCV: 96.3 fL (ref 80.0–100.0)
Platelets: 46 10*3/uL — ABNORMAL LOW (ref 150–400)
RBC: 2.42 MIL/uL — ABNORMAL LOW (ref 4.22–5.81)
RDW: 18.6 % — ABNORMAL HIGH (ref 11.5–15.5)
WBC: 4.3 10*3/uL (ref 4.0–10.5)
nRBC: 0.7 % — ABNORMAL HIGH (ref 0.0–0.2)

## 2022-09-08 LAB — BPAM PLATELET PHERESIS
Blood Product Expiration Date: 202404112359
ISSUE DATE / TIME: 202404091114
Unit Type and Rh: 5100

## 2022-09-08 LAB — GLUCOSE, CAPILLARY
Glucose-Capillary: 88 mg/dL (ref 70–99)
Glucose-Capillary: 88 mg/dL (ref 70–99)
Glucose-Capillary: 106 mg/dL — ABNORMAL HIGH (ref 70–99)
Glucose-Capillary: 108 mg/dL — ABNORMAL HIGH (ref 70–99)
Glucose-Capillary: 230 mg/dL — ABNORMAL HIGH (ref 70–99)

## 2022-09-08 LAB — MAGNESIUM: Magnesium: 1.8 mg/dL (ref 1.7–2.4)

## 2022-09-08 LAB — CULTURE, BLOOD (ROUTINE X 2)

## 2022-09-08 SURGERY — EGD (ESOPHAGOGASTRODUODENOSCOPY)
Anesthesia: General

## 2022-09-08 MED ORDER — POLYETHYLENE GLYCOL 3350 17 GM/SCOOP PO POWD
1.0000 | Freq: Once | ORAL | Status: DC
  Filled 2022-09-08: qty 255

## 2022-09-08 MED ORDER — PROPOFOL 10 MG/ML IV BOLUS
INTRAVENOUS | Status: AC
  Filled 2022-09-08: qty 20

## 2022-09-08 MED ORDER — PROPOFOL 10 MG/ML IV BOLUS
INTRAVENOUS | Status: DC | PRN
  Administered 2022-09-08 (×3): 20 mg via INTRAVENOUS
  Administered 2022-09-08: 50 mg via INTRAVENOUS

## 2022-09-08 MED ORDER — SODIUM CHLORIDE 0.9 % IV SOLN: INTRAVENOUS | Status: DC | PRN

## 2022-09-08 MED ORDER — SODIUM CHLORIDE 0.9 % IV SOLN: INTRAVENOUS | Status: DC

## 2022-09-08 MED ORDER — LIDOCAINE HCL (PF) 2 % IJ SOLN
INTRAMUSCULAR | Status: DC | PRN
  Administered 2022-09-08: 30 mg via INTRADERMAL

## 2022-09-08 MED ORDER — PHENYLEPHRINE HCL (PRESSORS) 10 MG/ML IV SOLN
INTRAVENOUS | Status: DC | PRN
  Administered 2022-09-08: 80 ug via INTRAVENOUS
  Administered 2022-09-08: 160 ug via INTRAVENOUS
  Administered 2022-09-08: 100 ug via INTRAVENOUS

## 2022-09-08 MED ORDER — POTASSIUM CHLORIDE CRYS ER 20 MEQ PO TBCR
40.0000 meq | EXTENDED_RELEASE_TABLET | Freq: Once | ORAL | Status: AC
  Administered 2022-09-08: 40 meq via ORAL
  Filled 2022-09-08: qty 2

## 2022-09-08 NOTE — Assessment & Plan Note (Signed)
Improved with fluids.  

## 2022-09-08 NOTE — Assessment & Plan Note (Signed)
Patient received 3 units of packed red blood cells and 1 unit of platelets during the hospital course.  Hemoglobin upon discharge 8.1 and platelets 51.

## 2022-09-08 NOTE — TOC Progression Note (Signed)
Transition of Care [facility]) - Progression Note    Patient Details  Name: Shaun Roberts MRN: [id number] Date of Birth: 08/13/1965  [facility] CM/SW Contact  Chapman Fitch, RN Phone Number: 09/08/2022, 3:05 PM  Clinical Narrative:      Bed offers presented. Patient accepts bed at [facility] health care  Accepted in hub and notified Tanya at [facility]       Expected Discharge Plan and Services                                               Social Determinants of Health (SDOH) Interventions SDOH Screenings   Depression [phone]): Medium Risk (12/31/2019)  Tobacco Use: High Risk (09/08/2022)    Readmission Risk Interventions     No data to display

## 2022-09-08 NOTE — Assessment & Plan Note (Signed)
Present on admission see full description below. 

## 2022-09-08 NOTE — Transfer of Care (Signed)
Immediate Anesthesia Transfer of Care Note  Patient: Shaun Roberts  Procedure(s) Performed: ESOPHAGOGASTRODUODENOSCOPY (EGD) COLONOSCOPY  Patient Location: PACU and Endoscopy Unit  Anesthesia Type:MAC  Level of Consciousness: drowsy  Airway & Oxygen Therapy: Patient Spontanous Breathing and Patient connected to nasal cannula oxygen  Post-op Assessment: Report given to RN and Post -op Vital signs reviewed and stable  Post vital signs: Reviewed and stable  Last Vitals:  Vitals Value Taken Time  BP 102/67 09/08/22 1351  Temp    Pulse    Resp 17 09/08/22 1352  SpO2    Vitals shown include unvalidated device data.  Last Pain:  Vitals:   09/08/22 1240  TempSrc: Temporal  PainSc:          Complications: No notable events documented.

## 2022-09-08 NOTE — Assessment & Plan Note (Signed)
Secondary to alcohol.  Last sodium normal range.

## 2022-09-08 NOTE — Progress Notes (Signed)
Occupational Therapy Treatment Patient Details Name: Shaun Roberts MRN: [id number] DOB: 23-Jan-1966 Today's Date: 09/08/2022   History of present illness Pt is a 57 y/o M admitted on 09/03/22 after presenting to the ED with c/o acute onset of generalized weakness & failure to thrive. Pt have to have dark brown stool, which was guaiac positive. Pt was found to be hypotensive on arrival & Hgb of 6. Pt is being treated for GI bleeding likely 2/2 alcoholic gastritis, ABLA 2/2 GI bleed, & hypotension 2/2 ABLA. PMH: COPD, depression, DVT, DM2, HTN, dyslipidemia, alcohol abuse   OT comments  Pt received semi-reclined in bed; NPO for pending colonoscopy. Appearing alert; willing to work with OT on sitting EOB, grooming, and standing. T/f supervision to EOB; stood with [facility] raised CGA x2 with gait belt and RW (first stand from low bed MOD A x2 and poor balance). See flowsheet below for further details of session. Left semi-reclined with all needs in reach.  Pt mobility improved since last session; continues to need further rehab, but showing good progress. Patient will benefit from continued OT while in acute care.    Recommendations for follow up therapy are one component of a multi-disciplinary discharge planning process, led by the attending physician.  Recommendations may be updated based on patient status, additional functional criteria and insurance authorization.    Assistance Recommended at Discharge Frequent or constant Supervision/Assistance  Patient can return home with the following  Two people to help with walking and/or transfers;A lot of help with bathing/dressing/bathroom;Assistance with cooking/housework;Direct supervision/assist for medications management;Direct supervision/assist for financial management;Assist for transportation;Help with stairs or ramp for entrance   Equipment Recommendations  Other (comment) (defer to next venue of care)    Recommendations for Other Services       Precautions / Restrictions Precautions Precautions: Fall Precaution Comments: currently NPO for procedure Restrictions Weight Bearing Restrictions: No       Mobility Bed Mobility Overal bed mobility: Needs Assistance Bed Mobility: Supine to Sit, Sit to Supine     Supine to sit: Supervision Sit to supine: Supervision        Transfers Overall transfer level: Needs assistance Equipment used: Rolling walker (2 wheels) Transfers: Sit to/from Stand Sit to Stand: Mod assist, +2 physical assistance           General transfer comment: first stand from low bed MOD A x2; with bed raised, CGA x2 with gait belt.     Balance                                           ADL either performed or assessed with clinical judgement   ADL Overall ADL's : Needs assistance/impaired     Grooming: Brushing hair;Minimal assistance;Wash/dry face;Set up;Sitting Grooming Details (indicate cue type and reason): EOB today; good sitting balance. Able to comb hair for approx 2 minutes, then OT assisting for finishing the task due to UE fatigue.                               General ADL Comments: Of note, pt is NPO today awaiting procedure. Asks for water multiple times during session; does accept without argument the answer that he needs to be NPO for procedure.    Extremity/Trunk Assessment Upper Extremity Assessment Upper Extremity Assessment: Generalized weakness (slight tremor in UE noted)  Lower Extremity Assessment Lower Extremity Assessment: Generalized weakness;Defer to PT evaluation        Vision       Perception     Praxis      Cognition Arousal/Alertness: Awake/alert Behavior During Therapy: [facility] for tasks assessed/performed Overall Cognitive Status: No family/caregiver present to determine baseline cognitive functioning                                 General Comments: Pt with poor memory; doesn't recall prior therapy  session. Follows commands well.        Exercises      Shoulder Instructions       General Comments Able to take sidesteps approx 2 feet to the L, then fatigued. After seated rest break, T/f to standing CGAx2 again and sidestepped 2 more feet towards the [facility]; cues for reaching back for rails.    Pertinent Vitals/ Pain       Pain Assessment Pain Assessment: No/denies pain  Home Living                                          Prior Functioning/Environment              Frequency  Min 2X/week        Progress Toward Goals  OT Goals(current goals can now be found in the care plan section)  Progress towards OT goals: Progressing toward goals  Acute Rehab OT Goals Patient Stated Goal: Get better OT Goal Formulation: With patient Time For Goal Achievement: 09/19/22 Potential to Achieve Goals: Fair ADL Goals Pt Will Perform Grooming: with modified independence;standing Pt Will Perform Lower Body Dressing: with modified independence;sit to/from stand Pt Will Transfer to Toilet: with modified independence;ambulating;bedside commode Pt Will Perform Toileting - Clothing Manipulation and hygiene: with modified independence;sit to/from stand  Plan Discharge plan remains appropriate    Co-evaluation    PT/OT/SLP Co-Evaluation/Treatment: Yes Reason for Co-Treatment: Complexity of the patient's impairments (multi-system involvement);For patient/therapist safety;To address functional/ADL transfers   OT goals addressed during session: Strengthening/ROM;ADL's and self-care      AM-PAC OT "6 Clicks" Daily Activity     Outcome Measure   Help from another person eating meals?: None Help from another person taking care of personal grooming?: A Little Help from another person toileting, which includes using toliet, bedpan, or urinal?: A Lot Help from another person bathing (including washing, rinsing, drying)?: A Lot Help from another person to put on and  taking off regular upper body clothing?: A Lot Help from another person to put on and taking off regular lower body clothing?: A Little 6 Click Score: 16    End of Session Equipment Utilized During Treatment: Gait belt;Rolling walker (2 wheels)  OT Visit Diagnosis: Muscle weakness (generalized) (M62.81)   Activity Tolerance Patient limited by fatigue   Patient Left in bed;with bed alarm set;with call bell/phone within reach   Nurse Communication Mobility status        Time: 5638-7564 OT Time Calculation (min): 21 min  Charges: OT General Charges $OT Visit: 1 Visit OT Treatments $Self Care/Home Management : 8-22 mins  Linward Foster, MS, OTR/L   Alvester Morin 09/08/2022, 10:24 AM

## 2022-09-08 NOTE — Assessment & Plan Note (Signed)
Replaced during hospital course 

## 2022-09-08 NOTE — Assessment & Plan Note (Signed)
Secondary to gastric ulcer, portal hypertensive gastropathy and duodenal ulcer.  Continue Protonix.  Needs to avoid NSAIDs and alcohol.  Colonoscopy aborted secondary to poor prep.

## 2022-09-08 NOTE — Interval H&P Note (Signed)
History and Physical Interval Note:  09/08/2022 1:16 PM  Shaun Roberts  has presented today for surgery, with the diagnosis of anemia, heme positive stool.  The various methods of treatment have been discussed with the patient and family. After consideration of risks, benefits and other options for treatment, the patient has consented to  Procedure(s): ESOPHAGOGASTRODUODENOSCOPY (EGD) (N/A) COLONOSCOPY (N/A) as a surgical intervention.  The patient's history has been reviewed, patient examined, no change in status, stable for surgery.  I have reviewed the patient's chart and labs.  Questions were answered to the patient's satisfaction.     [place], [place]

## 2022-09-08 NOTE — Progress Notes (Signed)
Progress Note   Patient: Shaun Roberts MHD:[id number] DOB: [birth year] DOA: 09/03/2022     5 DOS: the patient was seen and examined on 09/08/2022   Brief hospital course: Shaun Roberts is a 57 y.o. Caucasian male with medical history significant for COPD, depression, DVT, type 2 diabetes mellitus, hypertension, dyslipidemia and alcohol abuse, who presented to the emergency room with acute onset of generalized weakness with failure to thrive.  Patient was also found to have dark brown stool which was guaiac positive. Upon arriving the hospital, patient was hypotensive with blood pressure 87/63, metabolic acidosis, with elevated anion gap, significant lactic acidosis.  Hemoglobin 6.0. Patient received IV fluid bolus, blood transfusion, and IV Protonix.  GI consult obtained. EGD/colonoscopy scheduled 4/10.  Given unit of platelets for severe thrombocytopenia preop.  4/10.  Hemoglobin dipped down to 7.5 will watch again tomorrow morning.  EGD showing duodenal ulcer, gastric ulcer and portal hypertensive gastropathy.  Assessment and Plan: Upper GI bleed Secondary to gastric ulcer, portal hypertensive gastropathy and duodenal ulcer.  Continue Protonix.  Needs to avoid NSAIDs and alcohol.  Colonoscopy aborted secondary to poor prep.  Acute blood loss anemia Patient received 2 units of packed red blood cells and 1 unit of platelets during the hospital course.  Pancytopenia Likely secondary to alcohol abuse.  Platelet count up to 46 after transfusion.  Hemoglobin dipped down to 7.5 will check again tomorrow morning.  White blood cell count in the normal range.  AKI (acute kidney injury) Improved with IV fluid hydration.  Alcohol abuse No current signs of withdrawal  Decubitus ulcer of right heel, stage 2 Present on admission see full description below.  Controlled type 2 diabetes mellitus without complication, without long-term current use of insulin Hemoglobin A1c low at 5.6.  Discontinue  long-acting insulin and just use sliding scale and watch.  Hypokalemia Replace orally today.  Hypomagnesemia Replaced during hospital course  Protein-calorie malnutrition, severe Continue supplements  Hypophosphatemia Replaced during hospital course  Failure to thrive in adult Will place back on diet.  Alcoholic ketoacidosis Improved with fluids.  History of DVT (deep vein thrombosis) - Given his current coagulopathy and GI bleeding we will have to hold off Eliquis.  Hyponatremia Secondary to alcohol.  Last sodium normal range.        Subjective: Patient states that he gets around enough at home but does not walk.  Patient states his last bowel movement was brown.  Had EGD today showing portal hypertensive gastropathy, gastric ulcer and duodenal ulcer.  Physical Exam: Vitals:   09/08/22 1403 09/08/22 1411 09/08/22 1421 09/08/22 1506  BP:  (!) 96/59  102/71  Pulse: 81 85  72  Resp: (!) 21   18  Temp:    98.4 F (36.9 C)  TempSrc:    Oral  SpO2: (!) 85% 95% 95% 90%  Weight:      Height:       Physical Exam HENT:     Head: Normocephalic.     Mouth/Throat:     Pharynx: No oropharyngeal exudate.  Eyes:     General: Lids are normal.     Conjunctiva/sclera: Conjunctivae normal.  Cardiovascular:     Rate and Rhythm: Normal rate and regular rhythm.     Heart sounds: Normal heart sounds, S1 normal and S2 normal.  Pulmonary:     Breath sounds: No decreased breath sounds, wheezing, rhonchi or rales.  Abdominal:     Palpations: Abdomen is soft.     Tenderness: There is  no abdominal tenderness.  Musculoskeletal:     Right lower leg: Swelling present.     Left lower leg: Swelling present.  Skin:    General: Skin is warm.     Findings: No rash.  Neurological:     Mental Status: He is alert and oriented to person, place, and time.     Data Reviewed: Hemoglobin 7.5, platelet count 46, white blood cell count 4.3, potassium 3.0, creatinine 0.68  Family  Communication: Left message for Vikki Ports  Disposition: Status is: Inpatient Remains inpatient appropriate because: PT recommending rehab but will need to speak with patient more about this.  Planned Discharge Destination: Rehab    Time spent: 28 minutes  Author: Alford Highland, MD 09/08/2022 3:21 PM  For on call review [email or web address].

## 2022-09-08 NOTE — TOC Progression Note (Signed)
Transition of Care [facility]) - Progression Note    Patient Details  Name: Neaven Daisy MRN: [id number] Date of Birth: Nov 16, 1965  Transition of Care [facility]) CM/SW Contact  Chapman Fitch, RN Phone Number: 09/08/2022, 2:15 PM  Clinical Narrative:     Patient off the floor TOC to follow up and provide bed offers         Expected Discharge Plan and Services                                               Social Determinants of Health (SDOH) Interventions SDOH Screenings   Depression (PHQ2-9): Medium Risk (12/31/2019)  Tobacco Use: High Risk (09/08/2022)    Readmission Risk Interventions     No data to display

## 2022-09-08 NOTE — Op Note (Addendum)
[facility] Gastroenterology Patient Name: Shaun PamWesley Roberts Procedure Date: 09/08/2022 1:16 PM MRN: [id number] Account #: [id number] Date of Birth: August 25, 1965 Admit Type: Inpatient Age: [age] [facility] 2 Gender: Male Note Status: Supervisor Override Instrument Name: Patton SallesUpper Endoscope [id number] Procedure:             Upper GI endoscopy Indications:           Acute post hemorrhagic anemia, Melena Providers:             Boykin Nearingeodoro K. Caton Popowski MD, MD Medicines:             Propofol per Anesthesia Complications:         No immediate complications. Procedure:             Pre-Anesthesia Assessment:                        - The risks and benefits of the procedure and the                         sedation options and risks were discussed with the                         patient. All questions were answered and informed                         consent was obtained.                        - Patient identification and proposed procedure were                         verified prior to the procedure by the nurse. The                         procedure was verified in the procedure room.                        - ASA Grade Assessment: III - A patient with severe                         systemic disease.                        - After reviewing the risks and benefits, the patient                         was deemed in satisfactory condition to undergo the                         procedure.                        After obtaining informed consent, the endoscope was                         passed under direct vision. Throughout the procedure,                         the patient's blood pressure, pulse, and oxygen  saturations were monitored continuously. The Endoscope                         was introduced through the mouth, and advanced to the                         third part of duodenum. The upper GI endoscopy was                         accomplished without  difficulty. The patient tolerated                         the procedure well. Findings:      The esophagus was normal.      Moderate portal hypertensive gastropathy was found in the entire       examined stomach. Biopsies were taken with a cold forceps for       Helicobacter pylori testing.      There is no endoscopic evidence of varices in the entire examined       stomach.      One non-bleeding cratered gastric ulcer with no stigmata of bleeding was       found in the prepyloric region of the stomach. The lesion was 12 mm in       largest dimension.      Diffuse moderately congested mucosa without active bleeding and with no       stigmata of bleeding was found in the first portion of the duodenum and       in the second portion of the duodenum.      One non-bleeding cratered duodenal ulcer with no stigmata of bleeding       was found in the duodenal bulb. The lesion was 10 mm in largest       dimension.      The third portion of the duodenum was normal.      The exam was otherwise without abnormality. Impression:            - Normal esophagus.                        - Portal hypertensive gastropathy. Biopsied.                        - Non-bleeding gastric ulcer with no stigmata of                         bleeding.                        - Congested duodenal mucosa.                        - Non-bleeding duodenal ulcer with no stigmata of                         bleeding.                        - Normal third portion of the duodenum.                        - The examination was otherwise normal. Recommendation:        -  Await pathology results.                        - Proceed with colonoscopy Procedure Code(s):     --- Professional ---                        [phone], Esophagogastroduodenoscopy, flexible,                         transoral; with biopsy, single or multiple Diagnosis Code(s):     --- Professional ---                        K92.1, Melena (includes Hematochezia)                         D62, Acute posthemorrhagic anemia                        K26.9, Duodenal ulcer, unspecified as acute or                         chronic, without hemorrhage or perforation                        K31.89, Other diseases of stomach and duodenum                        K25.9, Gastric ulcer, unspecified as acute or chronic,                         without hemorrhage or perforation                        K76.6, Portal hypertension CPT copyright 2022 American Medical Association. All rights reserved. The codes documented in this report are preliminary and upon coder review may  be revised to meet current compliance requirements. Stanton Kidney MD, MD 09/08/2022 1:42:04 PM This report has been signed electronically. Number of Addenda: 0 Note Initiated On: 09/08/2022 1:16 PM Estimated Blood Loss:  Estimated blood loss: none.      [facility]

## 2022-09-08 NOTE — Progress Notes (Signed)
Physical Therapy Treatment Patient Details Name: Shaun Roberts MRN: [id number] DOB: 1966-01-14 Today's Date: 09/08/2022   History of Present Illness Pt is a 57 y/o M admitted on 09/03/22 after presenting to the ED with c/o acute onset of generalized weakness & failure to thrive. Pt have to have dark brown stool, which was guaiac positive. Pt was found to be hypotensive on arrival & Hgb of 6. Pt is being treated for GI bleeding likely 2/2 alcoholic gastritis, ABLA 2/2 GI bleed, & hypotension 2/2 ABLA. PMH: COPD, depression, DVT, DM2, HTN, dyslipidemia, alcohol abuse    PT Comments    Pt received upright in bed agreeable to PT/OT co treat for pt/therapy safety and need for extensive physical assist in previous sessions. Pt improving in mobility today entering/exiting bed at supervision level. Performs seated ADL's with OT at supervision level. X2 STS performed at [facility] requiring modA+2 from lowered bed surface and modA+2 with bed elevated. Able to take side steps to the L towards [facility] with minA and VC's for sequencing RW with steps with good carryover. Pt fatigued with increasing tremors after second standing effort with increased WOB. Pt returns to supine iwht all needs in reach. Continue to recommend f/u PT services at discharge to address deficits from acute admission.    Recommendations for follow up therapy are one component of a multi-disciplinary discharge planning process, led by the attending physician.  Recommendations may be updated based on patient status, additional functional criteria and insurance authorization.  Follow Up Recommendations  Can patient physically be transported by private vehicle: No    Assistance Recommended at Discharge Frequent or constant Supervision/Assistance  Patient can return home with the following A lot of help with walking and/or transfers;A lot of help with bathing/dressing/bathroom;Assistance with cooking/housework;Direct supervision/assist for medications  management;Direct supervision/assist for financial management;Assist for transportation;Help with stairs or ramp for entrance   Equipment Recommendations  Other (comment) (TBD by next venue of care)    Recommendations for Other Services       Precautions / Restrictions Precautions Precautions: Fall Precaution Comments: currently NPO for procedure Restrictions Weight Bearing Restrictions: No     Mobility  Bed Mobility Overal bed mobility: Needs Assistance Bed Mobility: Supine to Sit, Sit to Supine     Supine to sit: Supervision Sit to supine: Supervision     Patient Response: Cooperative  Transfers Overall transfer level: Needs assistance Equipment used: Rolling walker (2 wheels) Transfers: Sit to/from Stand Sit to Stand: Mod assist, +2 physical assistance, Min guard           General transfer comment: first stand from low bed MOD A x2; with bed raised, CGA x2 with gait belt.    Ambulation/Gait               General Gait Details: deferred due to difficulty standing and side stepping at EOB   Stairs             Wheelchair Mobility    Modified Rankin (Stroke Patients Only)       Balance Overall balance assessment: Needs assistance Sitting-balance support: Feet supported, Bilateral upper extremity supported Sitting balance-Leahy Scale: Good     Standing balance support: Bilateral upper extremity supported, Reliant on assistive device for balance, During functional activity Standing balance-Leahy Scale: Poor Standing balance comment: heavy UE use and anterior truncal lean over RW with poor glut activation.  Cognition Arousal/Alertness: Awake/alert Behavior During Therapy: WFL for tasks assessed/performed Overall Cognitive Status: No family/caregiver present to determine baseline cognitive functioning                                 General Comments: Pt with poor memory; doesn't recall  prior therapy session. Follows commands well.        Exercises      General Comments General comments (skin integrity, edema, etc.): Able to take sidesteps approx 2 feet to the L, then fatigued. After seated rest break, T/f to standing CGAx2 again and sidestepped 2 more feet towards the [facility]; cues for reaching back for rails.      Pertinent Vitals/Pain Pain Assessment Pain Assessment: No/denies pain    Home Living                          Prior Function            PT Goals (current goals can now be found in the care plan section) Acute Rehab PT Goals Patient Stated Goal: none stated    Frequency    Min 4X/week      PT Plan Current plan remains appropriate    Co-evaluation PT/OT/SLP Co-Evaluation/Treatment: Yes Reason for Co-Treatment: Complexity of the patient's impairments (multi-system involvement);For patient/therapist safety;To address functional/ADL transfers PT goals addressed during session: Mobility/safety with mobility;Balance;Proper use of DME;Strengthening/ROM OT goals addressed during session: Strengthening/ROM;ADL's and self-care      AM-PAC PT "6 Clicks" Mobility   Outcome Measure  Help needed turning from your back to your side while in a flat bed without using bedrails?: A Little Help needed moving from lying on your back to sitting on the side of a flat bed without using bedrails?: A Little Help needed moving to and from a bed to a chair (including a wheelchair)?: A Lot Help needed standing up from a chair using your arms (e.g., wheelchair or bedside chair)?: A Lot Help needed to walk in hospital room?: Total Help needed climbing 3-5 steps with a railing? : Total 6 Click Score: 12    End of Session Equipment Utilized During Treatment: Gait belt Activity Tolerance: Patient tolerated treatment well Patient left: in bed;with call bell/phone within reach;with bed alarm set Nurse Communication: Mobility status PT Visit Diagnosis: Muscle  weakness (generalized) (M62.81);Difficulty in walking, not elsewhere classified (R26.2)     Time: 5462-7035 PT Time Calculation (min) (ACUTE ONLY): 21 min  Charges:  $Therapeutic Exercise: 8-22 mins                    Delphia Grates. Fairly IV, PT, DPT Physical Therapist-   [facility]  09/08/2022, 12:09 PM

## 2022-09-08 NOTE — Assessment & Plan Note (Signed)
Hemoglobin A1c low at 5.6.  Discontinue long-acting insulin and just use sliding scale and watch.

## 2022-09-08 NOTE — Assessment & Plan Note (Addendum)
Will place back on diet.

## 2022-09-08 NOTE — Op Note (Addendum)
[facility] Gastroenterology Patient Name: Shaun PamWesley Roberts Procedure Date: 09/08/2022 1:16 PM MRN: [id number] Account #: [id number] Date of Birth: August 01, 1965 Admit Type: Inpatient Age: 57 [facility] Gender: Male Note Status: Supervisor Override Instrument Name: Prentice DockerColonoscope [id number] Procedure:             Colonoscopy Indications:           Melena, Acute post hemorrhagic anemia Providers:             Boykin Nearingeodoro K. Danyla Wattley MD, MD Medicines:             Propofol per Anesthesia Complications:         No immediate complications. Estimated blood loss: None. Procedure:             Pre-Anesthesia Assessment:                        - The risks and benefits of the procedure and the                         sedation options and risks were discussed with the                         patient. All questions were answered and informed                         consent was obtained.                        - Patient identification and proposed procedure were                         verified prior to the procedure by the nurse. The                         procedure was verified in the procedure room.                        - ASA Grade Assessment: III - A patient with severe                         systemic disease.                        - After reviewing the risks and benefits, the patient                         was deemed in satisfactory condition to undergo the                         procedure.                        After obtaining informed consent, the colonoscope was                         passed under direct vision. Throughout the procedure,                         the patient's blood pressure, pulse, and oxygen  saturations were monitored continuously. The                         Colonoscope was introduced through the anus with the                         intention of advancing to the cecum. The scope was                         advanced to the  rectum before the procedure was                         aborted. Medications were given. The patient tolerated                         the procedure well. The quality of the bowel                         preparation was poor. The colonoscopy was aborted. Findings:      The perianal and digital rectal examinations were normal.      Procedure aborted due to poor colon preparation. Impression:            - Preparation of the colon was poor.                        - The procedure was aborted.                        - No specimens collected. Recommendation:        - Await pathology results from EGD, also performed                         today.                        - Return patient to hospital ward for ongoing care.                        - Advance diet as tolerated.                        - Continue present medications. Procedure Code(s):     --- Professional ---                        [phone], 53, Colonoscopy, flexible; diagnostic,                         including collection of specimen(s) by brushing or                         washing, when performed (separate procedure) Diagnosis Code(s):     --- Professional ---                        D62, Acute posthemorrhagic anemia                        K92.1, Melena (includes Hematochezia) CPT copyright 2022 American Medical Association. All rights reserved. The codes documented in this report  are preliminary and upon coder review may  be revised to meet current compliance requirements. Stanton Kidney MD, MD 09/08/2022 1:48:34 PM This report has been signed electronically. Number of Addenda: 0 Note Initiated On: 09/08/2022 1:16 PM Total Procedure Duration: 0 hours 1 minute 9 seconds  Estimated Blood Loss:  Estimated blood loss: none.      [facility]

## 2022-09-08 NOTE — Progress Notes (Signed)
Patient oriented to name. When asked about knowledge of these procedures in Endo he stated "going down my throat".  Patient agreed to EGD and colonoscopy. Tried to call son, left voice message without return phone call.

## 2022-09-08 NOTE — Anesthesia Postprocedure Evaluation (Signed)
Anesthesia Post Note  Patient: Shaun Roberts  Procedure(s) Performed: ESOPHAGOGASTRODUODENOSCOPY (EGD) COLONOSCOPY  Patient location during evaluation: Endoscopy Anesthesia Type: General Level of consciousness: awake and alert Pain management: pain level controlled Vital Signs Assessment: post-procedure vital signs reviewed and stable Respiratory status: spontaneous breathing, nonlabored ventilation, respiratory function stable and patient connected to nasal cannula oxygen Cardiovascular status: blood pressure returned to baseline and stable Postop Assessment: no apparent nausea or vomiting Anesthetic complications: no  No notable events documented.   Last Vitals:  Vitals:   09/08/22 1411 09/08/22 1421  BP: (!) 96/59   Pulse: 85   Resp:    Temp:    SpO2: 95% 95%    Last Pain:  Vitals:   09/08/22 1351  TempSrc: Temporal  PainSc:                  Stephanie Coup

## 2022-09-08 NOTE — Assessment & Plan Note (Addendum)
Likely secondary to alcohol abuse.  Platelet count up to 46 after transfusion.  Hemoglobin dipped down to 7.5 will check again tomorrow morning.  White blood cell count in the normal range.

## 2022-09-08 NOTE — Assessment & Plan Note (Signed)
Continue supplements

## 2022-09-08 NOTE — Assessment & Plan Note (Signed)
Replaced. °

## 2022-09-09 ENCOUNTER — Encounter: Payer: Self-pay | Admitting: Internal Medicine

## 2022-09-09 DIAGNOSIS — E119 Type 2 diabetes mellitus without complications: Secondary | ICD-10-CM

## 2022-09-09 DIAGNOSIS — N179 Acute kidney failure, unspecified: Secondary | ICD-10-CM | POA: Diagnosis not present

## 2022-09-09 DIAGNOSIS — L89612 Pressure ulcer of right heel, stage 2: Secondary | ICD-10-CM

## 2022-09-09 DIAGNOSIS — K922 Gastrointestinal hemorrhage, unspecified: Secondary | ICD-10-CM | POA: Diagnosis not present

## 2022-09-09 DIAGNOSIS — D62 Acute posthemorrhagic anemia: Secondary | ICD-10-CM | POA: Diagnosis not present

## 2022-09-09 DIAGNOSIS — D61818 Other pancytopenia: Secondary | ICD-10-CM | POA: Diagnosis not present

## 2022-09-09 LAB — CBC
HCT: 21.8 % — ABNORMAL LOW (ref 39.0–52.0)
Hemoglobin: 7.1 g/dL — ABNORMAL LOW (ref 13.0–17.0)
MCH: 31.7 pg (ref 26.0–34.0)
MCHC: 32.6 g/dL (ref 30.0–36.0)
MCV: 97.3 fL (ref 80.0–100.0)
Platelets: 51 10*3/uL — ABNORMAL LOW (ref 150–400)
RBC: 2.24 MIL/uL — ABNORMAL LOW (ref 4.22–5.81)
RDW: 19.1 % — ABNORMAL HIGH (ref 11.5–15.5)
WBC: 3.8 10*3/uL — ABNORMAL LOW (ref 4.0–10.5)
nRBC: 0 % (ref 0.0–0.2)

## 2022-09-09 LAB — TYPE AND SCREEN
ABO/RH(D): A POS
Antibody Screen: NEGATIVE

## 2022-09-09 LAB — GLUCOSE, CAPILLARY
Glucose-Capillary: 145 mg/dL — ABNORMAL HIGH (ref 70–99)
Glucose-Capillary: 161 mg/dL — ABNORMAL HIGH (ref 70–99)

## 2022-09-09 LAB — POTASSIUM: Potassium: 3.5 mmol/L (ref 3.5–5.1)

## 2022-09-09 LAB — SURGICAL PATHOLOGY

## 2022-09-09 LAB — BPAM RBC: Blood Product Expiration Date: 202404182359

## 2022-09-09 LAB — MAGNESIUM: Magnesium: 1.6 mg/dL — ABNORMAL LOW (ref 1.7–2.4)

## 2022-09-09 LAB — HEMOGLOBIN AND HEMATOCRIT, BLOOD
HCT: 24.5 % — ABNORMAL LOW (ref 39.0–52.0)
Hemoglobin: 8.1 g/dL — ABNORMAL LOW (ref 13.0–17.0)

## 2022-09-09 LAB — PREPARE RBC (CROSSMATCH)

## 2022-09-09 MED ORDER — FLUDROCORTISONE ACETATE 0.1 MG PO TABS: 0.1000 mg | ORAL_TABLET | Freq: Every day | ORAL | 0 refills | Status: DC

## 2022-09-09 MED ORDER — ACETAMINOPHEN 325 MG PO TABS: 650.0000 mg | ORAL_TABLET | Freq: Four times a day (QID) | ORAL | Status: DC | PRN

## 2022-09-09 MED ORDER — ADULT MULTIVITAMIN W/MINERALS CH: 1.0000 | ORAL_TABLET | Freq: Every day | ORAL | Status: AC

## 2022-09-09 MED ORDER — FOLIC ACID 1 MG PO TABS: 1.0000 mg | ORAL_TABLET | Freq: Every day | ORAL | 0 refills | Status: DC

## 2022-09-09 MED ORDER — ACETAMINOPHEN 325 MG PO TABS
650.0000 mg | ORAL_TABLET | Freq: Once | ORAL | Status: AC
  Administered 2022-09-09: 650 mg via ORAL
  Filled 2022-09-09: qty 2

## 2022-09-09 MED ORDER — SODIUM CHLORIDE 0.9% IV SOLUTION: Freq: Once | INTRAVENOUS | Status: AC

## 2022-09-09 MED ORDER — MAGNESIUM SULFATE 2 GM/50ML IV SOLN
2.0000 g | Freq: Once | INTRAVENOUS | Status: AC
  Administered 2022-09-09: 2 g via INTRAVENOUS
  Filled 2022-09-09: qty 50

## 2022-09-09 MED ORDER — MIDODRINE HCL 5 MG PO TABS: 15.0000 mg | ORAL_TABLET | Freq: Three times a day (TID) | ORAL | 0 refills | Status: DC

## 2022-09-09 MED ORDER — VITAMIN B-1 100 MG PO TABS: 100.0000 mg | ORAL_TABLET | Freq: Every day | ORAL | 0 refills | Status: DC

## 2022-09-09 MED ORDER — PANTOPRAZOLE SODIUM 40 MG PO TBEC: 40.0000 mg | DELAYED_RELEASE_TABLET | Freq: Two times a day (BID) | ORAL | 0 refills | Status: DC

## 2022-09-09 MED ORDER — TRAZODONE HCL 50 MG PO TABS: 25.0000 mg | ORAL_TABLET | Freq: Every evening | ORAL | 0 refills | Status: DC | PRN

## 2022-09-09 MED ORDER — FUROSEMIDE 10 MG/ML IJ SOLN
20.0000 mg | Freq: Once | INTRAMUSCULAR | Status: AC
  Administered 2022-09-09: 20 mg via INTRAVENOUS
  Filled 2022-09-09: qty 4

## 2022-09-09 MED ORDER — GLUCERNA SHAKE PO LIQD: 237.0000 mL | Freq: Two times a day (BID) | ORAL | 0 refills | Status: DC

## 2022-09-09 NOTE — Progress Notes (Signed)
Physical Therapy Treatment Patient Details Name: Shaun Roberts MRN: [id number] DOB: January 22, 1966 Today's Date: 09/09/2022   History of Present Illness Pt is a 57 y/o M admitted on 09/03/22 after presenting to the ED with c/o acute onset of generalized weakness & failure to thrive. Pt have to have dark brown stool, which was guaiac positive. Pt was found to be hypotensive on arrival & Hgb of 6. Pt is being treated for GI bleeding likely 2/2 alcoholic gastritis, ABLA 2/2 GI bleed, & hypotension 2/2 ABLA. PMH: COPD, depression, DVT, DM2, HTN, dyslipidemia, alcohol abuse    PT Comments    Pt received supine in bed agreeable to PT/OT co-treat with encouragement. Pt able to transfer to EOB at supervision level and STS minA+2 relying on bed elevated and hand over hand cues for hand placement.  [place] for SPT to recliner with RW but does rely on minA to sequence steps with RW during transfer. Upon sitting, pt does perform STS from recliner with minA+1 on rails for leverage, to place chair alarm in chair. Pt making progress towards acute goals. Will continue to progress POC as able. Continue to recommend d/c recs.    Recommendations for follow up therapy are one component of a multi-disciplinary discharge planning process, led by the attending physician.  Recommendations may be updated based on patient status, additional functional criteria and insurance authorization.  Follow Up Recommendations  Can patient physically be transported by private vehicle: No    Assistance Recommended at Discharge Frequent or constant Supervision/Assistance  Patient can return home with the following A lot of help with walking and/or transfers;A lot of help with bathing/dressing/bathroom;Assistance with cooking/housework;Direct supervision/assist for medications management;Direct supervision/assist for financial management;Assist for transportation;Help with stairs or ramp for entrance   Equipment Recommendations  Other  (comment) (TBD by next venue of care)    Recommendations for Other Services       Precautions / Restrictions Precautions Precautions: Fall Restrictions Weight Bearing Restrictions: No     Mobility  Bed Mobility Overal bed mobility: Needs Assistance Bed Mobility: Supine to Sit     Supine to sit: Supervision       Patient Response: Cooperative  Transfers Overall transfer level: Needs assistance Equipment used: Rolling walker (2 wheels) Transfers: Sit to/from Stand, Bed to chair/wheelchair/BSC Sit to Stand: Min assist, +2 physical assistance   Step pivot transfers: Min guard            Ambulation/Gait                   Stairs             Wheelchair Mobility    Modified Rankin (Stroke Patients Only)       Balance Overall balance assessment: Needs assistance Sitting-balance support: Feet supported, Bilateral upper extremity supported Sitting balance-Leahy Scale: Good     Standing balance support: Bilateral upper extremity supported, Reliant on assistive device for balance, During functional activity Standing balance-Leahy Scale: Poor Standing balance comment: heavy use of UE; trunk flexion with RW in front                            Cognition Arousal/Alertness: Awake/alert Behavior During Therapy: WFL for tasks assessed/performed Overall Cognitive Status: No family/caregiver present to determine baseline cognitive functioning  Exercises      General Comments General comments (skin integrity, edema, etc.): Pt on room air; alert and cooperative. Pt still unable to take steps forward with RW today.      Pertinent Vitals/Pain Pain Assessment Pain Assessment: No/denies pain    Home Living                          Prior Function            PT Goals (current goals can now be found in the care plan section) Acute Rehab PT Goals Patient Stated Goal: none  stated    Frequency    Min 4X/week      PT Plan Current plan remains appropriate    Co-evaluation PT/OT/SLP Co-Evaluation/Treatment: Yes Reason for Co-Treatment: Complexity of the patient's impairments (multi-system involvement);For patient/therapist safety;To address functional/ADL transfers PT goals addressed during session: Mobility/safety with mobility;Balance;Proper use of DME;Strengthening/ROM OT goals addressed during session: Proper use of Adaptive equipment and DME;Strengthening/ROM      AM-PAC PT "6 Clicks" Mobility   Outcome Measure  Help needed turning from your back to your side while in a flat bed without using bedrails?: A Little Help needed moving from lying on your back to sitting on the side of a flat bed without using bedrails?: A Little Help needed moving to and from a bed to a chair (including a wheelchair)?: A Lot Help needed standing up from a chair using your arms (e.g., wheelchair or bedside chair)?: A Lot Help needed to walk in hospital room?: A Lot Help needed climbing 3-5 steps with a railing? : Total 6 Click Score: 13    End of Session Equipment Utilized During Treatment: Gait belt Activity Tolerance: Patient tolerated treatment well Patient left: in bed;with call bell/phone within reach;with bed alarm set Nurse Communication: Mobility status PT Visit Diagnosis: Muscle weakness (generalized) (M62.81);Difficulty in walking, not elsewhere classified (R26.2)     Time: 1021-1173 PT Time Calculation (min) (ACUTE ONLY): 17 min  Charges:                       Delphia Grates. Fairly IV, PT, DPT Physical Therapist- [place]  [facility]  09/09/2022, 3:54 PM

## 2022-09-09 NOTE — Care Management Important Message (Signed)
Important Message  Patient Details  Name: Shaun Roberts MRN: [id number] Date of Birth: 1965/10/09   Medicare Important Message Given:  Yes  Patient asleep during time of visit.  Confirmed copy of Medicare IM available in room for reference.   Johnell Comings 09/09/2022, 1:33 PM

## 2022-09-09 NOTE — TOC Transition Note (Addendum)
Transition of Care [facility]) - CM/SW Discharge Note   Patient Details  Name: Naresh Trivino MRN: [id number] Date of Birth: 1965/10/28  Transition of Care [facility]) CM/SW Contact:  Chapman Fitch, RN Phone Number: 09/09/2022, 3:09 PM   Clinical Narrative:      Patient will DC to: Portal health care Anticipated DC date:09/09/22  Family notified: VM left for son Film/video editor GY:BWLSL  Per MD patient ready for DC to . RN, patient,and facility notified of DC. Discharge Summary sent to facility. RN given number for report. DC packet on chart. Ambulance transport requested for patient.  TOC signing off.  Bevelyn Ngo [facility] [phone]   Update received return call from son and updated      Patient Goals and CMS Choice      Discharge Placement                         Discharge Plan and Services Additional resources added to the After Visit Summary for                                       Social Determinants of Health (SDOH) Interventions SDOH Screenings   Depression (PHQ2-9): Medium Risk (12/31/2019)  Tobacco Use: High Risk (09/09/2022)     Readmission Risk Interventions     No data to display

## 2022-09-09 NOTE — Discharge Summary (Signed)
Physician Discharge Summary   Patient: Shaun Roberts MRN: [id number] DOB: 14-Apr-1966  Admit date:     09/03/2022  Discharge date: 09/09/22  Discharge Physician: Alford Highland   PCP: Doreene Nest, NP   Recommendations at discharge:   Follow-up with team at rehab 1 day Follow-up gastroenterology 4 weeks Recommend checking a CBC, BMP and magnesium in 7 to 10 days.  Discharge Diagnoses: Active Problems:   Upper GI bleed   Acute blood loss anemia   Orthostatic hypotension   Pancytopenia   AKI (acute kidney injury)   Alcohol abuse   Hyponatremia   History of DVT (deep vein thrombosis)   Alcoholic ketoacidosis   Thrombocytopenia   Failure to thrive in adult   Hypophosphatemia   Pressure injury of skin   Protein-calorie malnutrition, severe   Hypomagnesemia   Hypokalemia   Controlled type 2 diabetes mellitus without complication, without long-term current use of insulin   Decubitus ulcer of right heel, stage 2    Hospital Course: Shaun Roberts is a 57 y.o. Caucasian male with medical history significant for COPD, depression, DVT, type 2 diabetes mellitus, hypertension, dyslipidemia and alcohol abuse, who presented to the emergency room with acute onset of generalized weakness with failure to thrive.  Patient was also found to have dark brown stool which was guaiac positive. Upon arriving the hospital, patient was hypotensive with blood pressure 87/63, metabolic acidosis, with elevated anion gap, significant lactic acidosis.  Hemoglobin 6.0. Patient received IV fluid bolus, blood transfusion, and IV Protonix.  GI consult obtained. EGD/colonoscopy scheduled 4/10.  Given unit of platelets for severe thrombocytopenia preop.  4/10.  Hemoglobin dipped down to 7.5 will watch again tomorrow morning.  EGD showing duodenal ulcer, gastric ulcer and portal hypertensive gastropathy.  As per gastroenterology stable for disposition and will need repeat endoscopy in around 4 weeks 4/11.   Patient's hemoglobin drifted down to 7.1.  Transfuse 1 unit of packed red blood cells and hemoglobin up to 8.1 upon discharge.  Assessment and Plan: Upper GI bleed Secondary to gastric ulcer, portal hypertensive gastropathy and duodenal ulcer.  Continue Protonix twice a day for 1 month then go to once a day after that.  Needs to avoid NSAIDs and alcohol.  Colonoscopy aborted secondary to poor prep.  Acute blood loss anemia Patient received 3 units of packed red blood cells and 1 unit of platelets during the hospital course.  Hemoglobin upon discharge 8.1 and platelets 51.  Pancytopenia Likely secondary to alcohol abuse.  Last platelet count 5.1.  Last white blood cell count 3.8, last hemoglobin 8.1.  AKI (acute kidney injury) Improved with IV fluid hydration.  Alcohol abuse No current signs of withdrawal.  Patient must stop alcohol.  Decubitus ulcer of right heel, stage 2 Present on admission see full description below.  Controlled type 2 diabetes mellitus without complication, without long-term current use of insulin Hemoglobin A1c low at 5.6.    Hypokalemia Replaced  Hypomagnesemia Replaced IV today.  Protein-calorie malnutrition, severe Continue supplements  Hypophosphatemia Replaced during hospital course  Failure to thrive in adult Continue supplements  Alcoholic ketoacidosis Improved with fluids.  History of DVT (deep vein thrombosis) - Given his current coagulopathy and GI bleeding we will have to hold off Eliquis.  He was not taking this as outpatient anyway.  Hyponatremia Secondary to alcohol.  Last sodium normal range.         Consultants: Gastroenterology Procedures performed: EGD Disposition: Rehabilitation facility Diet recommendation:  Regular diet DISCHARGE MEDICATION:  Allergies as of 09/09/2022   No Known Allergies      Medication List     TAKE these medications    acetaminophen 325 MG tablet Commonly known as: TYLENOL Take 2  tablets (650 mg total) by mouth every 6 (six) hours as needed for mild pain (or Fever >/= 101).   feeding supplement (GLUCERNA SHAKE) Liqd Take 237 mLs by mouth 2 (two) times daily between meals.   fludrocortisone 0.1 MG tablet Commonly known as: FLORINEF Take 1 tablet (0.1 mg total) by mouth daily. Start taking on: September 10, 2022   folic acid 1 MG tablet Commonly known as: FOLVITE Take 1 tablet (1 mg total) by mouth daily. Start taking on: September 10, 2022   midodrine 5 MG tablet Commonly known as: PROAMATINE Take 3 tablets (15 mg total) by mouth 3 (three) times daily with meals.   multivitamin with minerals Tabs tablet Take 1 tablet by mouth daily. Start taking on: September 10, 2022   pantoprazole 40 MG tablet Commonly known as: Protonix Take 1 tablet (40 mg total) by mouth 2 (two) times daily.   thiamine 100 MG tablet Commonly known as: Vitamin B-1 Take 1 tablet (100 mg total) by mouth daily. Start taking on: September 10, 2022   traZODone 50 MG tablet Commonly known as: DESYREL Take 0.5 tablets (25 mg total) by mouth at bedtime as needed for sleep.        Contact information for follow-up providers     Stanton Kidney, MD Follow up in 4 week(s).   Specialty: Gastroenterology Why: for repeat endoscopy Contact information: [address] [place] [address] [phone]              Contact information for after-discharge care     Destination     [facility] Preferred SNF .   Service: Skilled Nursing Contact information: [address] [place] [address] [phone]                    Discharge Exam: Ceasar Mons Weights   09/03/22 2011 09/04/22 2128 09/06/22 0231  Weight: 77.5 kg 73.9 kg 77.5 kg   Physical Exam HENT:     Head: Normocephalic.     Mouth/Throat:     Pharynx: No oropharyngeal exudate.  Eyes:     General: Lids are normal.     Conjunctiva/sclera: Conjunctivae normal.  Cardiovascular:      Rate and Rhythm: Normal rate and regular rhythm.     Heart sounds: Normal heart sounds, S1 normal and S2 normal.  Pulmonary:     Breath sounds: No decreased breath sounds, wheezing, rhonchi or rales.  Abdominal:     Palpations: Abdomen is soft.     Tenderness: There is no abdominal tenderness.  Musculoskeletal:     Right lower leg: Swelling present.     Left lower leg: Swelling present.  Skin:    General: Skin is warm.     Findings: No rash.  Neurological:     Mental Status: He is alert.     Comments: Patient answers questions of her currently.      Condition at discharge: stable  The results of significant diagnostics from this hospitalization (including imaging, microbiology, ancillary and laboratory) are listed below for reference.   Imaging Studies: US Abdomen Complete  Result Date: 09/04/2022 CLINICAL DATA:  221910 Elevated LFTs 221910 141669, ETOH abuse 141669 EXAM: ABDOMEN ULTRASOUND COMPLETE COMPARISON:  None Available. FINDINGS: Gallbladder: Fine sludge within the gallbladder.  No gallstones or wall thickening visualized. No sonographic Murphy sign noted by sonographer. Common bile duct: Diameter: 5 mm. Liver: No focal lesion identified. Diffusely increased hepatic parenchymal echogenicity. Portal vein is patent on color Doppler imaging with normal direction of blood flow towards the liver. IVC: No abnormality visualized. Pancreas: Visualized portion unremarkable. Spleen: Spleen measures 12.7 cm in length.  No focal splenic lesion. Right Kidney: Length: 12.4 cm. Echogenicity within normal limits. No mass or hydronephrosis visualized. Left Kidney: Length: 13.8 cm. Echogenicity within normal limits. No mass or hydronephrosis visualized. Abdominal aorta: Not well seen, however no aneurysm visualized. Other findings: None. IMPRESSION: 1. The echogenicity of the liver is increased. This is a nonspecific finding but is most commonly seen with fatty infiltration of the liver. There are no  obvious focal liver lesions. 2. Gallbladder sludge.  No sonographic evidence of cholecystitis. 3. Borderline splenomegaly. Electronically Signed   By: Duanne GuessNicholas  Plundo D.O.   On: 09/04/2022 12:41   DG Chest Portable 1 View  Result Date: 09/03/2022 CLINICAL DATA:  Hypotension and weakness. EXAM: PORTABLE CHEST 1 VIEW COMPARISON:  PA chest 04/13/2018 FINDINGS: The heart size and mediastinal contours are within normal limits. Both lungs are clear. The visualized skeletal structures are unremarkable. IMPRESSION: No active disease. Stable chest apart from interval healing of prior acute anterior left sixth and eighth rib fractures. Electronically Signed   By: Almira BarKeith  Chesser M.D.   On: 09/03/2022 20:47    Microbiology: Results for orders placed or performed during the hospital encounter of 09/03/22  Culture, blood (routine x 2)     Status: None   Collection Time: 09/03/22  8:39 PM   Specimen: BLOOD  Result Value Ref Range Status   Specimen Description BLOOD RIGHT ARM  Final   Special Requests   Final    BOTTLES DRAWN AEROBIC AND ANAEROBIC Blood Culture adequate volume   Culture   Final    NO GROWTH 5 DAYS Performed at [facility], [address] [place], [place] [address]    Report Status 09/08/2022 FINAL  Final  Culture, blood (routine x 2)     Status: None   Collection Time: 09/03/22  8:52 PM   Specimen: BLOOD  Result Value Ref Range Status   Specimen Description BLOOD RIGHT ARM  Final   Special Requests   Final    BOTTLES DRAWN AEROBIC ONLY Blood Culture results may not be optimal due to an inadequate volume of blood received in culture bottles   Culture   Final    NO GROWTH 5 DAYS Performed at [facility], [address] [place], [place] [address]    Report Status 09/08/2022 FINAL  Final    Labs: CBC: Recent Labs  Lab 09/03/22 2039 09/04/22 0425 09/05/22 0458 09/06/22 0332 09/07/22 0412 09/08/22 0520 09/09/22 0511 09/09/22 1336  WBC 6.1   < > 4.2 4.0  3.6* 4.3 3.8*  --   NEUTROABS 4.4  --   --   --   --   --   --   --   HGB 6.6*   < > 8.0* 7.4* 9.0* 7.5* 7.1* 8.1*  HCT 20.5*   < > 23.5* 22.1* 28.6* 23.3* 21.8* 24.5*  MCV 99.0   < > 91.8 92.9 99.0 96.3 97.3  --   PLT 70*   < > 41* 33* 29* 46* 51*  --    < > = values in this interval not displayed.   Basic Metabolic Panel: Recent Labs  Lab 09/04/22 2200 09/05/22 0458 09/06/22 0332 09/07/22 0412 09/08/22 0520 09/09/22 0511  NA 141 142 136 133* 138  --   K 3.4* 3.6 3.8 4.2 3.0* 3.5  CL 113* 112* 106 104 107  --   CO2 21* 23 25 19* 26  --   GLUCOSE 134* 189* 133* 225* 109*  --   BUN 27* 26* 17 14 13   --   CREATININE 0.68 0.77 0.68 0.65 0.68  --   CALCIUM 7.8* 7.8* 7.7* 7.8* 7.7*  --   MG 1.9 1.9 1.6* 1.9 1.8 1.6*  PHOS  --  2.4* 3.1  --   --   --    Liver Function Tests: Recent Labs  Lab 09/03/22 2039  AST 54*  ALT 45*  ALKPHOS 37*  BILITOT 5.9*  PROT 7.3  ALBUMIN 2.7*   CBG: Recent Labs  Lab 09/08/22 1247 09/08/22 1629 09/08/22 2111 09/09/22 0811 09/09/22 1117  GLUCAP 106* 108* 230* 145* 161*    Discharge time spent: greater than 30 minutes.  Signed: Alford Highland, MD [facility] 09/09/2022

## 2022-09-09 NOTE — Progress Notes (Signed)
Occupational Therapy Treatment Patient Details Name: Shaun Roberts MRN: [id number] DOB: 20-Jun-1965 Today's Date: 09/09/2022   History of present illness Pt is a 57 y/o M admitted on 09/03/22 after presenting to the ED with c/o acute onset of generalized weakness & failure to thrive. Pt have to have dark brown stool, which was guaiac positive. Pt was found to be hypotensive on arrival & Hgb of 6. Pt is being treated for GI bleeding likely 2/2 alcoholic gastritis, ABLA 2/2 GI bleed, & hypotension 2/2 ABLA. PMH: COPD, depression, DVT, DM2, HTN, dyslipidemia, alcohol abuse   OT comments  Pt received semi-reclined in bed, dozing. Appearing alert once awake; willing to work with OT on transfer to chair and grooming. T/f MIN A with RW and gait belt; heavy cues for stepping sideways. See flowsheet below for further details of session. Left seated in recliner, alarm on, with all needs in reach.  Patient will benefit from continued OT while in acute care.    Recommendations for follow up therapy are one component of a multi-disciplinary discharge planning process, led by the attending physician.  Recommendations may be updated based on patient status, additional functional criteria and insurance authorization.    Assistance Recommended at Discharge Frequent or constant Supervision/Assistance  Patient can return home with the following  A lot of help with walking and/or transfers;A lot of help with bathing/dressing/bathroom;Assistance with cooking/housework;Direct supervision/assist for medications management;Direct supervision/assist for financial management;Assist for transportation;Help with stairs or ramp for entrance   Equipment Recommendations  Other (comment) (defer to next venue of care)    Recommendations for Other Services      Precautions / Restrictions Precautions Precautions: Fall Restrictions Weight Bearing Restrictions: No       Mobility Bed Mobility Overal bed mobility: Needs  Assistance Bed Mobility: Supine to Sit     Supine to sit: Supervision     General bed mobility comments: no physical assist today for bed mobility    Transfers Overall transfer level: Needs assistance Equipment used: Rolling walker (2 wheels) Transfers: Sit to/from Stand Sit to Stand: Min assist, From elevated surface (gait belt and RW)           General transfer comment: Cues for hand placement on bed or one hand on RW and one hand on bed; pt wanting to put two hands on RW initially.     Balance Overall balance assessment: Needs assistance Sitting-balance support: Feet supported, Bilateral upper extremity supported Sitting balance-Leahy Scale: Good     Standing balance support: Bilateral upper extremity supported, Reliant on assistive device for balance, During functional activity Standing balance-Leahy Scale: Poor Standing balance comment: heavy use of UE; trunk flexion with RW in front                           ADL either performed or assessed with clinical judgement   ADL   Eating/Feeding: Set up;Sitting Eating/Feeding Details (indicate cue type and reason): lunch on tray in front of him; pt able to open small containers (like salt packet) to season food as he wished without issue Grooming: Brushing hair;Sitting;Minimal assistance Grooming Details (indicate cue type and reason): at EOB; good sitting balance; needed MIN A for combing very back of hair                                    Extremity/Trunk Assessment Upper Extremity Assessment Upper  Extremity Assessment: Generalized weakness   Lower Extremity Assessment Lower Extremity Assessment: Defer to PT evaluation;Generalized weakness        Vision       Perception     Praxis      Cognition Arousal/Alertness: Awake/alert Behavior During Therapy: WFL for tasks assessed/performed Overall Cognitive Status: No family/caregiver present to determine baseline cognitive functioning                                  General Comments: Pt not remembering prior day's session. Pt not oriented to time at all (thinks it is March; Tuesday). Pt is able to remember PT's name 10 minutes after it is first given.        Exercises      Shoulder Instructions       General Comments Pt on room air; alert and cooperative. Pt still unable to take steps forward with RW today.    Pertinent Vitals/ Pain       Pain Assessment Pain Assessment: No/denies pain  Home Living                                          Prior Functioning/Environment              Frequency  Min 2X/week        Progress Toward Goals  OT Goals(current goals can now be found in the care plan section)  Progress towards OT goals: Progressing toward goals  Acute Rehab OT Goals Patient Stated Goal: Get better OT Goal Formulation: With patient Time For Goal Achievement: 09/19/22 Potential to Achieve Goals: Fair ADL Goals Pt Will Perform Grooming: with modified independence;standing Pt Will Perform Lower Body Dressing: with modified independence;sit to/from stand Pt Will Transfer to Toilet: with modified independence;ambulating;bedside commode Pt Will Perform Toileting - Clothing Manipulation and hygiene: with modified independence;sit to/from stand  Plan Discharge plan remains appropriate    Co-evaluation    PT/OT/SLP Co-Evaluation/Treatment: Yes Reason for Co-Treatment: Complexity of the patient's impairments (multi-system involvement);For patient/therapist safety;To address functional/ADL transfers   OT goals addressed during session: Proper use of Adaptive equipment and DME;Strengthening/ROM      AM-PAC OT "6 Clicks" Daily Activity     Outcome Measure   Help from another person eating meals?: None Help from another person taking care of personal grooming?: A Little Help from another person toileting, which includes using toliet, bedpan, or urinal?: A  Lot Help from another person bathing (including washing, rinsing, drying)?: A Lot Help from another person to put on and taking off regular upper body clothing?: A Lot Help from another person to put on and taking off regular lower body clothing?: A Little 6 Click Score: 16    End of Session Equipment Utilized During Treatment: Gait belt;Rolling walker (2 wheels)  OT Visit Diagnosis: Muscle weakness (generalized) (M62.81)   Activity Tolerance Patient limited by fatigue   Patient Left in chair;with call bell/phone within reach;with chair alarm set   Nurse Communication Mobility status        Time: 4132-4401 OT Time Calculation (min): 22 min  Charges: OT General Charges $OT Visit: 1 Visit OT Treatments $Self Care/Home Management : 8-22 mins  Linward Foster, MS, OTR/L   Alvester Morin 09/09/2022, 2:59 PM

## 2022-09-10 LAB — TYPE AND SCREEN: Unit division: 0

## 2022-09-10 LAB — BPAM RBC
ISSUE DATE / TIME: 202404110925
Unit Type and Rh: 6200

## 2022-10-19 ENCOUNTER — Emergency Department: Payer: Medicare Other

## 2022-10-19 ENCOUNTER — Inpatient Hospital Stay
Admission: EM | Admit: 2022-10-19 | Discharge: 2022-10-22 | DRG: 434 | Disposition: A | Discharge status: Skilled Nursing Facility | Payer: Medicare Other | Attending: Internal Medicine | Admitting: Internal Medicine

## 2022-10-19 ENCOUNTER — Observation Stay: Payer: Medicare Other

## 2022-10-19 ENCOUNTER — Other Ambulatory Visit: Payer: Self-pay

## 2022-10-19 ENCOUNTER — Encounter: Payer: Self-pay | Admitting: Emergency Medicine

## 2022-10-19 DIAGNOSIS — K7469 Other cirrhosis of liver: Secondary | ICD-10-CM

## 2022-10-19 DIAGNOSIS — Z841 Family history of disorders of kidney and ureter: Secondary | ICD-10-CM

## 2022-10-19 DIAGNOSIS — K7460 Unspecified cirrhosis of liver: Secondary | ICD-10-CM | POA: Diagnosis present

## 2022-10-19 DIAGNOSIS — Z86718 Personal history of other venous thrombosis and embolism: Secondary | ICD-10-CM | POA: Diagnosis not present

## 2022-10-19 DIAGNOSIS — I10 Essential (primary) hypertension: Secondary | ICD-10-CM | POA: Diagnosis present

## 2022-10-19 DIAGNOSIS — K7031 Alcoholic cirrhosis of liver with ascites: Principal | ICD-10-CM | POA: Diagnosis present

## 2022-10-19 DIAGNOSIS — E1165 Type 2 diabetes mellitus with hyperglycemia: Secondary | ICD-10-CM | POA: Diagnosis present

## 2022-10-19 DIAGNOSIS — E785 Hyperlipidemia, unspecified: Secondary | ICD-10-CM | POA: Diagnosis present

## 2022-10-19 DIAGNOSIS — Z813 Family history of other psychoactive substance abuse and dependence: Secondary | ICD-10-CM

## 2022-10-19 DIAGNOSIS — F17210 Nicotine dependence, cigarettes, uncomplicated: Secondary | ICD-10-CM | POA: Diagnosis present

## 2022-10-19 DIAGNOSIS — Z8249 Family history of ischemic heart disease and other diseases of the circulatory system: Secondary | ICD-10-CM

## 2022-10-19 DIAGNOSIS — Z811 Family history of alcohol abuse and dependence: Secondary | ICD-10-CM

## 2022-10-19 DIAGNOSIS — K5900 Constipation, unspecified: Secondary | ICD-10-CM | POA: Diagnosis present

## 2022-10-19 DIAGNOSIS — J449 Chronic obstructive pulmonary disease, unspecified: Secondary | ICD-10-CM | POA: Diagnosis present

## 2022-10-19 DIAGNOSIS — E8809 Other disorders of plasma-protein metabolism, not elsewhere classified: Secondary | ICD-10-CM | POA: Diagnosis present

## 2022-10-19 DIAGNOSIS — F1010 Alcohol abuse, uncomplicated: Secondary | ICD-10-CM | POA: Diagnosis not present

## 2022-10-19 DIAGNOSIS — Z818 Family history of other mental and behavioral disorders: Secondary | ICD-10-CM

## 2022-10-19 DIAGNOSIS — Z7952 Long term (current) use of systemic steroids: Secondary | ICD-10-CM

## 2022-10-19 DIAGNOSIS — Z830 Family history of human immunodeficiency virus [HIV] disease: Secondary | ICD-10-CM

## 2022-10-19 DIAGNOSIS — Z806 Family history of leukemia: Secondary | ICD-10-CM

## 2022-10-19 DIAGNOSIS — K7290 Hepatic failure, unspecified without coma: Secondary | ICD-10-CM

## 2022-10-19 DIAGNOSIS — I951 Orthostatic hypotension: Secondary | ICD-10-CM | POA: Diagnosis present

## 2022-10-19 DIAGNOSIS — Z809 Family history of malignant neoplasm, unspecified: Secondary | ICD-10-CM

## 2022-10-19 DIAGNOSIS — Z79899 Other long term (current) drug therapy: Secondary | ICD-10-CM

## 2022-10-19 DIAGNOSIS — R791 Abnormal coagulation profile: Secondary | ICD-10-CM | POA: Diagnosis present

## 2022-10-19 DIAGNOSIS — Z833 Family history of diabetes mellitus: Secondary | ICD-10-CM

## 2022-10-19 DIAGNOSIS — E876 Hypokalemia: Secondary | ICD-10-CM

## 2022-10-19 LAB — GLUCOSE, CAPILLARY
Glucose-Capillary: 101 mg/dL — ABNORMAL HIGH (ref 70–99)
Glucose-Capillary: 101 mg/dL — ABNORMAL HIGH (ref 70–99)

## 2022-10-19 LAB — PROTEIN, PLEURAL OR PERITONEAL FLUID: Total protein, fluid: <3 g/dL

## 2022-10-19 LAB — COMPREHENSIVE METABOLIC PANEL
ALT: 13 U/L (ref 0–44)
AST: 38 U/L (ref 15–41)
Albumin: 2.4 g/dL — ABNORMAL LOW (ref 3.5–5.0)
Alkaline Phosphatase: 80 U/L (ref 38–126)
Anion gap: 8 (ref 5–15)
BUN: 9 mg/dL (ref 6–20)
CO2: 27 mmol/L (ref 22–32)
Calcium: 8.4 mg/dL — ABNORMAL LOW (ref 8.9–10.3)
Chloride: 106 mmol/L (ref 98–111)
Creatinine, Ser: 0.63 mg/dL (ref 0.61–1.24)
GFR, Estimated: >60 mL/min (ref >60)
Glucose, Bld: 119 mg/dL — ABNORMAL HIGH (ref 70–99)
Potassium: 2.9 mmol/L — ABNORMAL LOW (ref 3.5–5.1)
Sodium: 141 mmol/L (ref 135–145)
Total Bilirubin: 2.6 mg/dL — ABNORMAL HIGH (ref 0.3–1.2)
Total Protein: 7.7 g/dL (ref 6.5–8.1)

## 2022-10-19 LAB — CBC
HCT: 35.3 % — ABNORMAL LOW (ref 39.0–52.0)
Hemoglobin: 11.2 g/dL — ABNORMAL LOW (ref 13.0–17.0)
MCH: 29.6 pg (ref 26.0–34.0)
MCHC: 31.7 g/dL (ref 30.0–36.0)
MCV: 93.1 fL (ref 80.0–100.0)
Platelets: 155 10*3/uL (ref 150–400)
RBC: 3.79 MIL/uL — ABNORMAL LOW (ref 4.22–5.81)
RDW: 15.1 % (ref 11.5–15.5)
WBC: 5.2 10*3/uL (ref 4.0–10.5)
nRBC: 0 % (ref 0.0–0.2)

## 2022-10-19 LAB — URINALYSIS, ROUTINE W REFLEX MICROSCOPIC
Bilirubin Urine: NEGATIVE
Glucose, UA: NEGATIVE mg/dL
Hgb urine dipstick: NEGATIVE
Ketones, ur: NEGATIVE mg/dL
Nitrite: NEGATIVE
Protein, ur: NEGATIVE mg/dL
Specific Gravity, Urine: 1.021 (ref 1.005–1.030)
pH: 8 (ref 5.0–8.0)

## 2022-10-19 LAB — PROTIME-INR
INR: 1.5 — ABNORMAL HIGH (ref 0.8–1.2)
Prothrombin Time: 18.3 seconds — ABNORMAL HIGH (ref 11.4–15.2)

## 2022-10-19 LAB — LACTATE DEHYDROGENASE, PLEURAL OR PERITONEAL FLUID: LD, Fluid: 37 U/L — ABNORMAL HIGH (ref 3–23)

## 2022-10-19 LAB — BODY FLUID CELL COUNT WITH DIFFERENTIAL
Eos, Fluid: 0 %
Lymphs, Fluid: 84 %
Monocyte-Macrophage-Serous Fluid: 13 %
Neutrophil Count, Fluid: 3 %
Total Nucleated Cell Count, Fluid: 122 cu mm

## 2022-10-19 LAB — MRSA NEXT GEN BY PCR, NASAL: MRSA by PCR Next Gen: NOT DETECTED

## 2022-10-19 LAB — GLUCOSE, PLEURAL OR PERITONEAL FLUID: Glucose, Fluid: 115 mg/dL

## 2022-10-19 LAB — LIPASE, BLOOD: Lipase: 26 U/L (ref 11–51)

## 2022-10-19 LAB — BODY FLUID CULTURE W GRAM STAIN: Gram Stain: NONE SEEN

## 2022-10-19 LAB — ALBUMIN, PLEURAL OR PERITONEAL FLUID: Albumin, Fluid: <1.5 g/dL

## 2022-10-19 MED ORDER — ONDANSETRON HCL 4 MG/2ML IJ SOLN: 4.0000 mg | Freq: Four times a day (QID) | INTRAMUSCULAR | Status: DC | PRN

## 2022-10-19 MED ORDER — GLUCERNA SHAKE PO LIQD
237.0000 mL | Freq: Two times a day (BID) | ORAL | Status: DC
  Administered 2022-10-20 – 2022-10-22 (×5): 237 mL via ORAL

## 2022-10-19 MED ORDER — ONDANSETRON HCL 4 MG PO TABS: 4.0000 mg | ORAL_TABLET | Freq: Four times a day (QID) | ORAL | Status: DC | PRN

## 2022-10-19 MED ORDER — POTASSIUM CHLORIDE 20 MEQ PO PACK
40.0000 meq | PACK | Freq: Two times a day (BID) | ORAL | Status: DC
  Administered 2022-10-19 – 2022-10-22 (×8): 40 meq via ORAL
  Filled 2022-10-19 (×8): qty 2

## 2022-10-19 MED ORDER — FUROSEMIDE 20 MG PO TABS
20.0000 mg | ORAL_TABLET | Freq: Every day | ORAL | Status: DC
  Administered 2022-10-20 – 2022-10-22 (×3): 20 mg via ORAL
  Filled 2022-10-19 (×3): qty 1

## 2022-10-19 MED ORDER — ACETAMINOPHEN 650 MG RE SUPP: 650.0000 mg | Freq: Four times a day (QID) | RECTAL | Status: DC | PRN

## 2022-10-19 MED ORDER — PANTOPRAZOLE SODIUM 40 MG PO TBEC
40.0000 mg | DELAYED_RELEASE_TABLET | Freq: Two times a day (BID) | ORAL | Status: DC
  Administered 2022-10-19 – 2022-10-22 (×7): 40 mg via ORAL
  Filled 2022-10-19 (×7): qty 1

## 2022-10-19 MED ORDER — MIRTAZAPINE 15 MG PO TBDP
15.0000 mg | ORAL_TABLET | Freq: Every day | ORAL | Status: DC
  Administered 2022-10-19 – 2022-10-22 (×4): 15 mg via ORAL
  Filled 2022-10-19 (×4): qty 1

## 2022-10-19 MED ORDER — LIDOCAINE HCL (PF) 1 % IJ SOLN
10.0000 mL | Freq: Once | INTRAMUSCULAR | Status: AC
  Administered 2022-10-19: 10 mL via INTRADERMAL

## 2022-10-19 MED ORDER — SENNOSIDES-DOCUSATE SODIUM 8.6-50 MG PO TABS
1.0000 | ORAL_TABLET | Freq: Every day | ORAL | Status: DC
  Administered 2022-10-20 – 2022-10-22 (×3): 1 via ORAL
  Filled 2022-10-19 (×3): qty 1

## 2022-10-19 MED ORDER — ALBUMIN HUMAN 25 % IV SOLN
25.0000 g | Freq: Once | INTRAVENOUS | Status: AC
  Administered 2022-10-19: 25 g via INTRAVENOUS
  Filled 2022-10-19: qty 100

## 2022-10-19 MED ORDER — THIAMINE MONONITRATE 100 MG PO TABS
100.0000 mg | ORAL_TABLET | Freq: Every day | ORAL | Status: DC
  Administered 2022-10-19 – 2022-10-22 (×4): 100 mg via ORAL
  Filled 2022-10-19 (×5): qty 1

## 2022-10-19 MED ORDER — INSULIN ASPART 100 UNIT/ML IJ SOLN: 0.0000 [IU] | Freq: Three times a day (TID) | INTRAMUSCULAR | Status: DC

## 2022-10-19 MED ORDER — ACETAMINOPHEN 325 MG PO TABS
650.0000 mg | ORAL_TABLET | Freq: Four times a day (QID) | ORAL | Status: DC | PRN
  Administered 2022-10-21 – 2022-10-22 (×2): 650 mg via ORAL
  Filled 2022-10-19 (×2): qty 2

## 2022-10-19 MED ORDER — FOLIC ACID 1 MG PO TABS
1.0000 mg | ORAL_TABLET | Freq: Every day | ORAL | Status: DC
  Administered 2022-10-19 – 2022-10-22 (×4): 1 mg via ORAL
  Filled 2022-10-19 (×4): qty 1

## 2022-10-19 MED ORDER — FOLIC ACID 1 MG PO TABS: 1.0000 mg | ORAL_TABLET | Freq: Every day | ORAL | Status: DC

## 2022-10-19 MED ORDER — SPIRONOLACTONE 25 MG PO TABS
50.0000 mg | ORAL_TABLET | Freq: Every day | ORAL | Status: DC
  Administered 2022-10-20 – 2022-10-22 (×3): 50 mg via ORAL
  Filled 2022-10-19 (×3): qty 2

## 2022-10-19 MED ORDER — SODIUM CHLORIDE 0.9% FLUSH
3.0000 mL | Freq: Two times a day (BID) | INTRAVENOUS | Status: DC
  Administered 2022-10-19 – 2022-10-22 (×6): 3 mL via INTRAVENOUS

## 2022-10-19 MED ORDER — LACTULOSE 10 GM/15ML PO SOLN: 20.0000 g | Freq: Every day | ORAL | Status: DC | PRN

## 2022-10-19 MED ORDER — THIAMINE MONONITRATE 100 MG PO TABS: 100.0000 mg | ORAL_TABLET | Freq: Every day | ORAL | Status: DC

## 2022-10-19 MED ORDER — LORAZEPAM 1 MG PO TABS: 1.0000 mg | ORAL_TABLET | Freq: Once | ORAL | Status: DC | PRN

## 2022-10-19 MED ORDER — ADULT MULTIVITAMIN W/MINERALS CH
1.0000 | ORAL_TABLET | Freq: Every day | ORAL | Status: DC
  Administered 2022-10-19 – 2022-10-22 (×4): 1 via ORAL
  Filled 2022-10-19 (×4): qty 1

## 2022-10-19 MED ORDER — FLUDROCORTISONE ACETATE 0.1 MG PO TABS
0.1000 mg | ORAL_TABLET | Freq: Every day | ORAL | Status: DC
  Administered 2022-10-20 – 2022-10-22 (×3): 0.1 mg via ORAL
  Filled 2022-10-19 (×3): qty 1

## 2022-10-19 MED ORDER — IOHEXOL 300 MG/ML  SOLN
100.0000 mL | Freq: Once | INTRAMUSCULAR | Status: AC | PRN
  Administered 2022-10-19: 100 mL via INTRAVENOUS

## 2022-10-19 MED ORDER — MIDODRINE HCL 5 MG PO TABS
15.0000 mg | ORAL_TABLET | Freq: Three times a day (TID) | ORAL | Status: DC
  Administered 2022-10-20 – 2022-10-22 (×9): 15 mg via ORAL
  Filled 2022-10-19 (×9): qty 3

## 2022-10-19 MED ORDER — SODIUM CHLORIDE 0.9 % IV SOLN: 2.0000 g | INTRAVENOUS | Status: DC

## 2022-10-19 NOTE — ED Triage Notes (Signed)
First Nurse Note;  Pt via EMS from [facility]. Pt's abd is distention. Pt c/o pain in his abd since Thursday. Pt also having lower back pain. EMS repor 153/93 BP, 106 HR, 100% on R

## 2022-10-19 NOTE — ED Provider Notes (Signed)
[facility] Provider Note    Event Date/Time   First MD Initiated Contact with Patient 10/19/22 1155     (approximate)   History   Abdominal Pain and Shortness of Breath   HPI  Shaun Roberts is a 57 y.o. male with a past medical history of DVT, alcohol abuse, diabetes, COPD, hypertension, hyperlipidemia who presents for evaluation of abdominal pain and distention.  Patient reports that this has been increasing over the past several weeks.  He has not had a fever.  He reports that he drinks a case of alcohol per week but reports that he has not had anything to drink in 6 to 8 weeks.  He denies nausea or vomiting.  He denies history of alcohol withdrawal.  He denies taking anticoagulation.  Patient Active Problem List   Diagnosis Date Noted   Decompensated hepatic cirrhosis (HCC) 10/19/2022   Constipation 10/19/2022   Upper GI bleed 09/08/2022   Orthostatic hypotension 09/08/2022   Hypokalemia 09/08/2022   Controlled type 2 diabetes mellitus without complication, without long-term current use of insulin (HCC) 09/08/2022   Decubitus ulcer of right heel, stage 2 (HCC) 09/08/2022   Pancytopenia (HCC) 09/06/2022   Hypomagnesemia 09/06/2022   Hypophosphatemia 09/05/2022   Pressure injury of skin 09/05/2022   Protein-calorie malnutrition, severe (HCC) 09/05/2022   Alcohol abuse 09/04/2022   AKI (acute kidney injury) (HCC) 09/04/2022   History of DVT (deep vein thrombosis) 09/04/2022   Alcoholic ketoacidosis 09/04/2022   Acute blood loss anemia 09/04/2022   Thrombocytopenia (HCC) 09/04/2022   Failure to thrive in adult 09/04/2022   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   Vitamin D deficiency 08/11/2021   Acute deep vein thrombosis (DVT) of right popliteal vein (HCC) 08/11/2021   Hyponatremia 08/11/2021   Moderate episode of recurrent major depressive disorder (HCC) 12/31/2019   Insomnia 06/05/2018   Special screening for malignant neoplasms, colon    Benign  neoplasm of transverse colon    Polyp of sigmoid colon    Preventative health care 05/10/2018   Tobacco abuse 05/10/2018   Trigger finger 05/04/2018   Alcohol dependence in early full remission (HCC) 05/04/2018   Rib fractures 04/13/2018   COPD (chronic obstructive pulmonary disease) (HCC) 04/13/2018   Controlled type 2 diabetes mellitus with hyperglycemia (HCC) 04/13/2018          Physical Exam   Triage Vital Signs: ED Triage Vitals [10/19/22 1049]  Enc Vitals Group     BP (!) 129/116     Pulse Rate (!) 109     Resp 18     Temp 97.7 F (36.5 C)     Temp Source Oral     SpO2 100 %     Weight 170 lb 13.7 oz (77.5 kg)     Height 5\' 11"  (1.803 m)     Head Circumference      Peak Flow      Pain Score 5     Pain Loc      Pain Edu?      Excl. in GC?     Most recent vital signs: Vitals:   10/19/22 1558 10/19/22 1700  BP: (!) 129/98 (!) 126/95  Pulse: (!) 138 (!) 106  Resp:  15  Temp:  98.1 F (36.7 C)  SpO2: 100% 100%    Physical Exam Vitals and nursing note reviewed.  Constitutional:      General: Awake and alert. No acute distress.    Appearance: Normal appearance. The patient  is normal weight with a markedly distended abdomen.  HENT:     Head: Normocephalic and atraumatic.     Mouth: Mucous membranes are moist. No tongue fasciculations  Eyes:     General: PERRL. Normal EOMs        Right eye: No discharge.        Left eye: No discharge.     Conjunctiva/sclera: Conjunctivae normal.  Cardiovascular:     Rate and Rhythm: Tachycardic rate and regular rhythm.     Pulses: Normal pulses.  Pulmonary:     Effort: Pulmonary effort is normal. No respiratory distress.     Breath sounds: Normal breath sounds.  Abdominal:     Abdomen is soft. There is no abdominal tenderness. No rebound or guarding. Marked distention noted.  Caput [place] present Musculoskeletal:        General: No swelling. Normal range of motion.     Cervical back: Normal range of motion and neck  supple.  1+ pitting edema in bilateral lower extremity No asterixis though faint tremors noted Skin:    General: Skin is warm and dry.     Capillary Refill: Capillary refill takes less than 2 seconds.     Findings: No rash.  Neurological:     Mental Status: The patient is awake and alert.      ED Results / Procedures / Treatments   Labs (all labs ordered are listed, but only abnormal results are displayed) Labs Reviewed  COMPREHENSIVE METABOLIC PANEL - Abnormal; Notable for the following components:      Result Value   Potassium 2.9 (*)    Glucose, Bld 119 (*)    Calcium 8.4 (*)    Albumin 2.4 (*)    Total Bilirubin 2.6 (*)    All other components within normal limits  CBC - Abnormal; Notable for the following components:   RBC 3.79 (*)    Hemoglobin 11.2 (*)    HCT 35.3 (*)    All other components within normal limits  URINALYSIS, ROUTINE W REFLEX MICROSCOPIC - Abnormal; Notable for the following components:   Color, Urine YELLOW (*)    APPearance HAZY (*)    Leukocytes,Ua SMALL (*)    Bacteria, UA RARE (*)    All other components within normal limits  PROTIME-INR - Abnormal; Notable for the following components:   Prothrombin Time 18.3 (*)    INR 1.5 (*)    All other components within normal limits  LACTATE DEHYDROGENASE, PLEURAL OR PERITONEAL FLUID - Abnormal; Notable for the following components:   LD, Fluid 37 (*)    All other components within normal limits  BODY FLUID CELL COUNT WITH DIFFERENTIAL - Abnormal; Notable for the following components:   Color, Fluid YELLOW (*)    Appearance, Fluid HAZY (*)    All other components within normal limits  GLUCOSE, CAPILLARY - Abnormal; Notable for the following components:   Glucose-Capillary 101 (*)    All other components within normal limits  AEROBIC CULTURE W GRAM STAIN (SUPERFICIAL SPECIMEN)  BODY FLUID CULTURE W GRAM STAIN  MRSA NEXT GEN BY PCR, NASAL  LIPASE, BLOOD  ALBUMIN, PLEURAL OR PERITONEAL FLUID    PROTEIN, PLEURAL OR PERITONEAL FLUID  GLUCOSE, PLEURAL OR PERITONEAL FLUID  LIPASE, FLUID  CBC  COMPREHENSIVE METABOLIC PANEL  PROTIME-INR     EKG     RADIOLOGY I independently reviewed and interpreted imaging and agree with radiologists findings.     PROCEDURES:  Critical Care performed:   Procedures  MEDICATIONS ORDERED IN ED: Medications  potassium chloride (KLOR-CON) packet 40 mEq (40 mEq Oral Given 10/19/22 1228)  LORazepam (ATIVAN) tablet 1 mg (has no administration in time range)  sodium chloride flush (NS) 0.9 % injection 3 mL (has no administration in time range)  acetaminophen (TYLENOL) tablet 650 mg (has no administration in time range)    Or  acetaminophen (TYLENOL) suppository 650 mg (has no administration in time range)  ondansetron (ZOFRAN) tablet 4 mg (has no administration in time range)    Or  ondansetron (ZOFRAN) injection 4 mg (has no administration in time range)  lactulose (CHRONULAC) 10 GM/15ML solution 20 g (has no administration in time range)  folic acid (FOLVITE) tablet 1 mg (1 mg Oral Given 10/19/22 1809)  thiamine (VITAMIN B1) tablet 100 mg (100 mg Oral Given 10/19/22 1809)  multivitamin with minerals tablet 1 tablet (1 tablet Oral Given 10/19/22 1809)  insulin aspart (novoLOG) injection 0-9 Units ( Subcutaneous Not Given 10/19/22 1744)  furosemide (LASIX) tablet 20 mg (has no administration in time range)    And  spironolactone (ALDACTONE) tablet 50 mg (has no administration in time range)  iohexol (OMNIPAQUE) 300 MG/ML solution 100 mL (100 mLs Intravenous Contrast Given 10/19/22 1241)  albumin human 25 % solution 25 g (25 g Intravenous New Bag/Given 10/19/22 1825)  lidocaine (PF) (XYLOCAINE) 1 % injection 10 mL (10 mLs Intradermal Given 10/19/22 1517)     IMPRESSION / MDM / ASSESSMENT AND PLAN / ED COURSE  I reviewed the triage vital signs and the nursing notes.   Differential diagnosis includes, but is not limited to, portal  hypertension, ascites, hepatorenal syndrome, hepatocellular carcinoma, intestinal obstruction, spontaneous bacterial peritonitis, liver failure/cirrhosis.  I reviewed the patient's chart.  Patient had a recent admission in early April for an upper GI bleed.  He was hypotensive in the 80s at that time with an elevated anion gap and significant lactic acidosis.  He received blood transfusion, IV Protonix, and EGD/colonoscopy.  He was also given platelets for severe thrombocytopenia.  EGD revealed duodenal ulcer, gastric ulcer, and portal hypertensive gastropathy.  Patient presents to the emergency department awake and alert, tachycardic to 109 and hypertensive, though afebrile.  He does not appear to be confused.  He has a visibly distended abdomen that is taut.  It is nontender to palpation.  No fever, no chills, not consistent with SBP.  There is no asterixis.  He does not appear to be confused.  There are no tongue fasciculations, do not suspect alcohol withdrawal, and patient reports that he has not had anything to drink in 6 weeks.  While leading suspicion is ascites, CT scan obtained for further evaluation given first-time presentation.  CT reveals hepatic cirrhosis with portal hypertension and large volume ascites and varices.  He also has an elevated INR to 1.5 and a decreased albumin to 2.4.  No leukocytosis.  He was found to be hypokalemic and his potassium was repleted with 40 mEq of oral potassium.  Patient has never had a paracentesis before.  Given likely large volume that is required, his elevated INR, his decreased albumin, and necessity of medical management and optimization I feel this to be best performed by the interventional radiology team and subsequent admission to the hospitalist service for medical optimization.  Patient and his family are in agreement with this plan.   Patient's presentation is most consistent with acute presentation with potential threat to life or bodily  function.   Clinical Course as of 10/19/22  1610  Tue Oct 19, 2022  1346 Awaiting return call from IR [JP]    Clinical Course User Index [JP] Myelle Poteat, Herb Grays, PA-C     FINAL CLINICAL IMPRESSION(S) / ED DIAGNOSES   Final diagnoses:  Ascites due to alcoholic cirrhosis (HCC)  Hypoalbuminemia  Decompensated liver disease (HCC)  Decompensated hepatic cirrhosis (HCC)  Hypokalemia     Rx / DC Orders   ED Discharge Orders     None        Note:  This document was prepared using Dragon voice recognition software and may include unintentional dictation errors.   Keturah Shavers 10/19/22 Maureen Chatters    Shaune Pollack, MD 10/19/22 [phone]

## 2022-10-19 NOTE — Procedures (Signed)
PROCEDURE SUMMARY:  Successful image-guided paracentesis from the left lower abdomen.  Yielded 5 liters of yellow fluid.  No immediate complications.  EBL = trace. Patient tolerated well.   Specimen was sent for labs.  Please see imaging section of Epic for full dictation.   Kennieth Francois PA-C 10/19/2022 3:58 PM

## 2022-10-19 NOTE — H&P (Addendum)
History and Physical    Patient: Shaun Roberts EXB:[id number] DOB: 1965-06-10 DOA: 10/19/2022 DOS: the patient was seen and examined on 10/19/2022 PCP: Doreene Nest, NP  Patient coming from: Home  Chief Complaint:  Chief Complaint  Patient presents with   Abdominal Pain   Shortness of Breath   HPI: Shaun Roberts is a 57 y.o. male with medical history significant of alcohol use disorder, COPD, type 2 diabetes, hypertension, hyperlipidemia, who presents to the ED due to abdominal pain.  Shaun Roberts states that for the last 6 weeks approximately, he has noticed gradually worsening abdominal distention.  He notes that in the past couple days, it has gotten significantly worse.  In addition, he endorses diffuse abdominal pain that seems to start in the lower abdomen and radiates upwards.  He has also been experience significant itchiness of his abdominal skin.  He endorses chills but denies any fevers, nausea, vomiting.  He endorses severe constipation stating he is only had 1 bowel movement in the last 6 weeks despite trying to enemas.  He endorses difficulty with urination, straining he has to strain to urinate, especially once the distention started.  He endorses shortness of breath since distention started but denies any chest pain.  ED course: On arrival to the ED, patient was hypertensive at 129/116 with heart rate of 109.  He was afebrile and 97.7.  He was saturating at 100% on room air.  Initial workup notable for WBC of 5.2, hemoglobin 11.2, platelets of 155, potassium 2.9, bicarb 27, creatinine 0.63 with GFR above 60, and INR 1.5.  CT of the abdomen was obtained that demonstrated nodular hepatic surface contour concerning for cirrhosis in addition to portal hypertension, moderate-large volume ascites and upper abdominal varices.  Review of Systems: As mentioned in the history of present illness. All other systems reviewed and are negative.  Past Medical History:  Diagnosis Date    Alcohol abuse    Alcohol withdrawal (HCC) 04/13/2018   Arthritis    Colon polyps    COPD (chronic obstructive pulmonary disease) (HCC)    Depression    Diabetes mellitus without complication (HCC)    Hyperlipidemia    Hypertension    Past Surgical History:  Procedure Laterality Date   ANTERIOR CRUCIATE LIGAMENT REPAIR Right 2016   COLONOSCOPY N/A 09/08/2022   Procedure: COLONOSCOPY;  Surgeon: Toledo, Boykin Nearing, MD;  Location: [facility];  Service: Gastroenterology;  Laterality: N/A;   COLONOSCOPY WITH PROPOFOL N/A 05/18/2018   Procedure: COLONOSCOPY WITH BIOPSIES;  Surgeon: Midge Minium, MD;  Location: [facility];  Service: Endoscopy;  Laterality: N/A;   ESOPHAGOGASTRODUODENOSCOPY N/A 09/08/2022   Procedure: ESOPHAGOGASTRODUODENOSCOPY (EGD);  Surgeon: Toledo, Boykin Nearing, MD;  Location: [facility];  Service: Gastroenterology;  Laterality: N/A;   KNEE RECONSTRUCTION     MEDIAL COLLATERAL LIGAMENT AND LATERAL COLLATERAL LIGAMENT REPAIR, KNEE Right 2016   POLYPECTOMY N/A 05/18/2018   Procedure: POLYPECTOMY;  Surgeon: Midge Minium, MD;  Location: [facility];  Service: Endoscopy;  Laterality: N/A;   Social History:  reports that he has been smoking cigarettes. He has been smoking an average of 1.5 packs per day. He has never used smokeless tobacco. He reports that he does not currently use alcohol. No history on file for drug use.  No Known Allergies  Family History  Problem Relation Age of Onset   Depression Mother    Diabetes Mother    Early death Mother    Kidney disease Mother    HIV/AIDS Mother  Alcohol abuse Father    Diabetes Father    Early death Father    Leukemia Father    Depression Sister    Diabetes Sister    Drug abuse Sister    Early death Sister    Cancer Sister    Early death Paternal Grandfather    Heart attack Paternal Grandfather    Heart disease Paternal Grandfather     Prior to Admission medications   Medication Sig Start Date  End Date Taking? Authorizing Provider  acetaminophen (TYLENOL) 325 MG tablet Take 2 tablets (650 mg total) by mouth every 6 (six) hours as needed for mild pain (or Fever >/= 101). 09/09/22   Alford Highland, MD  feeding supplement, GLUCERNA SHAKE, (GLUCERNA SHAKE) LIQD Take 237 mLs by mouth 2 (two) times daily between meals. 09/09/22   Alford Highland, MD  fludrocortisone (FLORINEF) 0.1 MG tablet Take 1 tablet (0.1 mg total) by mouth daily. 09/10/22   Alford Highland, MD  folic acid (FOLVITE) 1 MG tablet Take 1 tablet (1 mg total) by mouth daily. 09/10/22   Alford Highland, MD  midodrine (PROAMATINE) 5 MG tablet Take 3 tablets (15 mg total) by mouth 3 (three) times daily with meals. 09/09/22   Alford Highland, MD  Multiple Vitamin (MULTIVITAMIN WITH MINERALS) TABS tablet Take 1 tablet by mouth daily. 09/10/22   Alford Highland, MD  pantoprazole (PROTONIX) 40 MG tablet Take 1 tablet (40 mg total) by mouth 2 (two) times daily. 09/09/22 10/09/22  Alford Highland, MD  thiamine (VITAMIN B-1) 100 MG tablet Take 1 tablet (100 mg total) by mouth daily. 09/10/22   Alford Highland, MD  traZODone (DESYREL) 50 MG tablet Take 0.5 tablets (25 mg total) by mouth at bedtime as needed for sleep. 09/09/22   Alford Highland, MD    Physical Exam: Vitals:   10/19/22 1500 10/19/22 1558 10/19/22 1700 10/19/22 1942  BP: (!) 146/89 (!) 129/98 (!) 126/95 126/88  Pulse: (!) 103 (!) 138 (!) 106 (!) 102  Resp:   15 16  Temp:   98.1 F (36.7 C) 98.1 F (36.7 C)  TempSrc:   Oral   SpO2: 100% 100% 100% 100%  Weight:      Height:       Physical Exam Vitals and nursing note reviewed.  Constitutional:      General: He is not in acute distress. HENT:     Head: Normocephalic and atraumatic.     Mouth/Throat:     Mouth: Mucous membranes are moist.     Pharynx: Oropharynx is clear.  Eyes:     General: Scleral icterus (Very subtle) present.     Extraocular Movements: Extraocular movements intact.     Pupils: Pupils are  equal, round, and reactive to light.  Cardiovascular:     Rate and Rhythm: Regular rhythm. Tachycardia present.     Comments: Left leg appears larger than right, however pitting edema is equal. Pulmonary:     Effort: Pulmonary effort is normal. No respiratory distress.     Breath sounds: No wheezing, rhonchi or rales.  Abdominal:     General: Bowel sounds are normal. There is distension.     Tenderness: There is no abdominal tenderness.     Comments: Caput medusa noted  Musculoskeletal:     Right lower leg: 1+ Pitting Edema present.     Left lower leg: 1+ Pitting Edema present.  Skin:    General: Skin is warm and dry.  Neurological:     General: No  focal deficit present.     Mental Status: He is alert and oriented to person, place, and time.     Comments: Very fine bilateral tremors but no frank asterixis  Psychiatric:        Mood and Affect: Mood normal.        Behavior: Behavior normal.    Data Reviewed: CBC with WBC 5.2, hemoglobin 11.2, MCV 93 platelets of 155 CMP with sodium of 141, potassium 2.8, bicarb 27, glucose 119, BUN 9, creatinine 0.63, albumin 2.4, AST 38, ALT 13, total bilirubin 2.6 and GFR above 60 INR elevated 1.5 Urinalysis with increased squamous epithelial cells, rare bacteria, small leukocytes.  US Venous Img Lower Unilateral Left (DVT)  Result Date: 10/19/2022 CLINICAL DATA:  Left lower extremity edema. EXAM: LEFT LOWER EXTREMITY VENOUS DOPPLER ULTRASOUND TECHNIQUE: Gray-scale sonography with graded compression, as well as color Doppler and duplex ultrasound were performed to evaluate the lower extremity deep venous systems from the level of the common femoral vein and including the common femoral, femoral, profunda femoral, popliteal and calf veins including the posterior tibial, peroneal and gastrocnemius veins when visible. The superficial great saphenous vein was also interrogated. Spectral Doppler was utilized to evaluate flow at rest and with distal  augmentation maneuvers in the common femoral, femoral and popliteal veins. COMPARISON:  None Available. FINDINGS: Contralateral Common Femoral Vein: Respiratory phasicity is normal and symmetric with the symptomatic side. No evidence of thrombus. Normal compressibility. Common Femoral Vein: No evidence of thrombus. Normal compressibility, respiratory phasicity and response to augmentation. Saphenofemoral Junction: No evidence of thrombus. Normal compressibility and flow on color Doppler imaging. Profunda Femoral Vein: No evidence of thrombus. Normal compressibility and flow on color Doppler imaging. Femoral Vein: No evidence of thrombus. Normal compressibility, respiratory phasicity and response to augmentation. Popliteal Vein: No evidence of thrombus. Normal compressibility, respiratory phasicity and response to augmentation. Calf Veins: No evidence of thrombus. Normal compressibility and flow on color Doppler imaging. Superficial Great Saphenous Vein: No evidence of thrombus. Normal compressibility. Venous Reflux:  None. Other Findings: No evidence of superficial thrombophlebitis or abnormal fluid collection. IMPRESSION: No evidence of left lower extremity deep venous thrombosis. Electronically Signed   By: Irish Lack M.D.   On: 10/19/2022 15:34   CT ABDOMEN PELVIS W CONTRAST  Result Date: 10/19/2022 CLINICAL DATA:  Abdominal pain, acute, nonlocalized abd distention EXAM: CT ABDOMEN AND PELVIS WITH CONTRAST TECHNIQUE: Multidetector CT imaging of the abdomen and pelvis was performed using the standard protocol following bolus administration of intravenous contrast. RADIATION DOSE REDUCTION: This exam was performed according to the departmental dose-optimization program which includes automated exposure control, adjustment of the mA and/or kV according to patient size and/or use of iterative reconstruction technique. CONTRAST:  OMNIPAQUE IOHEXOL 300 MG/ML  SOLN COMPARISON:  Ultrasound 09/04/2022  FINDINGS: Lower chest: Included lung bases are clear. Heart size is normal. Paraesophageal varices. Hepatobiliary: Mildly decreased attenuation of the hepatic parenchyma. Slightly nodular hepatic surface contour. No focal liver lesion identified. Layering sludge and stones within the gallbladder. Pancreas: Unremarkable. No pancreatic ductal dilatation or surrounding inflammatory changes. Spleen: Spleen measures 12 cm in length. No splenic lesion identified. Adrenals/Urinary Tract: Unremarkable adrenal glands. Kidneys enhance symmetrically without focal lesion, stone, or hydronephrosis. Ureters are nondilated. Urinary bladder is decompressed. Stomach/Bowel: Stomach is within normal limits. Appendix appears normal. Submucosal fat deposition most pronounced within the ascending and transverse colon. No evidence of bowel wall thickening, distention, or inflammatory changes. Vascular/Lymphatic: Recanalization of the umbilical vein. Patent portal vein.  Upper abdominal varices. Aortic atherosclerosis. No aneurysm. Mildly prominent upper abdominal lymph nodes including 12 mm porta hepatis and pancreaticoduodenal nodes, likely reactive. Reproductive: Prostate is unremarkable. Other: Moderate-large volume ascites.  No pneumoperitoneum. Musculoskeletal: No acute or significant osseous findings. Mild anasarca. IMPRESSION: 1. Hepatic cirrhosis with evidence of portal hypertension including moderate-large volume ascites and upper abdominal varices. 2. Cholelithiasis without evidence of acute cholecystitis. 3. Submucosal fat deposition most pronounced within the ascending and transverse colon, which can be seen in the setting of chronic inflammation. 4. Aortic atherosclerosis (ICD10-I70.0). Electronically Signed   By: Duanne Guess D.O.   On: 10/19/2022 13:02    Results are pending, will review when available.  Assessment and Plan:  * Decompensated hepatic cirrhosis (HCC) Patient is presenting with acutely decompensated  hepatic cirrhosis with large volume ascites.  No prior history of ascites.  In addition physical exam is notable for caput medusa and labs demonstrate elevated INR concerning for synthetic hepatic dysfunction.  Patient was unaware of prior cirrhosis.  Likely in the setting of alcohol use disorder, but no alcohol use in the last 2 months.  Negative workup for viral hepatitis in the past.  - Ultrasound-guided paracentesis ordered - Ativan prior to paracentesis given procedural anxiety - Albumin with paracentesis given anticipation of large-volume removal - Peritoneal cell count with culture, in addition to albumin - Pending peritoneal cell count, will cover for SBP with ceftriaxone 2 g daily - Will need to start spironolactone and Lasix prior to discharge - Outpatient follow-up with Dr. Norma Fredrickson in July; may benefit from moving this appointment up  Alcohol abuse Patient states he is no longer drinking alcohol for the last 2 months.  No evidence of withdrawal at this time.  I suspect tremors in the setting of cirrhosis versus chills.  - CIWA monitoring without Ativan - Thiamine, folic acid and multivitamin daily  History of DVT (deep vein thrombosis) History of DVT in the past, not currently on any anticoagulation.  Left calf is larger than the right, so we will check a Doppler  - Lower extremity Doppler pending  COPD (chronic obstructive pulmonary disease) (HCC) History of COPD, with shortness of breath and cough today, however most likely secondary to large volume ascites.  Pulmonary exam is reassuring.  - Continue home bronchodilators - DuoNebs as needed  Orthostatic hypotension Previous history of orthostatic hypotension treated with Florinef and midodrine.  Per nursing home medication reconciliation, patient is still taking.   -Continue home Florinef 0.1 mg daily and midodrine 15 mg 3 times daily  Controlled type 2 diabetes mellitus with hyperglycemia (HCC) - SSI,  sensitive  Constipation Potentially in the setting of large volume ascites.  Will start as needed lactulose for now and monitor for improvement after paracentesis.  -Daily lactulose as needed  Advance Care Planning:   Code Status: Full Code verified by patient.  He states that he would not want long-term life support, but is amenable to a short-term resuscitative effort  Consults: IR  Family Communication: No family at bedside  Severity of Illness: The appropriate patient status for this patient is OBSERVATION. Observation status is judged to be reasonable and necessary in order to provide the required intensity of service to ensure the patient's safety. The patient's presenting symptoms, physical exam findings, and initial radiographic and laboratory data in the context of their medical condition is felt to place them at decreased risk for further clinical deterioration. Furthermore, it is anticipated that the patient will be medically stable for discharge  from the hospital within 2 midnights of admission.   Author: Verdene Lennert, MD 10/19/2022 11:19 PM  For on call review [email or web address].

## 2022-10-19 NOTE — Assessment & Plan Note (Signed)
History of DVT in the past, not currently on any anticoagulation.  Left calf is larger than the right, so we will check a Doppler  - Lower extremity Doppler pending

## 2022-10-19 NOTE — Assessment & Plan Note (Signed)
History of COPD, with shortness of breath and cough today, however most likely secondary to large volume ascites.  Pulmonary exam is reassuring.  - Continue home bronchodilators - DuoNebs as needed

## 2022-10-19 NOTE — Assessment & Plan Note (Signed)
-   SSI, sensitive 

## 2022-10-19 NOTE — Assessment & Plan Note (Addendum)
Patient is presenting with acutely decompensated hepatic cirrhosis with large volume ascites.  No prior history of ascites.  In addition physical exam is notable for caput medusa and labs demonstrate elevated INR concerning for synthetic hepatic dysfunction.  Patient was unaware of prior cirrhosis.  Likely in the setting of alcohol use disorder, but no alcohol use in the last 2 months.  Negative workup for viral hepatitis in the past.  - Ultrasound-guided paracentesis ordered - Ativan prior to paracentesis given procedural anxiety - Albumin with paracentesis given anticipation of large-volume removal - Peritoneal cell count with culture, in addition to albumin - Pending peritoneal cell count, will cover for SBP with ceftriaxone 2 g daily - Will need to start spironolactone and Lasix prior to discharge - Outpatient follow-up with Dr. Norma Fredrickson in July; may benefit from moving this appointment up

## 2022-10-19 NOTE — ED Triage Notes (Signed)
Pt here via [facility] with abd pain and back pain. Pt has a taut and swollen abd and states it is causing him back pain and to become SOB. Pt has not had a paracentesis in the past. Pt denies NVD.

## 2022-10-19 NOTE — Assessment & Plan Note (Signed)
Patient states he is no longer drinking alcohol for the last 2 months.  No evidence of withdrawal at this time.  I suspect tremors in the setting of cirrhosis versus chills.  - CIWA monitoring without Ativan - Thiamine, folic acid and multivitamin daily

## 2022-10-19 NOTE — Assessment & Plan Note (Addendum)
Previous history of orthostatic hypotension treated with Florinef and midodrine.  Per nursing home medication reconciliation, patient is still taking.   -Continue home Florinef 0.1 mg daily and midodrine 15 mg 3 times daily

## 2022-10-19 NOTE — Assessment & Plan Note (Signed)
Potentially in the setting of large volume ascites.  Will start as needed lactulose for now and monitor for improvement after paracentesis.  -Daily lactulose as needed

## 2022-10-20 DIAGNOSIS — K7290 Hepatic failure, unspecified without coma: Secondary | ICD-10-CM

## 2022-10-20 DIAGNOSIS — Z813 Family history of other psychoactive substance abuse and dependence: Secondary | ICD-10-CM | POA: Diagnosis not present

## 2022-10-20 DIAGNOSIS — E1165 Type 2 diabetes mellitus with hyperglycemia: Secondary | ICD-10-CM | POA: Diagnosis present

## 2022-10-20 DIAGNOSIS — Z806 Family history of leukemia: Secondary | ICD-10-CM | POA: Diagnosis not present

## 2022-10-20 DIAGNOSIS — K7460 Unspecified cirrhosis of liver: Secondary | ICD-10-CM | POA: Diagnosis not present

## 2022-10-20 DIAGNOSIS — Z7952 Long term (current) use of systemic steroids: Secondary | ICD-10-CM | POA: Diagnosis not present

## 2022-10-20 DIAGNOSIS — Z841 Family history of disorders of kidney and ureter: Secondary | ICD-10-CM | POA: Diagnosis not present

## 2022-10-20 DIAGNOSIS — Z809 Family history of malignant neoplasm, unspecified: Secondary | ICD-10-CM | POA: Diagnosis not present

## 2022-10-20 DIAGNOSIS — Z818 Family history of other mental and behavioral disorders: Secondary | ICD-10-CM | POA: Diagnosis not present

## 2022-10-20 DIAGNOSIS — K5900 Constipation, unspecified: Secondary | ICD-10-CM | POA: Diagnosis present

## 2022-10-20 DIAGNOSIS — Z811 Family history of alcohol abuse and dependence: Secondary | ICD-10-CM | POA: Diagnosis not present

## 2022-10-20 DIAGNOSIS — Z833 Family history of diabetes mellitus: Secondary | ICD-10-CM | POA: Diagnosis not present

## 2022-10-20 DIAGNOSIS — F1010 Alcohol abuse, uncomplicated: Secondary | ICD-10-CM | POA: Diagnosis present

## 2022-10-20 DIAGNOSIS — I10 Essential (primary) hypertension: Secondary | ICD-10-CM | POA: Diagnosis present

## 2022-10-20 DIAGNOSIS — R791 Abnormal coagulation profile: Secondary | ICD-10-CM | POA: Diagnosis present

## 2022-10-20 DIAGNOSIS — E8809 Other disorders of plasma-protein metabolism, not elsewhere classified: Secondary | ICD-10-CM | POA: Diagnosis present

## 2022-10-20 DIAGNOSIS — Z86718 Personal history of other venous thrombosis and embolism: Secondary | ICD-10-CM | POA: Diagnosis not present

## 2022-10-20 DIAGNOSIS — F17210 Nicotine dependence, cigarettes, uncomplicated: Secondary | ICD-10-CM | POA: Diagnosis present

## 2022-10-20 DIAGNOSIS — Z79899 Other long term (current) drug therapy: Secondary | ICD-10-CM | POA: Diagnosis not present

## 2022-10-20 DIAGNOSIS — J449 Chronic obstructive pulmonary disease, unspecified: Secondary | ICD-10-CM | POA: Diagnosis present

## 2022-10-20 DIAGNOSIS — Z830 Family history of human immunodeficiency virus [HIV] disease: Secondary | ICD-10-CM | POA: Diagnosis not present

## 2022-10-20 DIAGNOSIS — E876 Hypokalemia: Secondary | ICD-10-CM | POA: Diagnosis present

## 2022-10-20 DIAGNOSIS — K7031 Alcoholic cirrhosis of liver with ascites: Secondary | ICD-10-CM | POA: Diagnosis present

## 2022-10-20 DIAGNOSIS — Z8249 Family history of ischemic heart disease and other diseases of the circulatory system: Secondary | ICD-10-CM | POA: Diagnosis not present

## 2022-10-20 DIAGNOSIS — E785 Hyperlipidemia, unspecified: Secondary | ICD-10-CM | POA: Diagnosis present

## 2022-10-20 DIAGNOSIS — I951 Orthostatic hypotension: Secondary | ICD-10-CM | POA: Diagnosis present

## 2022-10-20 LAB — COMPREHENSIVE METABOLIC PANEL
ALT: 12 U/L (ref 0–44)
AST: 29 U/L (ref 15–41)
Albumin: 2.2 g/dL — ABNORMAL LOW (ref 3.5–5.0)
Alkaline Phosphatase: 52 U/L (ref 38–126)
Anion gap: 8 (ref 5–15)
BUN: 6 mg/dL (ref 6–20)
CO2: 25 mmol/L (ref 22–32)
Calcium: 7.7 mg/dL — ABNORMAL LOW (ref 8.9–10.3)
Chloride: 106 mmol/L (ref 98–111)
Creatinine, Ser: 0.53 mg/dL — ABNORMAL LOW (ref 0.61–1.24)
GFR, Estimated: >60 mL/min (ref >60)
Glucose, Bld: 78 mg/dL (ref 70–99)
Potassium: 3.6 mmol/L (ref 3.5–5.1)
Sodium: 139 mmol/L (ref 135–145)
Total Bilirubin: 2.1 mg/dL — ABNORMAL HIGH (ref 0.3–1.2)
Total Protein: 6 g/dL — ABNORMAL LOW (ref 6.5–8.1)

## 2022-10-20 LAB — GLUCOSE, CAPILLARY
Glucose-Capillary: 74 mg/dL (ref 70–99)
Glucose-Capillary: 120 mg/dL — ABNORMAL HIGH (ref 70–99)
Glucose-Capillary: 89 mg/dL (ref 70–99)
Glucose-Capillary: 136 mg/dL — ABNORMAL HIGH (ref 70–99)

## 2022-10-20 LAB — BODY FLUID CULTURE W GRAM STAIN: Culture: NO GROWTH

## 2022-10-20 LAB — CBC
HCT: 28.2 % — ABNORMAL LOW (ref 39.0–52.0)
Hemoglobin: 9.1 g/dL — ABNORMAL LOW (ref 13.0–17.0)
MCH: 29.4 pg (ref 26.0–34.0)
MCHC: 32.3 g/dL (ref 30.0–36.0)
MCV: 91 fL (ref 80.0–100.0)
Platelets: 103 10*3/uL — ABNORMAL LOW (ref 150–400)
RBC: 3.1 MIL/uL — ABNORMAL LOW (ref 4.22–5.81)
RDW: 15.1 % (ref 11.5–15.5)
WBC: 4.3 10*3/uL (ref 4.0–10.5)
nRBC: 0 % (ref 0.0–0.2)

## 2022-10-20 LAB — PROTIME-INR
INR: 1.7 — ABNORMAL HIGH (ref 0.8–1.2)
Prothrombin Time: 19.8 seconds — ABNORMAL HIGH (ref 11.4–15.2)

## 2022-10-20 LAB — PATHOLOGIST SMEAR REVIEW

## 2022-10-20 NOTE — Progress Notes (Signed)
Progress Note   Patient: Shaun Roberts AVW:[id number] DOB: December 22, 1965 DOA: 10/19/2022     0 DOS: the patient was seen and examined on 10/20/2022     Subjective:  Patient seen and examined at bedside this morning Patient is status post paracentesis after 5 L of fluid were removed Analysis of fluid currently does not show any abnormal findings although final results is pending Denied worsening abdominal pain nausea vomiting chest pain or cough   Brief hospital course:  From HPI: By Dr. Huel Cote "Shaun Roberts is a 57 y.o. male with medical history significant of alcohol use disorder, COPD, type 2 diabetes, hypertension, hyperlipidemia, who presents to the ED due to abdominal pain.   Shaun Roberts states that for the last 6 weeks approximately, he has noticed gradually worsening abdominal distention.  He notes that in the past couple days, it has gotten significantly worse.  In addition, he endorses diffuse abdominal pain that seems to start in the lower abdomen and radiates upwards.  He has also been experience significant itchiness of his abdominal skin.  He endorses chills but denies any fevers, nausea, vomiting.  He endorses severe constipation stating he is only had 1 bowel movement in the last 6 weeks despite trying to enemas.  He endorses difficulty with urination, straining he has to strain to urinate, especially once the distention started.  He endorses shortness of breath since distention started but denies any chest pain.   ED course: On arrival to the ED, patient was hypertensive at 129/116 with heart rate of 109.  He was afebrile and 97.7.  He was saturating at 100% on room air.  Initial workup notable for WBC of 5.2, hemoglobin 11.2, platelets of 155, potassium 2.9, bicarb 27, creatinine 0.63 with GFR above 60, and INR 1.5.  CT of the abdomen was obtained that demonstrated nodular hepatic surface contour concerning for cirrhosis in addition to portal hypertension, moderate-large volume  ascites and upper abdominal varices."   Assessment and Plan:   Decompensated hepatic cirrhosis (HCC) Patient is presenting with acutely decompensated hepatic cirrhosis with large volume ascites.  No prior history of ascites. - S/p paracentesis after 5 L of ascitic fluid removed -Follow-up on peritoneal cell count with culture, in addition to albumin - Pending peritoneal cell count, will cover for SBP with ceftriaxone 2 g daily - Continue spironolactone and Lasix before discharge - Outpatient follow-up with Dr. Norma Roberts in July; may benefit from moving this appointment up   Alcohol abuse Patient states he is no longer drinking alcohol for the last 2 months.  No evidence of withdrawal at this time.  - CIWA monitoring without Ativan - Thiamine, folic acid and multivitamin daily   History of DVT (deep vein thrombosis) History of DVT in the past, not currently on any anticoagulation.   Left calf is larger than the right, Doppler did not show any clot   COPD (chronic obstructive pulmonary disease) (HCC) History of COPD, with shortness of breath and cough today, however most likely secondary to large volume ascites.  Pulmonary exam is reassuring.   - Continue home bronchodilators - DuoNebs as needed   Orthostatic hypotension Previous history of orthostatic hypotension treated with Florinef and midodrine.  Per nursing home medication reconciliation, patient is still taking.  -Continue home Florinef 0.1 mg daily and midodrine 15 mg 3 times daily   Controlled type 2 diabetes mellitus with hyperglycemia (HCC) - SSI, sensitive   Constipation Potentially in the setting of large volume ascites.  Will start as  needed lactulose for now and monitor for improvement after paracentesis.   -Daily lactulose as needed   Advance Care Planning:   Code Status: Full Code verified by patient.  He states that he would not want long-term life support, but is amenable to a short-term resuscitative effort    Consults: IR   Family Communication: No family at bedside   Physical Exam: Physical Exam Vitals and nursing note reviewed.  Constitutional:      General: He is not in acute distress. HENT:     Head: Normocephalic and atraumatic.     Mouth/Throat:     Mouth: Mucous membranes are moist.     Pharynx: Oropharynx is clear.  Eyes:     General: Scleral icterus (Very subtle) present.     Extraocular Movements: Extraocular movements intact.     Pupils: Pupils are equal, round, and reactive to light.  Cardiovascular:     Rate and Rhythm: Regular rhythm. Pulmonary:     Effort: Pulmonary effort is normal. No respiratory distress.     Breath sounds: No wheezing, rhonchi or rales.  Abdominal:     General: Bowel sounds are normal.  Mildly distended Skin:    General: Skin is warm and dry.  Neurological:     General: No focal deficit present.     Mental Status: He is alert and oriented to person, place, and time.  Psychiatric:        Mood and Affect: Mood normal.        Behavior: Behavior normal.     Vitals:   10/19/22 1942 10/20/22 0500 10/20/22 0521 10/20/22 0834  BP: 126/88  114/65 (!) 103/57  Pulse: (!) 102  (!) 104 98  Resp: 16  18 16   Temp: 98.1 F (36.7 C)  98.1 F (36.7 C) 98.3 F (36.8 C)  TempSrc:   Oral   SpO2: 100%  96%   Weight:  80.6 kg    Height:        Data Reviewed: I reviewed patient's labs sodium 139 potassium 3.6 creatinine 0.5   Time spent: 50 minutes  Author: Loyce Dys, MD 10/20/2022 3:51 PM  For on call review [email or web address].

## 2022-10-20 NOTE — TOC Initial Note (Signed)
Transition of Care [facility]) - Initial/Assessment Note    Patient Details  Name: Shaun Roberts MRN: [id number] Date of Birth: 25-Mar-1966  Transition of Care [facility]) CM/SW Contact:    Margarito Liner, LCSW Phone Number: 10/20/2022, 1:20 PM  Clinical Narrative:  CSW met with patient. No supports at bedside. CSW introduced role and explained that discharge planning would be discussed. Patient discharged to [facility] on 4/11. CSW asked patient if plan is to return there at discharge and he said he had not really thought about it. MD consulted PT and OT today to confirm patient still needs SNF. Patient is aware. CSW discussed copay days with patient. CSW spoke to admissions coordinator who stated patient will not need a 3-night inpatient stay to return.              Expected Discharge Plan:  (TBD) Barriers to Discharge: Continued Medical Work up   Patient Goals and CMS Choice     Choice offered to / list presented to : Patient      Expected Discharge Plan and Services     Post Acute Care Choice:  (TBD) Living arrangements for the past 2 months: Skilled Nursing Facility, Single Family Home                                      Prior Living Arrangements/Services Living arrangements for the past 2 months: Skilled Nursing Facility, Single Family Home Lives with:: Self Patient language and need for interpreter reviewed:: Yes Do you feel safe going back to the place where you live?: Yes      Need for Family Participation in Patient Care: Yes (Comment) Care giver support system in place?: Yes (comment)   Criminal Activity/Legal Involvement Pertinent to Current Situation/Hospitalization: No - Comment as needed  Activities of Daily Living Home Assistive Devices/Equipment: Wheelchair ADL Screening (condition at time of admission) Patient's cognitive ability adequate to safely complete daily activities?: Yes Is the patient deaf or have difficulty hearing?: No Does the  patient have difficulty seeing, even when wearing glasses/contacts?: No Does the patient have difficulty concentrating, remembering, or making decisions?: No Patient able to express need for assistance with ADLs?: Yes Does the patient have difficulty dressing or bathing?: Yes Independently performs ADLs?: No Communication: Independent Dressing (OT): Independent Grooming: Independent Feeding: Independent Bathing: Independent Toileting: Needs assistance Is this a change from baseline?: Pre-admission baseline In/Out Bed: Needs assistance Is this a change from baseline?: Pre-admission baseline Walks in Home: Dependent Is this a change from baseline?: Pre-admission baseline Does the patient have difficulty walking or climbing stairs?: Yes Weakness of Legs: Both Weakness of Arms/Hands: Both  Permission Sought/Granted Permission sought to share information with : Facility Industrial/product designer granted to share information with : Yes, Verbal Permission Granted     Permission granted to share info w AGENCY: [facility]        Emotional Assessment Appearance:: Appears stated age Attitude/Demeanor/Rapport: Engaged Affect (typically observed): Accepting, Calm Orientation: : Oriented to Self, Oriented to Place, Oriented to  Time, Oriented to Situation Alcohol / Substance Use: Not Applicable Psych Involvement: No (comment)  Admission diagnosis:  Hypoalbuminemia [E88.09] Ascites due to alcoholic cirrhosis (HCC) [K70.31] Decompensated hepatic cirrhosis (HCC) [K72.90, K74.60] Patient Active Problem List   Diagnosis Date Noted   Decompensated hepatic cirrhosis (HCC) 10/19/2022   Constipation 10/19/2022   Upper GI bleed 09/08/2022   Orthostatic hypotension  09/08/2022   Hypokalemia 09/08/2022   Controlled type 2 diabetes mellitus without complication, without long-term current use of insulin (HCC) 09/08/2022   Decubitus ulcer of right heel, stage 2 (HCC) 09/08/2022    Pancytopenia (HCC) 09/06/2022   Hypomagnesemia 09/06/2022   Hypophosphatemia 09/05/2022   Pressure injury of skin 09/05/2022   Protein-calorie malnutrition, severe (HCC) 09/05/2022   Alcohol abuse 09/04/2022   AKI (acute kidney injury) (HCC) 09/04/2022   History of DVT (deep vein thrombosis) 09/04/2022   Alcoholic ketoacidosis 09/04/2022   Acute blood loss anemia 09/04/2022   Thrombocytopenia (HCC) 09/04/2022   Failure to thrive in adult 09/04/2022   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   Vitamin D deficiency 08/11/2021   Acute deep vein thrombosis (DVT) of right popliteal vein (HCC) 08/11/2021   Hyponatremia 08/11/2021   Moderate episode of recurrent major depressive disorder (HCC) 12/31/2019   Insomnia 06/05/2018   Special screening for malignant neoplasms, colon    Benign neoplasm of transverse colon    Polyp of sigmoid colon    Preventative health care 05/10/2018   Tobacco abuse 05/10/2018   Trigger finger 05/04/2018   Alcohol dependence in early full remission (HCC) 05/04/2018   Rib fractures 04/13/2018   COPD (chronic obstructive pulmonary disease) (HCC) 04/13/2018   Controlled type 2 diabetes mellitus with hyperglycemia (HCC) 04/13/2018   PCP:  Doreene Nest, NP Pharmacy:   [facility] [phone] - [place], [place] - [address] [place] [address] Phone: [phone] Fax: [phone]     Social Determinants of Health (SDOH) Social History: SDOH Screenings   Food Insecurity: No Food Insecurity (10/19/2022)  Housing: Low Risk  (10/19/2022)  Transportation Needs: No Transportation Needs (10/19/2022)  Utilities: Not At Risk (10/19/2022)  Depression (PHQ2-9): Medium Risk (12/31/2019)  Tobacco Use: High Risk (10/19/2022)   SDOH Interventions:     Readmission Risk Interventions     No data to display

## 2022-10-20 NOTE — Progress Notes (Signed)
OT Cancellation Note  Patient Details Name: Asten Pelican MRN: [id number] DOB: [birth year]   Cancelled Treatment:    Reason Eval/Treat Not Completed: Fatigue/lethargy limiting ability to participate. Pt stating he would rather nap.   Alvester Morin 10/20/2022, 2:40 PM

## 2022-10-21 DIAGNOSIS — K7460 Unspecified cirrhosis of liver: Secondary | ICD-10-CM | POA: Diagnosis not present

## 2022-10-21 DIAGNOSIS — K7290 Hepatic failure, unspecified without coma: Secondary | ICD-10-CM | POA: Diagnosis not present

## 2022-10-21 LAB — CBC WITH DIFFERENTIAL/PLATELET
Abs Immature Granulocytes: 0.01 10*3/uL (ref 0.00–0.07)
Basophils Absolute: 0 10*3/uL (ref 0.0–0.1)
Basophils Relative: 1 %
Eosinophils Absolute: 0.1 10*3/uL (ref 0.0–0.5)
Eosinophils Relative: 2 %
HCT: 30 % — ABNORMAL LOW (ref 39.0–52.0)
Hemoglobin: 9.7 g/dL — ABNORMAL LOW (ref 13.0–17.0)
Immature Granulocytes: 0 %
Lymphocytes Relative: 44 %
Lymphs Abs: 2.1 10*3/uL (ref 0.7–4.0)
MCH: 29.4 pg (ref 26.0–34.0)
MCHC: 32.3 g/dL (ref 30.0–36.0)
MCV: 90.9 fL (ref 80.0–100.0)
Monocytes Absolute: 0.4 10*3/uL (ref 0.1–1.0)
Monocytes Relative: 8 %
Neutro Abs: 2.2 10*3/uL (ref 1.7–7.7)
Neutrophils Relative %: 45 %
Platelets: 118 10*3/uL — ABNORMAL LOW (ref 150–400)
RBC: 3.3 MIL/uL — ABNORMAL LOW (ref 4.22–5.81)
RDW: 15.2 % (ref 11.5–15.5)
WBC: 4.7 10*3/uL (ref 4.0–10.5)
nRBC: 0 % (ref 0.0–0.2)

## 2022-10-21 LAB — BASIC METABOLIC PANEL
Anion gap: 6 (ref 5–15)
BUN: 6 mg/dL (ref 6–20)
CO2: 24 mmol/L (ref 22–32)
Calcium: 7.8 mg/dL — ABNORMAL LOW (ref 8.9–10.3)
Chloride: 111 mmol/L (ref 98–111)
Creatinine, Ser: 0.56 mg/dL — ABNORMAL LOW (ref 0.61–1.24)
GFR, Estimated: >60 mL/min (ref >60)
Glucose, Bld: 82 mg/dL (ref 70–99)
Potassium: 4.1 mmol/L (ref 3.5–5.1)
Sodium: 141 mmol/L (ref 135–145)

## 2022-10-21 LAB — GLUCOSE, CAPILLARY
Glucose-Capillary: 71 mg/dL (ref 70–99)
Glucose-Capillary: 93 mg/dL (ref 70–99)
Glucose-Capillary: 82 mg/dL (ref 70–99)
Glucose-Capillary: 125 mg/dL — ABNORMAL HIGH (ref 70–99)

## 2022-10-21 LAB — BODY FLUID CULTURE W GRAM STAIN

## 2022-10-21 NOTE — Evaluation (Signed)
Occupational Therapy Evaluation Patient Details Name: Shaun Roberts MRN: [id number] DOB: 1966/02/18 Today's Date: 10/21/2022   History of Present Illness [name] is a 57 y/o M with PMH of cirrhosis, COPD, ETOH abuse, DM2, orthostatic, depression, FTT. Admitted for paracentesis (5L taken off 5/21). Was d/c from [facility] on 4/11 to SNF rehab.   Clinical Impression   Patient received for OT evaluation. See flowsheet below for details of function. Generally, patient requiring MOD (I) for bed mobility, MIN A-CGA with RW for functional mobility, and CGA-MIN A for ADLs. Pt has very poor safety awareness and decreased cognition and memory.   Patient will benefit from continued OT while in acute care.     Recommendations for follow up therapy are one component of a multi-disciplinary discharge planning process, led by the attending physician.  Recommendations may be updated based on patient status, additional functional criteria and insurance authorization.   Assistance Recommended at Discharge Frequent or constant Supervision/Assistance  Patient can return home with the following A little help with walking and/or transfers;A little help with bathing/dressing/bathroom;Assistance with cooking/housework;Direct supervision/assist for medications management;Direct supervision/assist for financial management;Assist for transportation;Help with stairs or ramp for entrance    Functional Status Assessment  Patient has had a recent decline in their functional status and demonstrates the ability to make significant improvements in function in a reasonable and predictable amount of time.  Equipment Recommendations  Other (comment) (defer to next venue of care)    Recommendations for Other Services       Precautions / Restrictions Precautions Precautions: Fall Restrictions Weight Bearing Restrictions: No      Mobility Bed Mobility Overal bed mobility: Modified Independent                   Transfers Overall transfer level: Needs assistance Equipment used: Rolling walker (2 wheels) Transfers: Sit to/from Stand Sit to Stand: Min guard                  Balance Overall balance assessment: Needs assistance Sitting-balance support: Feet supported Sitting balance-Leahy Scale: Good     Standing balance support: Bilateral upper extremity supported, Reliant on assistive device for balance Standing balance-Leahy Scale: Poor                             ADL either performed or assessed with clinical judgement   ADL Overall ADL's : Needs assistance/impaired   Eating/Feeding Details (indicate cue type and reason): anticipate set up from seated/semi-reclined Grooming: Wash/dry hands;Min guard;Standing Grooming Details (indicate cue type and reason): stood at sink after toileting for hand hygiene   Upper Body Bathing Details (indicate cue type and reason): anticipate set up from seated only   Lower Body Bathing Details (indicate cue type and reason): anticipate need for CGA in standing and grab bars for safety   Upper Body Dressing Details (indicate cue type and reason): anticipate set up   Lower Body Dressing Details (indicate cue type and reason): anticipate CGA for safety while standing and use of RW. Pt able to lean forward at hips while seated Toilet Transfer: Teacher, early years/pre Details (indicate cue type and reason): pt holding onto RW instead of using grab bar during stand to sit t/f. CGA for safety Toileting- Clothing Manipulation and Hygiene: Min guard;Sit to/from stand Toileting - Clothing Manipulation Details (indicate cue type and reason): pt able to lean forward to pull up pants; CGA while pt did not have  a hand on RW due to poor balance   Tub/Shower Transfer Details (indicate cue type and reason): defer 2/2 safety Functional mobility during ADLs: Min guard;Minimal assistance;Rolling walker (2 wheels) (gait belt used for  safety; attempted without RW and pt requiring MOD A; losing balance forward; flexed posture during mobility with RW.) General ADL Comments: Pt with decreased safety awareness; thinks he'll be fine at home; does not seem to realize how poor his balance is in standing with AD; did not want OT in the room during peri hygiene, but OT insisted 2/2 safety and pt allowed OT to remain present next to him.     Vision Baseline Vision/History: 1 Wears glasses Ability to See in Adequate Light: 0 Adequate       Perception     Praxis      Pertinent Vitals/Pain Pain Assessment Pain Assessment: 0-10 Pain Score:  (unrated) Pain Location: chronic back pain Pain Descriptors / Indicators: Other (Comment) (pt leaning forward in standing) Pain Intervention(s): Limited activity within patient's tolerance, Monitored during session     Hand Dominance     Extremity/Trunk Assessment Upper Extremity Assessment Upper Extremity Assessment: Overall WFL for tasks assessed   Lower Extremity Assessment Lower Extremity Assessment: Defer to PT evaluation   Cervical / Trunk Assessment Cervical / Trunk Assessment:  (pt leaning forward 2/2 back pain)   Communication Communication Communication: No difficulties   Cognition Arousal/Alertness: Awake/alert Behavior During Therapy: WFL for tasks assessed/performed Overall Cognitive Status: No family/caregiver present to determine baseline cognitive functioning                                 General Comments: Pt is oriented to self, year, and the fact that it's the 23rd, but thinks that it is June and thinks that it is Tuesday (it's actually Thursday). Is unable to give an accurate reporting of how long he's been in SNF rehab or what he's been working on with therapy or progress made; is able to state that he did not like rehab. Very poor awareness of deficits, such as not wanting OT to be standing by while he stood for peri hygiene despite being strong  fall risk. Pt polite during session.     General Comments  Pt on room air throughout session. BIL UE strength/ROM WFL.    Exercises     Shoulder Instructions      Home Living Family/patient expects to be discharged to:: Private residence Living Arrangements: Alone (states that his ex-wife and son can assist, although they work during the day) Available Help at Discharge: Family;Available PRN/intermittently Type of Home: House Home Access: Stairs to enter Entrance Stairs-Number of Steps: 2   Home Layout: One level     Bathroom Shower/Tub: Chief Strategy Officer: Standard     Home Equipment: Agricultural consultant (2 wheels)   Additional Comments: Pt confirms that home set up remains the same, although pt has not been home since d/c to rehab on 4/11.      Prior Functioning/Environment Prior Level of Function : Needs assist             Mobility Comments: Pt unable to state how he was doing in rehab with mobility; anticipate use of RW since pt's balance is very poor today. ADLs Comments: Unknown level of assist in rehab        OT Problem List: Decreased activity tolerance;Impaired balance (sitting and/or standing);Decreased cognition;Decreased safety  awareness;Decreased knowledge of use of DME or AE      OT Treatment/Interventions: Self-care/ADL training;Therapeutic exercise;Therapeutic activities    OT Goals(Current goals can be found in the care plan section) Acute Rehab OT Goals Patient Stated Goal: Get better OT Goal Formulation: With patient Time For Goal Achievement: 11/04/22 Potential to Achieve Goals: Good ADL Goals Pt Will Perform Grooming: with modified independence;standing Pt Will Perform Lower Body Dressing: with modified independence;sit to/from stand Pt Will Transfer to Toilet: with modified independence;regular height toilet;ambulating Pt Will Perform Toileting - Clothing Manipulation and hygiene: with modified independence;sit to/from stand   OT Frequency: Min 1X/week    Co-evaluation PT/OT/SLP Co-Evaluation/Treatment: Yes Reason for Co-Treatment: For patient/therapist safety   OT goals addressed during session: ADL's and self-care      AM-PAC OT "6 Clicks" Daily Activity     Outcome Measure Help from another person eating meals?: None Help from another person taking care of personal grooming?: A Little Help from another person toileting, which includes using toliet, bedpan, or urinal?: A Little Help from another person bathing (including washing, rinsing, drying)?: A Lot Help from another person to put on and taking off regular upper body clothing?: None Help from another person to put on and taking off regular lower body clothing?: A Little 6 Click Score: 19   End of Session Equipment Utilized During Treatment: Gait belt;Rolling walker (2 wheels) Nurse Communication: Mobility status  Activity Tolerance: Patient tolerated treatment well Patient left: in chair;with call bell/phone within reach;with chair alarm set  OT Visit Diagnosis: Unsteadiness on feet (R26.81)                Time: 1478-2956 OT Time Calculation (min): 23 min Charges:  OT General Charges $OT Visit: 1 Visit OT Evaluation $OT Eval Moderate Complexity: 1 Mod  Anwitha Mapes Junie Panning, MS, OTR/L  Alvester Morin 10/21/2022, 10:30 AM

## 2022-10-21 NOTE — Evaluation (Signed)
Physical Therapy Evaluation Patient Details Name: Shaun Roberts MRN: [id number] DOB: 09-14-1965 Today's Date: 10/21/2022  History of Present Illness  Shaun Roberts is a 57 y/o M with PMH of cirrhosis, COPD, ETOH abuse, DM2, orthostatic, depression, FTT. Admitted for paracentesis (5L taken off 5/21). Was d/c from [facility] on 4/11 to SNF rehab.  Clinical Impression  Pt is a pleasant 57 year old male who was admitted for decompensated hepatic cirrhosis with complaints of abdominal pain. Pt performs bed mobility with mod I, transfers with cga, and ambulation with cga and inconsistent min assist secondary to balance using RW. Pt demonstrates deficits with strength/balance. Pt demonstrates poor safety awareness and request to ambulate without AD. 5' performed with heavy assist for fall prevention. Pt educated that RW provided increased stability and to use it at all times. Would benefit from skilled PT to address above deficits and promote optimal return to PLOF. Pt will continue to receive skilled PT services while admitted and will defer to TOC/care team for updates regarding disposition planning.       Recommendations for follow up therapy are one component of a multi-disciplinary discharge planning process, led by the attending physician.  Recommendations may be updated based on patient status, additional functional criteria and insurance authorization.  Follow Up Recommendations Can patient physically be transported by private vehicle: Yes     Assistance Recommended at Discharge Intermittent Supervision/Assistance  Patient can return home with the following  A little help with walking and/or transfers;A little help with bathing/dressing/bathroom;Help with stairs or ramp for entrance    Equipment Recommendations Rolling walker (2 wheels)  Recommendations for Other Services       Functional Status Assessment Patient has had a recent decline in their functional status and demonstrates the ability  to make significant improvements in function in a reasonable and predictable amount of time.     Precautions / Restrictions Precautions Precautions: Fall Restrictions Weight Bearing Restrictions: No      Mobility  Bed Mobility Overal bed mobility: Modified Independent             General bed mobility comments: uses bed railing for safety. Once seated at EOB, upright posture noted    Transfers Overall transfer level: Needs assistance Equipment used: Rolling walker (2 wheels) Transfers: Sit to/from Stand Sit to Stand: Min guard           General transfer comment: need cues for correct hand placement. Flexed posture once standing and keeps RW too far away from body    Ambulation/Gait Ambulation/Gait assistance: Min guard Gait Distance (Feet): 50 Feet Assistive device: Rolling walker (2 wheels) Gait Pattern/deviations: Step-through pattern       General Gait Details: ambulated in room to bathroom with RW and required inconsistent min assist for balance. Pt demonstrates poor safety awareness during mobility efforts  Stairs            Wheelchair Mobility    Modified Rankin (Stroke Patients Only)       Balance Overall balance assessment: Needs assistance Sitting-balance support: Feet supported Sitting balance-Leahy Scale: Good     Standing balance support: Bilateral upper extremity supported, Reliant on assistive device for balance Standing balance-Leahy Scale: Poor                               Pertinent Vitals/Pain Pain Assessment Pain Assessment: Faces Faces Pain Scale: Hurts a little bit Pain Location: chronic back pain Pain Descriptors / Indicators:  Discomfort Pain Intervention(s): Limited activity within patient's tolerance, Repositioned    Home Living Family/patient expects to be discharged to:: Private residence Living Arrangements: Alone Available Help at Discharge: Family;Available PRN/intermittently Type of Home:  House Home Access: Stairs to enter   Entrance Stairs-Number of Steps: 2   Home Layout: One level   Additional Comments: Pt confirms that home set up remains the same, although pt has not been home since d/c to rehab on 4/11.    Prior Function Prior Level of Function : Needs assist             Mobility Comments: Pt unable to state how he was doing in rehab with mobility; anticipate use of RW since pt's balance is very poor today. ADLs Comments: Unknown level of assist in rehab     Hand Dominance        Extremity/Trunk Assessment   Upper Extremity Assessment Upper Extremity Assessment: Overall WFL for tasks assessed    Lower Extremity Assessment Lower Extremity Assessment: Generalized weakness (B LE grossly 4/5)       Communication   Communication: No difficulties  Cognition Arousal/Alertness: Awake/alert Behavior During Therapy: WFL for tasks assessed/performed Overall Cognitive Status: No family/caregiver present to determine baseline cognitive functioning                                 General Comments: Pt is oriented to self, year, and the fact that it's the 23rd, but thinks that it is June and thinks that it is Tuesday (it's actually Thursday). Is unable to give an accurate reporting of how long he's been in SNF rehab or what he's been working on with therapy or progress made; is able to state that he did not like rehab. Very poor awareness of deficits, such as not wanting OT to be standing by while he stood for peri hygiene despite being strong fall risk. Pt polite during session.        General Comments General comments (skin integrity, edema, etc.): Pt on RA with all mobility.    Exercises Other Exercises Other Exercises: ambulated to bathroom and able to perform transfers on/off standard toilet with cga. Needs supervision for hygiene. Other Exercises: 5' gait training performed without AD with mod assist and LOB noted   Assessment/Plan     PT Assessment Patient needs continued PT services  PT Problem List Decreased strength;Decreased activity tolerance;Decreased balance;Decreased mobility;Decreased safety awareness;Decreased cognition       PT Treatment Interventions DME instruction;Gait training;Stair training;Therapeutic exercise;Balance training    PT Goals (Current goals can be found in the Care Plan section)  Acute Rehab PT Goals Patient Stated Goal: to go home PT Goal Formulation: With patient Time For Goal Achievement: 11/04/22 Potential to Achieve Goals: Good    Frequency Min 3X/week     Co-evaluation PT/OT/SLP Co-Evaluation/Treatment: Yes Reason for Co-Treatment: For patient/therapist safety PT goals addressed during session: Mobility/safety with mobility OT goals addressed during session: ADL's and self-care       AM-PAC PT "6 Clicks" Mobility  Outcome Measure Help needed turning from your back to your side while in a flat bed without using bedrails?: None Help needed moving from lying on your back to sitting on the side of a flat bed without using bedrails?: None Help needed moving to and from a bed to a chair (including a wheelchair)?: A Little Help needed standing up from a chair using your arms (e.g., wheelchair  or bedside chair)?: A Little Help needed to walk in hospital room?: A Little Help needed climbing 3-5 steps with a railing? : A Lot 6 Click Score: 19    End of Session Equipment Utilized During Treatment: Gait belt Activity Tolerance: Patient tolerated treatment well Patient left: in chair;with chair alarm set Nurse Communication: Mobility status PT Visit Diagnosis: Unsteadiness on feet (R26.81);Muscle weakness (generalized) (M62.81);Difficulty in walking, not elsewhere classified (R26.2)    Time: 0940-1003 PT Time Calculation (min) (ACUTE ONLY): 23 min   Charges:   PT Evaluation $PT Eval Low Complexity: 1 Low          Elizabeth Palau, PT, DPT,  [facility] [phone]   Danaly Bari 10/21/2022, 2:50 PM

## 2022-10-21 NOTE — TOC Progression Note (Signed)
Transition of Care [facility]) - Progression Note    Patient Details  Name: Shaun Roberts MRN: [id number] Date of Birth: 03-Apr-1966  Transition of Care [facility]) CM/SW Contact  Margarito Liner, LCSW Phone Number: 10/21/2022, 4:10 PM  Clinical Narrative:  Patient prefers to return home at discharge rather than [facility]. Discussed home health. He would like to review list with his wife. Will follow up tomorrow. Wife or son will transport him home at discharge.   Expected Discharge Plan:  (TBD) Barriers to Discharge: Continued Medical Work up  Expected Discharge Plan and Services     Post Acute Care Choice:  (TBD) Living arrangements for the past 2 months: Skilled Nursing Facility, Single Family Home                                       Social Determinants of Health (SDOH) Interventions SDOH Screenings   Food Insecurity: No Food Insecurity (10/19/2022)  Housing: Low Risk  (10/19/2022)  Transportation Needs: No Transportation Needs (10/19/2022)  Utilities: Not At Risk (10/19/2022)  Depression (PHQ2-9): Medium Risk (12/31/2019)  Tobacco Use: High Risk (10/19/2022)    Readmission Risk Interventions     No data to display

## 2022-10-21 NOTE — Progress Notes (Signed)
Progress Note   Patient: Shaun Roberts ZOX:[id number] DOB: [birth year] DOA: 10/19/2022     1 DOS: the patient was seen and examined on 10/21/2022       Subjective:  Patient seen and examined at bedside this morning Patient was seen by physical therapist who recommended skilled nursing facility placement Denied worsening abdominal pain nausea vomiting chest pain or cough     Brief hospital course:   From HPI: By Dr. Huel Cote "Shaun Roberts is a 57 y.o. male with medical history significant of alcohol use disorder, COPD, type 2 diabetes, hypertension, hyperlipidemia, who presents to the ED due to abdominal pain.   Shaun Roberts states that for the last 6 weeks approximately, he has noticed gradually worsening abdominal distention.  He notes that in the past couple days, it has gotten significantly worse.  In addition, he endorses diffuse abdominal pain that seems to start in the lower abdomen and radiates upwards.  He has also been experience significant itchiness of his abdominal skin.  He endorses chills but denies any fevers, nausea, vomiting.  He endorses severe constipation stating he is only had 1 bowel movement in the last 6 weeks despite trying to enemas.  He endorses difficulty with urination, straining he has to strain to urinate, especially once the distention started.  He endorses shortness of breath since distention started but denies any chest pain.   ED course: On arrival to the ED, patient was hypertensive at 129/116 with heart rate of 109.  He was afebrile and 97.7.  He was saturating at 100% on room air.  Initial workup notable for WBC of 5.2, hemoglobin 11.2, platelets of 155, potassium 2.9, bicarb 27, creatinine 0.63 with GFR above 60, and INR 1.5.  CT of the abdomen was obtained that demonstrated nodular hepatic surface contour concerning for cirrhosis in addition to portal hypertension, moderate-large volume ascites and upper abdominal varices."     Assessment and Plan:      Decompensated hepatic cirrhosis (HCC) Patient is presenting with acutely decompensated hepatic cirrhosis with large volume ascites.  No prior history of ascites. - S/p paracentesis after 5 L of ascitic fluid removed -Follow-up on peritoneal cell count with culture, in addition to albumin - Pending peritoneal cell count, will cover for SBP with ceftriaxone 2 g daily - Continue spironolactone and Lasix before discharge - Outpatient follow-up with Dr. Norma Fredrickson in July; may benefit from moving this appointment up   Alcohol abuse Patient states he is no longer drinking alcohol for the last 2 months.  No evidence of withdrawal at this time.  - CIWA monitoring without Ativan - Thiamine, folic acid and multivitamin daily   History of DVT (deep vein thrombosis) History of DVT in the past, not currently on any anticoagulation.   Left calf is larger than the right, Doppler did not show any clot   COPD (chronic obstructive pulmonary disease) (HCC) History of COPD, with shortness of breath and cough today, however most likely secondary to large volume ascites.  Pulmonary exam is reassuring.   - Continue home bronchodilators - DuoNebs as needed   Orthostatic hypotension Previous history of orthostatic hypotension treated with Florinef and midodrine.  Per nursing home medication reconciliation, patient is still taking.  -Continue home Florinef 0.1 mg daily and midodrine 15 mg 3 times daily   Controlled type 2 diabetes mellitus with hyperglycemia (HCC) - SSI, sensitive   Constipation Potentially in the setting of large volume ascites.  Will start as needed lactulose for now and monitor  for improvement after paracentesis.   -Daily lactulose as needed   Advance Care Planning:   Code Status: Full Code verified by patient.  He states that he would not want long-term life support, but is amenable to a short-term resuscitative effort   Consults: IR   Family Communication: No family at bedside      Physical Exam: Physical Exam Vitals and nursing note reviewed.  Constitutional:      General: He is not in acute distress. HENT:     Head: Normocephalic and atraumatic.     Mouth/Throat:     Mouth: Mucous membranes are moist.     Pharynx: Oropharynx is clear.  Eyes:     General: Scleral icterus (Very subtle) present.     Extraocular Movements: Extraocular movements intact.     Pupils: Pupils are equal, round, and reactive to light.  Cardiovascular:     Rate and Rhythm: Regular rhythm. Pulmonary:     Effort: Pulmonary effort is normal. No respiratory distress.     Breath sounds: No wheezing, rhonchi or rales.  Abdominal:     General: Bowel sounds are normal.  Mildly distended Skin:    General: Skin is warm and dry.  Neurological:     General: No focal deficit present.     Mental Status: He is alert and oriented to person, place, and time.  Psychiatric:        Mood and Affect: Mood normal.        Behavior: Behavior normal.      Data Reviewed: I reviewed patient's labs showing sodium sodium 141 potassium 4.1 creatinine 0.56 hemoglobin 9.7   time spent: 45 minutes   Vitals:   10/20/22 1655 10/20/22 2029 10/21/22 0423 10/21/22 0813  BP: 116/71 120/74 111/70 106/68  Pulse: 87 74 82 85  Resp: 16 17 18 17   Temp: 98 F (36.7 C) 98 F (36.7 C) 98 F (36.7 C) 98.4 F (36.9 C)  TempSrc: Oral Oral Oral Oral  SpO2: 100% 99% 98% 98%  Weight:      Height:        Author: Loyce Dys, MD 10/21/2022 2:46 PM  For on call review [email or web address].

## 2022-10-22 DIAGNOSIS — K7460 Unspecified cirrhosis of liver: Secondary | ICD-10-CM | POA: Diagnosis not present

## 2022-10-22 DIAGNOSIS — K7290 Hepatic failure, unspecified without coma: Secondary | ICD-10-CM | POA: Diagnosis not present

## 2022-10-22 LAB — CBC WITH DIFFERENTIAL/PLATELET
Abs Immature Granulocytes: 0.02 10*3/uL (ref 0.00–0.07)
Basophils Absolute: 0 10*3/uL (ref 0.0–0.1)
Basophils Relative: 1 %
Eosinophils Absolute: 0.1 10*3/uL (ref 0.0–0.5)
Eosinophils Relative: 1 %
HCT: 30.2 % — ABNORMAL LOW (ref 39.0–52.0)
Hemoglobin: 9.8 g/dL — ABNORMAL LOW (ref 13.0–17.0)
Immature Granulocytes: 0 %
Lymphocytes Relative: 43 %
Lymphs Abs: 2 10*3/uL (ref 0.7–4.0)
MCH: 29.4 pg (ref 26.0–34.0)
MCHC: 32.5 g/dL (ref 30.0–36.0)
MCV: 90.7 fL (ref 80.0–100.0)
Monocytes Absolute: 0.4 10*3/uL (ref 0.1–1.0)
Monocytes Relative: 8 %
Neutro Abs: 2.2 10*3/uL (ref 1.7–7.7)
Neutrophils Relative %: 47 %
Platelets: 126 10*3/uL — ABNORMAL LOW (ref 150–400)
RBC: 3.33 MIL/uL — ABNORMAL LOW (ref 4.22–5.81)
RDW: 15.2 % (ref 11.5–15.5)
WBC: 4.7 10*3/uL (ref 4.0–10.5)
nRBC: 0 % (ref 0.0–0.2)

## 2022-10-22 LAB — BASIC METABOLIC PANEL
Anion gap: 4 — ABNORMAL LOW (ref 5–15)
BUN: 7 mg/dL (ref 6–20)
CO2: 23 mmol/L (ref 22–32)
Calcium: 7.9 mg/dL — ABNORMAL LOW (ref 8.9–10.3)
Chloride: 110 mmol/L (ref 98–111)
Creatinine, Ser: 0.56 mg/dL — ABNORMAL LOW (ref 0.61–1.24)
GFR, Estimated: >60 mL/min (ref >60)
Glucose, Bld: 84 mg/dL (ref 70–99)
Potassium: 3.9 mmol/L (ref 3.5–5.1)
Sodium: 137 mmol/L (ref 135–145)

## 2022-10-22 LAB — GLUCOSE, CAPILLARY
Glucose-Capillary: 87 mg/dL (ref 70–99)
Glucose-Capillary: 100 mg/dL — ABNORMAL HIGH (ref 70–99)
Glucose-Capillary: 68 mg/dL — ABNORMAL LOW (ref 70–99)
Glucose-Capillary: 80 mg/dL (ref 70–99)

## 2022-10-22 LAB — LIPASE, FLUID: Lipase-Fluid: 14 U/L

## 2022-10-22 MED ORDER — FUROSEMIDE 20 MG PO TABS: 20.0000 mg | ORAL_TABLET | Freq: Every day | ORAL | Status: DC

## 2022-10-22 MED ORDER — SPIRONOLACTONE 50 MG PO TABS: 50.0000 mg | ORAL_TABLET | Freq: Every day | ORAL | Status: DC

## 2022-10-22 NOTE — TOC Transition Note (Signed)
Transition of Care [facility]) - CM/SW Discharge Note   Patient Details  Name: Shaun Roberts MRN: [id number] Date of Birth: July 03, 1965  Transition of Care [facility]) CM/SW Contact:  Liliana Cline, LCSW Phone Number: 10/22/2022, 4:49 PM   Clinical Narrative:    Discharge to [facility] today. Room 5B. CSW Maralyn Sago confirmed with Admissions Worker Tanya, completed EMS paperwork, and updated patient/family.  [facility] called for transport, patient is 2nd on the list as of 4:50 pm, bedside RN aware.     Final next level of care: Skilled Nursing Facility Barriers to Discharge: Barriers Resolved   Patient Goals and CMS Choice   Choice offered to / list presented to : Patient  Discharge Placement                Patient chooses bed at: [facility] Patient to be transferred to facility by: [facility]      Discharge Plan and Services Additional resources added to the After Visit Summary for       Post Acute Care Choice:  (TBD)                               Social Determinants of Health (SDOH) Interventions SDOH Screenings   Food Insecurity: No Food Insecurity (10/19/2022)  Housing: Low Risk  (10/19/2022)  Transportation Needs: No Transportation Needs (10/19/2022)  Utilities: Not At Risk (10/19/2022)  Depression (PHQ2-9): Medium Risk (12/31/2019)  Tobacco Use: High Risk (10/19/2022)     Readmission Risk Interventions     No data to display

## 2022-10-22 NOTE — Care Management Important Message (Signed)
Important Message  Patient Details  Name: Shaun Roberts MRN: [id number] Date of Birth: 04-17-1966   Medicare Important Message Given:  Yes  Initial Medicare IM signed by patient.  Copy left with patient, original to be scanned into chart.    Johnell Comings 10/22/2022, 10:17 AM

## 2022-10-22 NOTE — NC FL2 (Signed)
[place] MEDICAID FL2 LEVEL OF CARE FORM     IDENTIFICATION  Patient Name: Shaun Roberts Birthdate: February 07, 1966 Sex: male Admission Date (Current Location): 10/19/2022  [facility] and [place] Number:  Chiropodist and Address:  [facility], [address], [place], [place] [address]      Provider Number: [id number]  Attending Physician Name and Address:  Loyce Dys, MD  Relative Name and Phone Number:       Current Level of Care: Hospital Recommended Level of Care: Skilled Nursing Facility Prior Approval Number:    Date Approved/Denied:   PASRR Number: [id number]  Discharge Plan: SNF    Current Diagnoses: Patient Active Problem List   Diagnosis Date Noted   Decompensated hepatic cirrhosis (HCC) 10/19/2022   Constipation 10/19/2022   Upper GI bleed 09/08/2022   Orthostatic hypotension 09/08/2022   Hypokalemia 09/08/2022   Controlled type 2 diabetes mellitus without complication, without long-term current use of insulin (HCC) 09/08/2022   Decubitus ulcer of right heel, stage 2 (HCC) 09/08/2022   Pancytopenia (HCC) 09/06/2022   Hypomagnesemia 09/06/2022   Hypophosphatemia 09/05/2022   Pressure injury of skin 09/05/2022   Protein-calorie malnutrition, severe (HCC) 09/05/2022   Alcohol abuse 09/04/2022   AKI (acute kidney injury) (HCC) 09/04/2022   History of DVT (deep vein thrombosis) 09/04/2022   Alcoholic ketoacidosis 09/04/2022   Acute blood loss anemia 09/04/2022   Thrombocytopenia (HCC) 09/04/2022   Failure to thrive in adult 09/04/2022   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   Vitamin D deficiency 08/11/2021   Acute deep vein thrombosis (DVT) of right popliteal vein (HCC) 08/11/2021   Hyponatremia 08/11/2021   Moderate episode of recurrent major depressive disorder (HCC) 12/31/2019   Insomnia 06/05/2018   Special screening for malignant neoplasms, colon    Benign neoplasm of transverse colon    Polyp of sigmoid colon     Preventative health care 05/10/2018   Tobacco abuse 05/10/2018   Trigger finger 05/04/2018   Alcohol dependence in early full remission (HCC) 05/04/2018   Rib fractures 04/13/2018   COPD (chronic obstructive pulmonary disease) (HCC) 04/13/2018   Controlled type 2 diabetes mellitus with hyperglycemia (HCC) 04/13/2018    Orientation RESPIRATION BLADDER Height & Weight     Self, Time, Situation, Place  Normal Continent Weight: 171 lb 8.3 oz (77.8 kg) Height:  5\' 11"  (180.3 cm)  BEHAVIORAL SYMPTOMS/MOOD NEUROLOGICAL BOWEL NUTRITION STATUS   (None)  (None) Continent Diet (2 gram sodium)  AMBULATORY STATUS COMMUNICATION OF NEEDS Skin   Limited Assist Verbally Skin abrasions                       Personal Care Assistance Level of Assistance  Bathing, Feeding, Dressing Bathing Assistance: Limited assistance Feeding assistance: Limited assistance Dressing Assistance: Limited assistance     Functional Limitations Info  Sight, Hearing, Speech Sight Info: Adequate Hearing Info: Adequate Speech Info: Adequate    SPECIAL CARE FACTORS FREQUENCY  PT (By licensed PT), OT (By licensed OT)     PT Frequency: 5 x week OT Frequency: 5 x week            Contractures Contractures Info: Not present    Additional Factors Info  Code Status, Allergies Code Status Info: Full code Allergies Info: NKDA           Current Medications (10/22/2022):  This is the current hospital active medication list Current Facility-Administered Medications  Medication Dose Route Frequency Provider Last Rate Last  Admin   acetaminophen (TYLENOL) tablet 650 mg  650 mg Oral Q6H PRN Verdene Lennert, MD   650 mg at 10/21/22 1017   Or   acetaminophen (TYLENOL) suppository 650 mg  650 mg Rectal Q6H PRN Verdene Lennert, MD       feeding supplement (GLUCERNA SHAKE) (GLUCERNA SHAKE) liquid 237 mL  237 mL Oral BID BM Verdene Lennert, MD   237 mL at 10/22/22 1053   fludrocortisone (FLORINEF) tablet 0.1 mg  0.1  mg Oral Daily Verdene Lennert, MD   0.1 mg at 10/22/22 1043   folic acid (FOLVITE) tablet 1 mg  1 mg Oral Daily Verdene Lennert, MD   1 mg at 10/22/22 1043   furosemide (LASIX) tablet 20 mg  20 mg Oral Daily Verdene Lennert, MD   20 mg at 10/22/22 1042   And   spironolactone (ALDACTONE) tablet 50 mg  50 mg Oral Daily Verdene Lennert, MD   50 mg at 10/22/22 1043   insulin aspart (novoLOG) injection 0-9 Units  0-9 Units Subcutaneous TID WC Verdene Lennert, MD       lactulose (CHRONULAC) 10 GM/15ML solution 20 g  20 g Oral Daily PRN Verdene Lennert, MD       LORazepam (ATIVAN) tablet 1 mg  1 mg Oral Once PRN Verdene Lennert, MD       midodrine (PROAMATINE) tablet 15 mg  15 mg Oral TID WC Verdene Lennert, MD   15 mg at 10/22/22 1322   mirtazapine (REMERON SOL-TAB) disintegrating tablet 15 mg  15 mg Oral QHS Verdene Lennert, MD   15 mg at 10/21/22 2114   multivitamin with minerals tablet 1 tablet  1 tablet Oral Daily Verdene Lennert, MD   1 tablet at 10/22/22 1043   ondansetron (ZOFRAN) tablet 4 mg  4 mg Oral Q6H PRN Verdene Lennert, MD       Or   ondansetron (ZOFRAN) injection 4 mg  4 mg Intravenous Q6H PRN Verdene Lennert, MD       pantoprazole (PROTONIX) EC tablet 40 mg  40 mg Oral BID Verdene Lennert, MD   40 mg at 10/22/22 1043   potassium chloride (KLOR-CON) packet 40 mEq  40 mEq Oral BID Poggi, Jenna E, PA-C   40 mEq at 10/22/22 1044   senna-docusate (Senokot-S) tablet 1 tablet  1 tablet Oral Daily Verdene Lennert, MD   1 tablet at 10/22/22 1042   sodium chloride flush (NS) 0.9 % injection 3 mL  3 mL Intravenous Q12H Verdene Lennert, MD   3 mL at 10/22/22 1047   thiamine (VITAMIN B1) tablet 100 mg  100 mg Oral Daily Verdene Lennert, MD   100 mg at 10/22/22 1043     Discharge Medications: Please see discharge summary for a list of discharge medications.  Relevant Imaging Results:  Relevant Lab Results:   Additional Information SS#: [id number]  Margarito Liner, LCSW

## 2022-10-22 NOTE — Discharge Summary (Signed)
Physician Discharge Summary   Patient: Shaun Roberts MRN: [id number] DOB: [birth year]  Admit date:     10/19/2022  Discharge date: 10/22/22  Discharge Physician: Loyce Dys   PCP: Doreene Nest, NP    Discharge Diagnoses: Decompensated hepatic cirrhosis (HCC) Alcohol abuse History of DVT (deep vein thrombosis) COPD (chronic obstructive pulmonary disease) (HCC) History of orthostatic hypotension Controlled type 2 diabetes mellitus with hyperglycemia [facility]) Constipation-improved  Hospital Course: Shaun Roberts is a 57 y.o. male with medical history significant of alcohol use disorder, COPD, type 2 diabetes, hypertension, hyperlipidemia, who presents to the ED due to abdominal pain. According to him for the last 6 weeks approximately, he has noticed gradually worsening abdominal distention.  He notes that in the past couple days, it has gotten significantly worse.  In addition, he endorses diffuse abdominal pain that seems to start in the lower abdomen and radiates upwards.  On arrival to the ED, his blood pressure was  129/116 with heart rate of 109.  He was afebrile and 97.7.  He was saturating at 100% on room air.  Initial workup notable for WBC of 5.2, hemoglobin 11.2, platelets of 155, potassium 2.9, bicarb 27, creatinine 0.63 with GFR above 60, and INR 1.5.  CT of the abdomen was obtained that demonstrated nodular hepatic surface contour concerning for cirrhosis in addition to portal hypertension, moderate-large volume ascites and upper abdominal varices." Patient underwent paracentesis with removal of 5 L of fluid.  He has progressed resolution of abdominal pain.  Ascitic fluid analysis did not show any evidence of infection.  Patient being placed on initial antibiotics for possible concerns of SBP which at this point have been ruled out.  Patient was seen by PT OT who recommended skilled nursing facility placement.  Will have to continue to follow-up with his PCP as well as  gastroenterologist     Consultants: None Procedures performed: Paracentesis Disposition: To nursing facility Diet recommendation:  Discharge Diet Orders (From admission, onward)     Start     Ordered   10/22/22 0000  Diet - low sodium heart healthy        10/22/22 1530           Cardiac and Carb modified diet DISCHARGE MEDICATION: Allergies as of 10/22/2022   No Known Allergies      Medication List     STOP taking these medications    furosemide 10 MG/ML injection Commonly known as: LASIX Replaced by: furosemide 20 MG tablet   traZODone 50 MG tablet Commonly known as: DESYREL       TAKE these medications    acetaminophen 325 MG tablet Commonly known as: TYLENOL Take 2 tablets (650 mg total) by mouth every 6 (six) hours as needed for mild pain (or Fever >/= 101).   bisacodyl 10 MG/30ML Enem Commonly known as: FLEET Place 30 mLs rectally once as needed (constipation).   feeding supplement (GLUCERNA SHAKE) Liqd Take 237 mLs by mouth 2 (two) times daily between meals.   fludrocortisone 0.1 MG tablet Commonly known as: FLORINEF Take 1 tablet (0.1 mg total) by mouth daily.   folic acid 1 MG tablet Commonly known as: FOLVITE Take 1 tablet (1 mg total) by mouth daily.   furosemide 20 MG tablet Commonly known as: LASIX Take 1 tablet (20 mg total) by mouth daily. Start taking on: Oct 23, 2022 Replaces: furosemide 10 MG/ML injection   guaifenesin 100 MG/5ML syrup Commonly known as: ROBITUSSIN Take 5 mLs by mouth every  6 (six) hours as needed for cough.   Lidocanna 4 % Generic drug: lidocaine Place 1 patch onto the skin daily.   magnesium oxide 400 (240 Mg) MG tablet Commonly known as: MAG-OX Take 400 mg by mouth daily.   midodrine 5 MG tablet Commonly known as: PROAMATINE Take 3 tablets (15 mg total) by mouth 3 (three) times daily with meals.   mirtazapine 15 MG disintegrating tablet Commonly known as: REMERON SOL-TAB Take 15 mg by mouth at  bedtime.   multivitamin with minerals Tabs tablet Take 1 tablet by mouth daily.   pantoprazole 40 MG tablet Commonly known as: Protonix Take 1 tablet (40 mg total) by mouth 2 (two) times daily.   sennosides-docusate sodium 8.6-50 MG tablet Commonly known as: SENOKOT-S Take 1 tablet by mouth daily.   simethicone 125 MG chewable tablet Commonly known as: MYLICON Chew 125 mg by mouth every 6 (six) hours as needed for flatulence.   spironolactone 50 MG tablet Commonly known as: ALDACTONE Take 1 tablet (50 mg total) by mouth daily. Start taking on: Oct 23, 2022   thiamine 100 MG tablet Commonly known as: Vitamin B-1 Take 1 tablet (100 mg total) by mouth daily.               Durable Medical Equipment  (From admission, onward)           Start     Ordered   10/21/22 1059  For home use only DME Walker rolling  Once       Question Answer Comment  Walker: With 5 Inch Wheels   Patient needs a walker to treat with the following condition Weakness generalized      10/21/22 1058            Discharge Exam: Filed Weights   10/19/22 1049 10/20/22 0500 10/22/22 0800  Weight: 77.5 kg 80.6 kg 77.8 kg   Constitutional:      General: He is not in acute distress. HENT:     Head: Normocephalic and atraumatic.  Eyes:  Extraocular Movements: Extraocular movements intact.   Cardiovascular:     Rate and Rhythm: Regular rhythm. Pulmonary:     Effort: Pulmonary effort is normal. No respiratory distress.     Breath sounds: No wheezing, rhonchi or rales.  Abdominal:     General: Bowel sounds are normal.  Mildly distended Skin:    General: Skin is warm and dry.  Neurological:     General: No focal deficit present.     Mental Status: He is alert and oriented to person, place, and time.  Psychiatric:        Mood and Affect: Mood normal.        Behavior: Behavior normal.   Condition at discharge: good  The results of significant diagnostics from this hospitalization  (including imaging, microbiology, ancillary and laboratory) are listed below for reference.   Imaging Studies: US Paracentesis  Result Date: 10/20/2022 INDICATION: Ascites EXAM: ULTRASOUND GUIDED diagnostic and therapeutic left lower quadrant PARACENTESIS MEDICATIONS: 10 cc 1% lidocaine COMPLICATIONS: None immediate. PROCEDURE: Informed written consent was obtained from the patient after a discussion of the risks, benefits and alternatives to treatment. A timeout was performed prior to the initiation of the procedure. Initial ultrasound scanning demonstrates a large amount of ascites within the left lower abdominal quadrant. The left lower abdomen was prepped and draped in the usual sterile fashion. 1% lidocaine was used for local anesthesia. Following this, a 19 gauge, 7-cm, Yueh catheter was introduced.  An ultrasound image was saved for documentation purposes. The paracentesis was performed. The catheter was removed and a dressing was applied. The patient tolerated the procedure well without immediate post procedural complication. Patient received post-procedure intravenous albumin; see nursing notes for details. FINDINGS: A total of approximately 5 L of yellow fluid was removed. Samples were sent to the laboratory as requested by the clinical team. IMPRESSION: Successful ultrasound-guided paracentesis yielding 5 liters of peritoneal fluid. Procedure performed by Mina Marble, PA-C Electronically Signed   By: Olive Bass M.D.   On: 10/20/2022 07:50   US Venous Img Lower Unilateral Left (DVT)  Result Date: 10/19/2022 CLINICAL DATA:  Left lower extremity edema. EXAM: LEFT LOWER EXTREMITY VENOUS DOPPLER ULTRASOUND TECHNIQUE: Gray-scale sonography with graded compression, as well as color Doppler and duplex ultrasound were performed to evaluate the lower extremity deep venous systems from the level of the common femoral vein and including the common femoral, femoral, profunda femoral, popliteal and calf  veins including the posterior tibial, peroneal and gastrocnemius veins when visible. The superficial great saphenous vein was also interrogated. Spectral Doppler was utilized to evaluate flow at rest and with distal augmentation maneuvers in the common femoral, femoral and popliteal veins. COMPARISON:  None Available. FINDINGS: Contralateral Common Femoral Vein: Respiratory phasicity is normal and symmetric with the symptomatic side. No evidence of thrombus. Normal compressibility. Common Femoral Vein: No evidence of thrombus. Normal compressibility, respiratory phasicity and response to augmentation. Saphenofemoral Junction: No evidence of thrombus. Normal compressibility and flow on color Doppler imaging. Profunda Femoral Vein: No evidence of thrombus. Normal compressibility and flow on color Doppler imaging. Femoral Vein: No evidence of thrombus. Normal compressibility, respiratory phasicity and response to augmentation. Popliteal Vein: No evidence of thrombus. Normal compressibility, respiratory phasicity and response to augmentation. Calf Veins: No evidence of thrombus. Normal compressibility and flow on color Doppler imaging. Superficial Great Saphenous Vein: No evidence of thrombus. Normal compressibility. Venous Reflux:  None. Other Findings: No evidence of superficial thrombophlebitis or abnormal fluid collection. IMPRESSION: No evidence of left lower extremity deep venous thrombosis. Electronically Signed   By: Irish Lack M.D.   On: 10/19/2022 15:34   CT ABDOMEN PELVIS W CONTRAST  Result Date: 10/19/2022 CLINICAL DATA:  Abdominal pain, acute, nonlocalized abd distention EXAM: CT ABDOMEN AND PELVIS WITH CONTRAST TECHNIQUE: Multidetector CT imaging of the abdomen and pelvis was performed using the standard protocol following bolus administration of intravenous contrast. RADIATION DOSE REDUCTION: This exam was performed according to the departmental dose-optimization program which includes automated  exposure control, adjustment of the mA and/or kV according to patient size and/or use of iterative reconstruction technique. CONTRAST:  OMNIPAQUE IOHEXOL 300 MG/ML  SOLN COMPARISON:  Ultrasound 09/04/2022 FINDINGS: Lower chest: Included lung bases are clear. Heart size is normal. Paraesophageal varices. Hepatobiliary: Mildly decreased attenuation of the hepatic parenchyma. Slightly nodular hepatic surface contour. No focal liver lesion identified. Layering sludge and stones within the gallbladder. Pancreas: Unremarkable. No pancreatic ductal dilatation or surrounding inflammatory changes. Spleen: Spleen measures 12 cm in length. No splenic lesion identified. Adrenals/Urinary Tract: Unremarkable adrenal glands. Kidneys enhance symmetrically without focal lesion, stone, or hydronephrosis. Ureters are nondilated. Urinary bladder is decompressed. Stomach/Bowel: Stomach is within normal limits. Appendix appears normal. Submucosal fat deposition most pronounced within the ascending and transverse colon. No evidence of bowel wall thickening, distention, or inflammatory changes. Vascular/Lymphatic: Recanalization of the umbilical vein. Patent portal vein. Upper abdominal varices. Aortic atherosclerosis. No aneurysm. Mildly prominent upper abdominal lymph nodes including 12 mm  porta hepatis and pancreaticoduodenal nodes, likely reactive. Reproductive: Prostate is unremarkable. Other: Moderate-large volume ascites.  No pneumoperitoneum. Musculoskeletal: No acute or significant osseous findings. Mild anasarca. IMPRESSION: 1. Hepatic cirrhosis with evidence of portal hypertension including moderate-large volume ascites and upper abdominal varices. 2. Cholelithiasis without evidence of acute cholecystitis. 3. Submucosal fat deposition most pronounced within the ascending and transverse colon, which can be seen in the setting of chronic inflammation. 4. Aortic atherosclerosis (ICD10-I70.0). Electronically Signed   By:  Duanne Guess D.O.   On: 10/19/2022 13:02    Microbiology: Results for orders placed or performed during the hospital encounter of 10/19/22  Body fluid culture w Gram Stain     Status: None (Preliminary result)   Collection Time: 10/19/22  3:19 PM   Specimen: PATH Cytology Peritoneal fluid  Result Value Ref Range Status   Specimen Description   Final    PERITONEAL Performed at [facility], [address] [place], [place] [address]    Special Requests   Final    PERITONEAL Performed at [facility], [address] [place], [place] [address]    Gram Stain NO WBC SEEN NO ORGANISMS SEEN   Final   Culture   Final    NO GROWTH 3 DAYS Performed at [facility], [address], [place], [place] [address]    Report Status PENDING  Incomplete  MRSA Next Gen by PCR, Nasal     Status: None   Collection Time: 10/19/22  6:45 PM   Specimen: Nasal Mucosa; Nasal Swab  Result Value Ref Range Status   MRSA by PCR Next Gen NOT DETECTED NOT DETECTED Final    Comment: (NOTE) The GeneXpert MRSA Assay (FDA approved for NASAL specimens only), is one component of a comprehensive MRSA colonization surveillance program. It is not intended to diagnose MRSA infection nor to guide or monitor treatment for MRSA infections. Test performance is not FDA approved in patients less than 59 years old. Performed at [facility], [address] [place], [place] [address]     Labs: CBC: Recent Labs  Lab 10/19/22 1051 10/20/22 0535 10/21/22 0553 10/22/22 0545  WBC 5.2 4.3 4.7 4.7  NEUTROABS  --   --  2.2 2.2  HGB 11.2* 9.1* 9.7* 9.8*  HCT 35.3* 28.2* 30.0* 30.2*  MCV 93.1 91.0 90.9 90.7  PLT 155 103* 118* 126*   Basic Metabolic Panel: Recent Labs  Lab 10/19/22 1051 10/20/22 0535 10/21/22 0553 10/22/22 0545  NA 141 139 141 137  K 2.9* 3.6 4.1 3.9  CL 106 106 111 110  CO2 27 25 24 23   GLUCOSE 119* 78 82 84  BUN 9 6 6 7   CREATININE 0.63 0.53* 0.56*  0.56*  CALCIUM 8.4* 7.7* 7.8* 7.9*   Liver Function Tests: Recent Labs  Lab 10/19/22 1051 10/20/22 0535  AST 38 29  ALT 13 12  ALKPHOS 80 52  BILITOT 2.6* 2.1*  PROT 7.7 6.0*  ALBUMIN 2.4* 2.2*   CBG: Recent Labs  Lab 10/21/22 1153 10/21/22 1702 10/21/22 2147 10/22/22 0807 10/22/22 1150  GLUCAP 93 82 125* 87 100*    Discharge time spent: greater than 30 minutes.  Signed: Loyce Dys, MD [facility] 10/22/2022

## 2022-10-22 NOTE — Progress Notes (Signed)
Attempted to call report to [facility] [phone]. Operator transferred to nurses station, but no answer. Will leave unit contact number on AVS for questions for the receiving facility. EMS has been contacted for transport, second on the list.    Madie Reno, RN

## 2022-10-22 NOTE — TOC Progression Note (Addendum)
Transition of Care [facility]) - Progression Note    Patient Details  Name: Shaun Roberts MRN: [id number] Date of Birth: 01/27/1966  [facility]) CM/SW Contact  Margarito Liner, LCSW Phone Number: 10/22/2022, 12:15 PM  Clinical Narrative:  Per MD, patient is now considering return to SNF. CSW met with patient twice today and he is still undecided.   3:18 pm: Patient is agreeable to return to [facility].   Expected Discharge Plan:  (TBD) Barriers to Discharge: Continued Medical Work up  Expected Discharge Plan and Services     Post Acute Care Choice:  (TBD) Living arrangements for the past 2 months: Skilled Nursing Facility, Single Family Home                                       Social Determinants of Health (SDOH) Interventions SDOH Screenings   Food Insecurity: No Food Insecurity (10/19/2022)  Housing: Low Risk  (10/19/2022)  Transportation Needs: No Transportation Needs (10/19/2022)  Utilities: Not At Risk (10/19/2022)  Depression (PHQ2-9): Medium Risk (12/31/2019)  Tobacco Use: High Risk (10/19/2022)    Readmission Risk Interventions     No data to display

## 2022-10-22 NOTE — Progress Notes (Signed)
Physical Therapy Treatment Patient Details Name: Kirklin Barno MRN: [id number] DOB: [birth year] Today's Date: 10/22/2022   History of Present Illness Dewone Sunderman is a 57 y/o M with PMH of cirrhosis, COPD, ETOH abuse, DM2, orthostatic, depression, FTT. Admitted for paracentesis (5L taken off 5/21). Was d/c from [facility] on 4/11 to SNF rehab.    PT Comments    Pt required extra time and effort along with cuing for sequencing with bed mobility tasks and transfers from various height surfaces but required no physical assistance.  Pt was able to walk 30 feet and then 75 feet with a RW with therapeutic rest breaks between them.  Pt required frequent cuing for proper sequencing with the RW for decreased fall risk with poor carryover.  Pt reported no adverse symptoms during the session with SpO2 and HR WNL on room air.  Pt will benefit from continued PT services upon discharge to safely address deficits listed in patient problem list for decreased caregiver assistance and eventual return to PLOF.    Recommendations for follow up therapy are one component of a multi-disciplinary discharge planning process, led by the attending physician.  Recommendations may be updated based on patient status, additional functional criteria and insurance authorization.  Follow Up Recommendations  Can patient physically be transported by private vehicle: Yes    Assistance Recommended at Discharge Intermittent Supervision/Assistance  Patient can return home with the following A little help with walking and/or transfers;A little help with bathing/dressing/bathroom;Help with stairs or ramp for entrance   Equipment Recommendations  Rolling walker (2 wheels)    Recommendations for Other Services       Precautions / Restrictions Precautions Precautions: Fall Restrictions Weight Bearing Restrictions: No     Mobility  Bed Mobility Overal bed mobility: Modified Independent             General bed mobility  comments: Min extra time, effort, and use of bed rail only    Transfers Overall transfer level: Needs assistance Equipment used: Rolling walker (2 wheels) Transfers: Sit to/from Stand Sit to Stand: Min guard           General transfer comment: Min verbal cues for hand placement    Ambulation/Gait Ambulation/Gait assistance: Min guard Gait Distance (Feet): 75 Feet x 1, 30 Feet x 1  Assistive device: Rolling walker (2 wheels) Gait Pattern/deviations: Step-through pattern, Decreased step length - right, Trunk flexed, Decreased step length - left Gait velocity: decreased     General Gait Details: Mod verbal and visual cues for amb closer to the RW with upright posture   Stairs             Wheelchair Mobility    Modified Rankin (Stroke Patients Only)       Balance Overall balance assessment: Needs assistance Sitting-balance support: Feet supported Sitting balance-Leahy Scale: Good     Standing balance support: Bilateral upper extremity supported, Reliant on assistive device for balance, During functional activity Standing balance-Leahy Scale: Fair                              Cognition Arousal/Alertness: Awake/alert Behavior During Therapy: Flat affect Overall Cognitive Status: No family/caregiver present to determine baseline cognitive functioning                                          Exercises Other  Exercises Other Exercises: Static standing balance training without UE support Other Exercises: Dynamic standing balance training with reaching outside BOS    General Comments        Pertinent Vitals/Pain Pain Assessment Pain Assessment: No/denies pain    Home Living                          Prior Function            PT Goals (current goals can now be found in the care plan section) Progress towards PT goals: Progressing toward goals    Frequency    Min 3X/week      PT Plan Current plan  remains appropriate    Co-evaluation              AM-PAC PT "6 Clicks" Mobility   Outcome Measure  Help needed turning from your back to your side while in a flat bed without using bedrails?: None Help needed moving from lying on your back to sitting on the side of a flat bed without using bedrails?: None Help needed moving to and from a bed to a chair (including a wheelchair)?: A Little Help needed standing up from a chair using your arms (e.g., wheelchair or bedside chair)?: A Little Help needed to walk in hospital room?: A Little Help needed climbing 3-5 steps with a railing? : A Lot 6 Click Score: 19    End of Session Equipment Utilized During Treatment: Gait belt Activity Tolerance: Patient tolerated treatment well Patient left: in bed;with bed alarm set;with call bell/phone within reach (Pt declined up in chair) Nurse Communication: Mobility status PT Visit Diagnosis: Unsteadiness on feet (R26.81);Muscle weakness (generalized) (M62.81);Difficulty in walking, not elsewhere classified (R26.2)     Time: 1610-9604 PT Time Calculation (min) (ACUTE ONLY): 23 min  Charges:  $Gait Training: 8-22 mins $Therapeutic Exercise: 8-22 mins                     D. Scott Maisa Bedingfield PT, DPT 10/22/22, 10:53 AM

## 2022-10-22 NOTE — Plan of Care (Signed)
Adequate for discharge. Resolve plan of care  Shaun Roberts Shaun Roberts   

## 2022-10-22 NOTE — Progress Notes (Signed)
Patient discharged via EMS to [facility], papers with EMS, patient ambulated to the stretcher after using the restroom.

## 2022-10-22 NOTE — Plan of Care (Signed)
  Problem: Skin Integrity: Goal: Risk for impaired skin integrity will decrease Outcome: Progressing   Problem: Tissue Perfusion: Goal: Adequacy of tissue perfusion will improve Outcome: Progressing   Problem: Clinical Measurements: Goal: Respiratory complications will improve Outcome: Progressing Goal: Cardiovascular complication will be avoided Outcome: Progressing   Problem: Activity: Goal: Risk for activity intolerance will decrease Outcome: Progressing   Problem: Pain Managment: Goal: General experience of comfort will improve Outcome: Progressing

## 2022-10-23 LAB — BODY FLUID CULTURE W GRAM STAIN

## 2023-01-07 ENCOUNTER — Ambulatory Visit: Payer: Medicare Other | Admitting: Primary Care

## 2023-01-07 ENCOUNTER — Other Ambulatory Visit: Payer: Self-pay | Admitting: Primary Care

## 2023-01-07 ENCOUNTER — Encounter: Payer: Self-pay | Admitting: Primary Care

## 2023-01-07 VITALS — BP 118/72 | HR 91 | Temp 96.8°F | Ht 71.0 in | Wt 143.0 lb

## 2023-01-07 DIAGNOSIS — J449 Chronic obstructive pulmonary disease, unspecified: Secondary | ICD-10-CM

## 2023-01-07 DIAGNOSIS — E1165 Type 2 diabetes mellitus with hyperglycemia: Secondary | ICD-10-CM

## 2023-01-07 DIAGNOSIS — F1021 Alcohol dependence, in remission: Secondary | ICD-10-CM | POA: Diagnosis not present

## 2023-01-07 DIAGNOSIS — K5900 Constipation, unspecified: Secondary | ICD-10-CM

## 2023-01-07 DIAGNOSIS — E871 Hypo-osmolality and hyponatremia: Secondary | ICD-10-CM

## 2023-01-07 DIAGNOSIS — Z8719 Personal history of other diseases of the digestive system: Secondary | ICD-10-CM | POA: Diagnosis not present

## 2023-01-07 DIAGNOSIS — G4700 Insomnia, unspecified: Secondary | ICD-10-CM

## 2023-01-07 DIAGNOSIS — K7031 Alcoholic cirrhosis of liver with ascites: Secondary | ICD-10-CM | POA: Diagnosis not present

## 2023-01-07 LAB — CBC
HCT: 40.4 % (ref 39.0–52.0)
Hemoglobin: 13.2 g/dL (ref 13.0–17.0)
MCHC: 32.6 g/dL (ref 30.0–36.0)
MCV: 95.7 fl (ref 78.0–100.0)
Platelets: 160 10*3/uL (ref 150.0–400.0)
RBC: 4.22 Mil/uL (ref 4.22–5.81)
RDW: 15.7 % — ABNORMAL HIGH (ref 11.5–15.5)
WBC: 5.5 10*3/uL (ref 4.0–10.5)

## 2023-01-07 LAB — COMPREHENSIVE METABOLIC PANEL
ALT: 20 U/L (ref 0–53)
AST: 23 U/L (ref 0–37)
Albumin: 3.9 g/dL (ref 3.5–5.2)
Alkaline Phosphatase: 82 U/L (ref 39–117)
BUN: 12 mg/dL (ref 6–23)
CO2: 25 mEq/L (ref 19–32)
Calcium: 10.3 mg/dL (ref 8.4–10.5)
Chloride: 100 mEq/L (ref 96–112)
Creatinine, Ser: 0.66 mg/dL (ref 0.40–1.50)
GFR: 103.49 mL/min (ref >60.00)
Glucose, Bld: 173 mg/dL — ABNORMAL HIGH (ref 70–99)
Potassium: 4.2 mEq/L (ref 3.5–5.1)
Sodium: 133 mEq/L — ABNORMAL LOW (ref 135–145)
Total Bilirubin: 0.9 mg/dL (ref 0.2–1.2)
Total Protein: 8.3 g/dL (ref 6.0–8.3)

## 2023-01-07 LAB — HEMOGLOBIN A1C: Hgb A1c MFr Bld: 6 % (ref 4.6–6.5)

## 2023-01-07 MED ORDER — FOLIC ACID 1 MG PO TABS: 1.0000 mg | ORAL_TABLET | Freq: Every day | ORAL | 3 refills | Status: DC

## 2023-01-07 MED ORDER — SPIRONOLACTONE 50 MG PO TABS
50.0000 mg | ORAL_TABLET | Freq: Every day | ORAL | 0 refills | Status: DC
Start: 2023-01-07 — End: 2023-04-26

## 2023-01-07 MED ORDER — FUROSEMIDE 20 MG PO TABS: 20.0000 mg | ORAL_TABLET | Freq: Every day | ORAL | 0 refills | Status: DC | PRN

## 2023-01-07 MED ORDER — PANTOPRAZOLE SODIUM 40 MG PO TBEC: 40.0000 mg | DELAYED_RELEASE_TABLET | Freq: Two times a day (BID) | ORAL | 0 refills | Status: DC

## 2023-01-07 MED ORDER — ALBUTEROL SULFATE HFA 108 (90 BASE) MCG/ACT IN AERS
2.0000 | INHALATION_SPRAY | RESPIRATORY_TRACT | 0 refills | Status: AC | PRN
Start: 2023-01-07 — End: ?

## 2023-01-07 NOTE — Assessment & Plan Note (Signed)
Continue pantoprazole 40 mg twice daily. Refills provided.

## 2023-01-07 NOTE — Assessment & Plan Note (Signed)
Repeat A1c pending. Remain off diabetes treatment.

## 2023-01-07 NOTE — Patient Instructions (Addendum)
Stop by the lab prior to leaving today. I will notify you of your results once received.   Only take the furosemide 20 mg tablets if you notice fluid development in the abdomen or legs.  Continue spironolactone 50 mg daily for blood pressure.  Continue pantoprazole 40 mg twice daily for stomach ulcer prevention.  You may take trazodone 50 mg at bedtime for sleep.  Continue the folate vitamin and thiamine vitamin daily.  Use the albuterol inhaler. Inhale 2 puffs into the lungs every 4 to 6 hours as needed for wheezing, cough, and/or shortness of breath.  This is a rescue inhaler only.  You will receive a phone call regarding your GI appointment.  Please notify me if you do not hear from someone within 2 weeks.  Please schedule a follow up visit for 3 months.  It was a pleasure to see you today!

## 2023-01-07 NOTE — Assessment & Plan Note (Signed)
Controlled per patient.  Prescription for albuterol inhaler sent to pharmacy for him to use as needed. Consider LABA/ICS if warranted.

## 2023-01-07 NOTE — Assessment & Plan Note (Signed)
Start trazodone 50 mg at bedtime.

## 2023-01-07 NOTE — Assessment & Plan Note (Signed)
Continue Colace 100 mg twice daily. Encouraged good fiber and water intake.

## 2023-01-07 NOTE — Progress Notes (Signed)
Subjective:    Patient ID: Shaun Roberts, male    DOB: [birth year], 57 y.o.   MRN: [id number]  HPI  Shaun Roberts is a very pleasant 57 y.o. male with a history of COPD, type 2 diabetes, alcohol dependence, depression, protein calorie malnutrition, decubitus ulcer of right heel who presents today for hospital and post rehab follow-up.  He initially presented to [facility] ED on 10/19/2022 for abdominal pain and distention that had increased over the past few weeks.  He was noted to be hypertensive and tachycardic.  CT abdomen revealed hepatic cirrhosis with portal hypertension and ascites and varices.  His INR was 1.5 and albumin was 2.4.  He was admitted for further evaluation.  During his hospital stay he underwent paracentesis with removal of 5 L of fluid.  Acetic fluid analysis was negative for infection.  He was discharged to SNF on 10/22/2022 with recommendation for outpatient GI follow-up and PCP follow-up.  He was admitted to [facility] healthcare SNF on 10/22/22. During his stay he completed PT and OT. He was released home in late July. He lives by himself. He has been sober since 10/19/22. Overall he's feeling better. He is eating three meals daily with snacks in between. He is checking his BP at home which is running 130-140's/80's-90's.   He is compliant to his furosemide 20 mg daily, spironolactone 50 mg daily, fludrocortisone 0.1 mg daily, and folic acid 1 mg daily. He denies feeling dizzy with positional changes, chest pain, shortness of breath, GI bleeding, exertional dyspnea, abdominal distention. He has not been connected with GI.  He is not sleeping well.  He was prescribed trazodone 50 mg at bedtime while in SNF, he has not started this.    BP Readings from Last 3 Encounters:  01/07/23 118/72  10/22/22 102/70  09/09/22 93/73      Review of Systems  Constitutional:  Negative for appetite change.  Respiratory:  Negative for shortness of breath.   Cardiovascular:  Negative for  chest pain and leg swelling.  Gastrointestinal:  Positive for constipation. Negative for abdominal distention, nausea and vomiting.  Musculoskeletal:  Positive for arthralgias and back pain.  Neurological:  Negative for dizziness.  Psychiatric/Behavioral:  Positive for sleep disturbance.          Past Medical History:  Diagnosis Date   Acute blood loss anemia 09/04/2022   Acute deep vein thrombosis (DVT) of right popliteal vein (HCC) 08/11/2021   Alcohol abuse    Alcohol withdrawal (HCC) 04/13/2018   Arthritis    Colon polyps    COPD (chronic obstructive pulmonary disease) (HCC)    Depression    Diabetes mellitus without complication (HCC)    Hyperlipidemia    Hypertension     Social History   Socioeconomic History   Marital status: Divorced    Spouse name: Not on file   Number of children: Not on file   Years of education: Not on file   Highest education level: Not on file  Occupational History   Not on file  Tobacco Use   Smoking status: Every Day    Current packs/day: 1.50    Types: Cigarettes   Smokeless tobacco: Never  Substance and Sexual Activity   Alcohol use: Not Currently    Comment: Hx of alcoholism   Drug use: Not on file   Sexual activity: Not Currently  Other Topics Concern   Not on file  Social History Narrative   Not on file   Social Determinants of Health  Financial Resource Strain: Not on file  Food Insecurity: No Food Insecurity (10/19/2022)   Hunger Vital Sign    Worried About Running Out of Food in the Last Year: Never true    Ran Out of Food in the Last Year: Never true  Transportation Needs: No Transportation Needs (10/19/2022)   PRAPARE - Administrator, Civil Service (Medical): No    Lack of Transportation (Non-Medical): No  Physical Activity: Not on file  Stress: Not on file  Social Connections: Not on file  Intimate Partner Violence: Not At Risk (10/19/2022)   Humiliation, Afraid, Rape, and Kick questionnaire     Fear of Current or Ex-Partner: No    Emotionally Abused: No    Physically Abused: No    Sexually Abused: No    Past Surgical History:  Procedure Laterality Date   ANTERIOR CRUCIATE LIGAMENT REPAIR Right 2016   COLONOSCOPY N/A 09/08/2022   Procedure: COLONOSCOPY;  Surgeon: Toledo, Boykin Nearing, MD;  Location: [facility] ENDOSCOPY;  Service: Gastroenterology;  Laterality: N/A;   COLONOSCOPY WITH PROPOFOL N/A 05/18/2018   Procedure: COLONOSCOPY WITH BIOPSIES;  Surgeon: Midge Minium, MD;  Location: [facility];  Service: Endoscopy;  Laterality: N/A;   ESOPHAGOGASTRODUODENOSCOPY N/A 09/08/2022   Procedure: ESOPHAGOGASTRODUODENOSCOPY (EGD);  Surgeon: Toledo, Boykin Nearing, MD;  Location: [facility] ENDOSCOPY;  Service: Gastroenterology;  Laterality: N/A;   KNEE RECONSTRUCTION     MEDIAL COLLATERAL LIGAMENT AND LATERAL COLLATERAL LIGAMENT REPAIR, KNEE Right 2016   POLYPECTOMY N/A 05/18/2018   Procedure: POLYPECTOMY;  Surgeon: Midge Minium, MD;  Location: [facility];  Service: Endoscopy;  Laterality: N/A;    Family History  Problem Relation Age of Onset   Depression Mother    Diabetes Mother    Early death Mother    Kidney disease Mother    HIV/AIDS Mother    Alcohol abuse Father    Diabetes Father    Early death Father    Leukemia Father    Depression Sister    Diabetes Sister    Drug abuse Sister    Early death Sister    Cancer Sister    Early death Paternal Grandfather    Heart attack Paternal Grandfather    Heart disease Paternal Grandfather     No Known Allergies  Current Outpatient Medications on File Prior to Visit  Medication Sig Dispense Refill   acetaminophen (TYLENOL) 650 MG CR tablet Take 650 mg by mouth every 8 (eight) hours as needed for pain.     Multiple Vitamin (MULTIVITAMIN WITH MINERALS) TABS tablet Take 1 tablet by mouth daily.     No current facility-administered medications on file prior to visit.    BP 118/72 (BP Location: Right Arm, Patient Position:  Sitting, Cuff Size: Normal)   Pulse 91   Temp (!) 96.8 F (36 C)   Ht 5\' 11"  (1.803 m)   Wt 143 lb (64.9 kg)   SpO2 100%   BMI 19.94 kg/m  Objective:   Physical Exam Cardiovascular:     Rate and Rhythm: Normal rate and regular rhythm.  Pulmonary:     Effort: Pulmonary effort is normal.     Breath sounds: Normal breath sounds. No wheezing or rales.  Abdominal:     General: Bowel sounds are normal. There is no distension.     Palpations: Abdomen is soft.     Tenderness: There is no abdominal tenderness.  Musculoskeletal:     Cervical back: Neck supple.  Skin:    General: Skin  is warm and dry.  Neurological:     Mental Status: He is alert and oriented to person, place, and time.  Psychiatric:        Mood and Affect: Mood normal.           Assessment & Plan:  Alcoholic cirrhosis of liver with ascites [facility]) Assessment & Plan: Recent hospitalization and SNF rehab admission. Hospital notes, labs, imaging reviewed.  Referral placed to GI for ongoing management.  Change furosemide 20 mg to as needed.  Discussed when to use with patient. Continue spironolactone 50 mg daily.  Discontinue fludrocortisone as he does not appear to be orthostatic.  Labs pending.  Orders: -     Folic Acid; Take 1 tablet (1 mg total) by mouth daily.  Dispense: 90 tablet; Refill: 3 -     Spironolactone; Take 1 tablet (50 mg total) by mouth daily. for blood pressure.  Dispense: 90 tablet; Refill: 0 -     Ambulatory referral to Gastroenterology -     Comprehensive metabolic panel -     CBC  Controlled type 2 diabetes mellitus with hyperglycemia, without long-term current use of insulin (HCC) Assessment & Plan: Repeat A1c pending. Remain off diabetes treatment.  Orders: -     Hemoglobin A1c  Alcohol dependence in early full remission [facility]) Assessment & Plan: Commended him on sobriety.  Encouraged to continue.  Referral placed to GI for management of cirrhosis.  Orders: -      Furosemide; Take 1 tablet (20 mg total) by mouth daily as needed. For fluid retention  Dispense: 90 tablet; Refill: 0 -     Pantoprazole Sodium; Take 1 tablet (40 mg total) by mouth 2 (two) times daily. For ulcer prevention  Dispense: 180 tablet; Refill: 0  History of GI bleed Assessment & Plan: Continue pantoprazole 40 mg twice daily. Refills provided.  Orders: -     Pantoprazole Sodium; Take 1 tablet (40 mg total) by mouth 2 (two) times daily. For ulcer prevention  Dispense: 180 tablet; Refill: 0  Chronic obstructive pulmonary disease, unspecified COPD type (HCC) Assessment & Plan: Controlled per patient.  Prescription for albuterol inhaler sent to pharmacy for him to use as needed. Consider LABA/ICS if warranted.  Orders: -     Albuterol Sulfate HFA; Inhale 2 puffs into the lungs every 4 (four) hours as needed for shortness of breath.  Dispense: 1 each; Refill: 0  Constipation, unspecified constipation type Assessment & Plan: Continue Colace 100 mg twice daily. Encouraged good fiber and water intake.   Insomnia, unspecified type Assessment & Plan: Start trazodone 50 mg at bedtime.         Doreene Nest, NP

## 2023-01-07 NOTE — Assessment & Plan Note (Signed)
Commended him on sobriety.  Encouraged to continue.  Referral placed to GI for management of cirrhosis.

## 2023-01-07 NOTE — Assessment & Plan Note (Signed)
Recent hospitalization and SNF rehab admission. Hospital notes, labs, imaging reviewed.  Referral placed to GI for ongoing management.  Change furosemide 20 mg to as needed.  Discussed when to use with patient. Continue spironolactone 50 mg daily.  Discontinue fludrocortisone as he does not appear to be orthostatic.  Labs pending.

## 2023-01-11 ENCOUNTER — Other Ambulatory Visit: Payer: Self-pay | Admitting: Primary Care

## 2023-01-11 DIAGNOSIS — K7031 Alcoholic cirrhosis of liver with ascites: Secondary | ICD-10-CM

## 2023-01-11 DIAGNOSIS — Z8719 Personal history of other diseases of the digestive system: Secondary | ICD-10-CM

## 2023-01-12 ENCOUNTER — Other Ambulatory Visit: Payer: Self-pay | Admitting: Primary Care

## 2023-01-12 ENCOUNTER — Encounter: Payer: Self-pay | Admitting: *Deleted

## 2023-01-17 ENCOUNTER — Other Ambulatory Visit (INDEPENDENT_AMBULATORY_CARE_PROVIDER_SITE_OTHER): Payer: Medicare Other

## 2023-01-17 DIAGNOSIS — E871 Hypo-osmolality and hyponatremia: Secondary | ICD-10-CM

## 2023-01-18 ENCOUNTER — Other Ambulatory Visit: Payer: Self-pay | Admitting: Primary Care

## 2023-01-18 DIAGNOSIS — Z8719 Personal history of other diseases of the digestive system: Secondary | ICD-10-CM

## 2023-01-18 DIAGNOSIS — J449 Chronic obstructive pulmonary disease, unspecified: Secondary | ICD-10-CM

## 2023-01-18 DIAGNOSIS — F1021 Alcohol dependence, in remission: Secondary | ICD-10-CM

## 2023-01-18 LAB — BASIC METABOLIC PANEL
BUN: 11 mg/dL (ref 6–23)
CO2: 26 mEq/L (ref 19–32)
Calcium: 9.7 mg/dL (ref 8.4–10.5)
Chloride: 103 meq/L (ref 96–112)
Creatinine, Ser: 0.79 mg/dL (ref 0.40–1.50)
GFR: 98 mL/min (ref >60.00)
Glucose, Bld: 274 mg/dL — ABNORMAL HIGH (ref 70–99)
Potassium: 5 meq/L (ref 3.5–5.1)
Sodium: 136 mEq/L (ref 135–145)

## 2023-01-21 ENCOUNTER — Other Ambulatory Visit: Payer: Medicare Other

## 2023-01-26 ENCOUNTER — Telehealth: Payer: Self-pay | Admitting: Primary Care

## 2023-01-26 NOTE — Telephone Encounter (Signed)
Prescription Request  01/26/2023  LOV: 01/07/2023  What is the name of the medication or equipment? folic acid (FOLVITE) 1 MG tablet, furosemide (LASIX) 20 MG tablet, pantoprazole (PROTONIX) 40 MG tablet & spironolactone (ALDACTONE) 50 MG tablet   Have you contacted your pharmacy to request a refill? No   Which pharmacy would you like this sent to? [facility] - [place], [place] - [address]    Patient notified that their request is being sent to the clinical staff for review and that they should receive a response within 2 business days.   Please advise at Mobile [phone] (mobile)

## 2023-01-27 NOTE — Telephone Encounter (Signed)
Called and spoke with patient advised these prescriptions were sent to [facility] in [place] on 01/07/23. He will pick up the Rx from this time and would like further refills sent to [facility]. Advised him to let us know when he is in need of [facility]ill [facility] to [facility].

## 2023-02-02 ENCOUNTER — Telehealth: Payer: Self-pay | Admitting: Primary Care

## 2023-02-02 DIAGNOSIS — G4700 Insomnia, unspecified: Secondary | ICD-10-CM

## 2023-02-02 MED ORDER — TRAZODONE HCL 50 MG PO TABS
50.0000 mg | ORAL_TABLET | Freq: Every day | ORAL | 1 refills | Status: AC
Start: 2023-02-02 — End: ?

## 2023-02-02 NOTE — Telephone Encounter (Signed)
Patient would like to know if he could have a rx for trazodone sent in for him?He said that he was prescribed this while inn the hospital to help him sleep?  [facility] - [place], [place] - [address] Phone: [phone]  Fax: [phone]

## 2023-02-02 NOTE — Telephone Encounter (Signed)
Please call patient:  Notify patient that I will send a prescription for trazodone 50 mg tablets to his pharmacy for him to take at bedtime for sleep.

## 2023-02-03 NOTE — Telephone Encounter (Signed)
Called patient and reviewed all information. Patient verbalized understanding. Will call if any further questions.  

## 2023-04-05 ENCOUNTER — Other Ambulatory Visit: Payer: Self-pay | Admitting: Primary Care

## 2023-04-05 DIAGNOSIS — F1021 Alcohol dependence, in remission: Secondary | ICD-10-CM

## 2023-04-07 ENCOUNTER — Ambulatory Visit (INDEPENDENT_AMBULATORY_CARE_PROVIDER_SITE_OTHER): Payer: Medicare Other | Admitting: Primary Care

## 2023-04-07 VITALS — BP 110/60 | HR 90 | Temp 98.6°F | Ht 71.0 in | Wt 143.0 lb

## 2023-04-07 DIAGNOSIS — E1165 Type 2 diabetes mellitus with hyperglycemia: Secondary | ICD-10-CM

## 2023-04-07 DIAGNOSIS — M79604 Pain in right leg: Secondary | ICD-10-CM

## 2023-04-07 DIAGNOSIS — K7460 Unspecified cirrhosis of liver: Secondary | ICD-10-CM

## 2023-04-07 DIAGNOSIS — Z23 Encounter for immunization: Secondary | ICD-10-CM | POA: Diagnosis not present

## 2023-04-07 DIAGNOSIS — G8929 Other chronic pain: Secondary | ICD-10-CM

## 2023-04-07 DIAGNOSIS — G4700 Insomnia, unspecified: Secondary | ICD-10-CM

## 2023-04-07 DIAGNOSIS — M79605 Pain in left leg: Secondary | ICD-10-CM

## 2023-04-07 MED ORDER — GABAPENTIN 100 MG PO CAPS
100.0000 mg | ORAL_CAPSULE | Freq: Every day | ORAL | 0 refills | Status: DC
Start: 2023-04-07 — End: 2023-06-21

## 2023-04-07 NOTE — Assessment & Plan Note (Signed)
Likely a combination of osteoarthritis and neuropathy.  Symptoms not suggestive of claudication, although he is a prior smoker.  Offered further evaluation for chronic arthritis, he declines. Cautioned against too much Tylenol use given cirrhosis.  Start gabapentin 100 mg at bedtime. He will update.

## 2023-04-07 NOTE — Patient Instructions (Signed)
Start gabapentin 100 mg for leg pain and restless legs.  Take 1 tablet by mouth every evening at bedtime.  This may cause drowsiness.  Follow-up with GI as scheduled.  Please schedule a follow up visit for 3 months for diabetes check.  It was a pleasure to see you today!

## 2023-04-07 NOTE — Assessment & Plan Note (Signed)
Improved. ? ?Continue trazodone 50 mg at bedtime. ?

## 2023-04-07 NOTE — Assessment & Plan Note (Signed)
Stable.  Follow-up with GI as scheduled. Continue spironolactone 50 mg daily, pantoprazole 40 mg twice daily, furosemide 20 mg if needed.

## 2023-04-07 NOTE — Addendum Note (Signed)
Addended by: Alvina Chou on: 04/07/2023 09:28 AM   Modules accepted: Orders

## 2023-04-07 NOTE — Progress Notes (Signed)
Subjective:    Patient ID: Shaun Roberts, male    DOB: 1966-02-24, 57 y.o.   MRN: [id number]  HPI  Shaun Roberts is a very pleasant 57 y.o. male with a history of orthostatic hypotension, COPD, controlled type 2 diabetes, thrombocytopenia, alcohol dependence, tobacco use, MDD, protein calorie malnutrition, cirrhosis who presents today for follow-up of chronic conditions.  1) Hepatic Cirrhosis: Hospitalization with SNF rehab admission in July 2024.  Evaluated in August 2024 in our office, furosemide milligrams was changed to as needed, spironolactone 50 mg daily and pantoprazole twice daily were continued.  Fludrocortisone was discontinued as he was no longer orthostatic.  Referral placed to GI for further management.  During this visit he had refrain from alcohol use.   Since his last visit he's not had to take furosemide, denies lower extremity edema and abdominal distention. He is compliant to spironolactone 50 mg.    He denies rectal bleeding. He has resumed drinking, had two "binge sessions" one lasting 3 days and one lasting 4 days. Vodka and beer.  He has an appointment scheduled with GI in January 2025.  2) Insomnia: Chronic.  Last evaluated in the office in August 2024, trazodone 50 mg was initiated. Today he mentions doing mostly well with Trazodone, does continue to have trouble falling asleep. He watches TV prior to bedtime. He drinks coffee with caffeine throughout the day, last cup is typically around 6:30 pm.   3) Lower Extremity Pain: Chronic for years to bilateral lower extremities from both knees down to the feet. He will also notice veins engorged during the day and night. History of right knee surgeries x 5 for reconstructive surgery. Also with "pins and needles" with aching in the feet both plantar and dorsal feet.   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  Very physical labor.  He denies swelling to the knee or ankle joints, calf  pain when walking. He's taking Tylenol Arthritis twice daily which does help some. Constantly having to move his legs around when laying down at night to feel comfortable.   Review of Systems  Respiratory:  Negative for shortness of breath.   Cardiovascular:  Negative for chest pain and leg swelling.  Gastrointestinal:  Negative for abdominal distention.  Musculoskeletal:  Positive for arthralgias. Negative for joint swelling.  Neurological:  Negative for dizziness.         Past Medical History:  Diagnosis Date   Acute blood loss anemia 09/04/2022   Acute deep vein thrombosis (DVT) of right popliteal vein (HCC) 08/11/2021   Alcohol abuse    Alcohol withdrawal (HCC) 04/13/2018   Arthritis    Colon polyps    COPD (chronic obstructive pulmonary disease) (HCC)    Depression    Diabetes mellitus without complication (HCC)    Hyperlipidemia    Hypertension     Social History   Socioeconomic History   Marital status: Divorced    Spouse name: Not on file   Number of children: Not on file   Years of education: Not on file   Highest education level: Not on file  Occupational History   Not on file  Tobacco Use   Smoking status: Every Day    Current packs/day: 1.50    Types: Cigarettes   Smokeless tobacco: Never  Substance and Sexual Activity   Alcohol use: Not Currently    Comment: Hx of alcoholism   Drug use: Not on file   Sexual activity: Not Currently  Other Topics Concern   Not on file  Social History Narrative   Not on file   Social Determinants of Health   Financial Resource Strain: Not on file  Food Insecurity: No Food Insecurity (10/19/2022)   Hunger Vital Sign    Worried About Running Out of Food in the Last Year: Never true    Ran Out of Food in the Last Year: Never true  Transportation Needs: No Transportation Needs (10/19/2022)   PRAPARE - Administrator, Civil Service (Medical): No    Lack of Transportation (Non-Medical): No  Physical  Activity: Not on file  Stress: Not on file  Social Connections: Not on file  Intimate Partner Violence: Not At Risk (10/19/2022)   Humiliation, Afraid, Rape, and Kick questionnaire    Fear of Current or Ex-Partner: No    Emotionally Abused: No    Physically Abused: No    Sexually Abused: No    Past Surgical History:  Procedure Laterality Date   ANTERIOR CRUCIATE LIGAMENT REPAIR Right 2016   COLONOSCOPY N/A 09/08/2022   Procedure: COLONOSCOPY;  Surgeon: Toledo, Boykin Nearing, MD;  Location: [facility] ENDOSCOPY;  Service: Gastroenterology;  Laterality: N/A;   COLONOSCOPY WITH PROPOFOL N/A 05/18/2018   Procedure: COLONOSCOPY WITH BIOPSIES;  Surgeon: Midge Minium, MD;  [facility];  Service: Endoscopy;  Laterality: N/A;   ESOPHAGOGASTRODUODENOSCOPY N/A 09/08/2022   Procedure: ESOPHAGOGASTRODUODENOSCOPY (EGD);  Surgeon: Toledo, Boykin Nearing, MD;  Location: [facility] ENDOSCOPY;  Service: Gastroenterology;  Laterality: N/A;   KNEE RECONSTRUCTION     MEDIAL COLLATERAL LIGAMENT AND LATERAL COLLATERAL LIGAMENT REPAIR, KNEE Right 2016   POLYPECTOMY N/A 05/18/2018   Procedure: POLYPECTOMY;  Surgeon: Midge Minium, MD;  Location: [facility];  Service: Endoscopy;  Laterality: N/A;    Family History  Problem Relation Age of Onset   Depression Mother    Diabetes Mother    Early death Mother    Kidney disease Mother    HIV/AIDS Mother    Alcohol abuse Father    Diabetes Father    Early death Father    Leukemia Father    Depression Sister    Diabetes Sister    Drug abuse Sister    Early death Sister    Cancer Sister    Early death Paternal Grandfather    Heart attack Paternal Grandfather    Heart disease Paternal Grandfather     No Known Allergies  Current Outpatient Medications on File Prior to Visit  Medication Sig Dispense Refill   acetaminophen (TYLENOL) 650 MG CR tablet Take 650 mg by mouth every 8 (eight) hours as needed for pain.     albuterol (VENTOLIN HFA) 108 (90  Base) MCG/ACT inhaler Inhale 2 puffs into the lungs every 4 (four) hours as needed for shortness of breath. 1 each 0   folic acid (FOLVITE) 1 MG tablet Take 1 tablet (1 mg total) by mouth daily. 90 tablet 3   pantoprazole (PROTONIX) 40 MG tablet Take 1 tablet (40 mg total) by mouth 2 (two) times daily. For ulcer prevention 180 tablet 0   spironolactone (ALDACTONE) 50 MG tablet Take 1 tablet (50 mg total) by mouth daily. for blood pressure. 90 tablet 0   thiamine (VITAMIN B-1) 50 MG tablet Take 50 mg by mouth daily.     traZODone (DESYREL) 50 MG tablet Take 1 tablet (50 mg total) by mouth at bedtime. For sleep 90 tablet 1   furosemide (LASIX) 20 MG tablet Take 1 tablet (20 mg  total) by mouth daily as needed. For fluid retention (Patient not taking: Reported on 04/07/2023) 90 tablet 0   Multiple Vitamin (MULTIVITAMIN WITH MINERALS) TABS tablet Take 1 tablet by mouth daily. (Patient not taking: Reported on 04/07/2023)     No current facility-administered medications on file prior to visit.    BP 110/60   Pulse 90   Temp 98.6 F (37 C) (Temporal)   Ht 5\' 11"  (1.803 m)   Wt 143 lb (64.9 kg)   SpO2 99%   BMI 19.94 kg/m  Objective:   Physical Exam Cardiovascular:     Rate and Rhythm: Normal rate and regular rhythm.  Pulmonary:     Effort: Pulmonary effort is normal.     Breath sounds: Normal breath sounds.  Musculoskeletal:     Cervical back: Neck supple.  Skin:    General: Skin is warm and dry.  Neurological:     Mental Status: He is alert and oriented to person, place, and time.  Psychiatric:        Mood and Affect: Mood normal.           Assessment & Plan:  Cirrhosis of liver without ascites, unspecified hepatic cirrhosis type (HCC) Assessment & Plan: Stable.  Follow-up with GI as scheduled. Continue spironolactone 50 mg daily, pantoprazole 40 mg twice daily, furosemide 20 mg if needed.   Insomnia, unspecified type Assessment & Plan: Improved.  Continue trazodone 50  mg at bedtime.   Controlled type 2 diabetes mellitus with hyperglycemia, without long-term current use of insulin (HCC) -     Microalbumin / creatinine urine ratio  Chronic pain of lower extremity, bilateral Assessment & Plan: Likely a combination of osteoarthritis and neuropathy.  Symptoms not suggestive of claudication, although he is a prior smoker.  Offered further evaluation for chronic arthritis, he declines. Cautioned against too much Tylenol use given cirrhosis.  Start gabapentin 100 mg at bedtime. He will update.  Orders: -     Gabapentin; Take 1 capsule (100 mg total) by mouth at bedtime. For leg pain  Dispense: 90 capsule; Refill: 0        Doreene Nest, NP

## 2023-04-12 ENCOUNTER — Ambulatory Visit: Payer: Medicare Other | Admitting: Primary Care

## 2023-04-26 ENCOUNTER — Other Ambulatory Visit: Payer: Self-pay | Admitting: Primary Care

## 2023-04-26 DIAGNOSIS — K7031 Alcoholic cirrhosis of liver with ascites: Secondary | ICD-10-CM

## 2023-05-04 ENCOUNTER — Telehealth: Payer: Self-pay | Admitting: Primary Care

## 2023-05-04 DIAGNOSIS — F1021 Alcohol dependence, in remission: Secondary | ICD-10-CM

## 2023-05-04 DIAGNOSIS — Z8719 Personal history of other diseases of the digestive system: Secondary | ICD-10-CM

## 2023-05-04 MED ORDER — PANTOPRAZOLE SODIUM 40 MG PO TBEC: 40.0000 mg | DELAYED_RELEASE_TABLET | Freq: Two times a day (BID) | ORAL | 0 refills | Status: DC

## 2023-05-04 NOTE — Telephone Encounter (Signed)
Noted  

## 2023-05-04 NOTE — Telephone Encounter (Signed)
Called patient, he does have plenty of medication at home for both meds. He will let pharmacy know when he is 2 weeks from running out.

## 2023-05-04 NOTE — Telephone Encounter (Signed)
Prescription Request  05/04/2023  LOV: 04/07/2023  What is the name of the medication or equipment? pantoprazole (PROTONIX) 40 MG tablet   Have you contacted your pharmacy to request a refill? No   Which pharmacy would you like this sent to?  [facility] - [place], [place] - [address] [place] [address] Phone: [phone] Fax: [phone]    Patient notified that their request is being sent to the clinical staff for review and that they should receive a response within 2 business days.   Please advise at Mobile [phone] (mobile)  Patient asked for all refills of medications to be sent to [facility] pharmacy now, requested spironolactone (ALDACTONE) 50 MG tablet folic acid (FOLVITE) 1 MG tablet to be [facility] to [facility] as well

## 2023-05-04 NOTE — Telephone Encounter (Signed)
Does he have spironolactone and folic acid on hand at home right now?  How much? If he has plenty now, then have him call us when he is about 2 weeks from running low we will send these prescriptions [facility].

## 2023-05-05 ENCOUNTER — Other Ambulatory Visit: Payer: Self-pay | Admitting: Primary Care

## 2023-05-05 DIAGNOSIS — G4700 Insomnia, unspecified: Secondary | ICD-10-CM

## 2023-05-10 ENCOUNTER — Telehealth: Payer: Self-pay | Admitting: Primary Care

## 2023-05-10 DIAGNOSIS — K7031 Alcoholic cirrhosis of liver with ascites: Secondary | ICD-10-CM

## 2023-05-10 MED ORDER — SPIRONOLACTONE 50 MG PO TABS: 50.0000 mg | ORAL_TABLET | Freq: Every day | ORAL | 0 refills | Status: DC

## 2023-05-10 MED ORDER — FOLIC ACID 1 MG PO TABS: 1.0000 mg | ORAL_TABLET | Freq: Every day | ORAL | 0 refills | Status: DC

## 2023-05-10 NOTE — Telephone Encounter (Signed)
Prescriptions sent to [facility]. See phone note dated 05/04/23 regarding switching pharmacies.

## 2023-05-10 NOTE — Telephone Encounter (Signed)
Prescription Request  05/10/2023  LOV: 04/07/2023  What is the name of the medication or equipment? folic acid (FOLVITE) 1 MG tablet  & spironolactone (ALDACTONE) 50 MG tablet   Have you contacted your pharmacy to request a refill? Yes   Which pharmacy would you like this sent to?  [facility] - [place], [place] - [address] [place] [address] Phone: [phone] Fax: [phone]    Patient notified that their request is being sent to the clinical staff for review and that they should receive a response within 2 business days.   Please advise at Mobile [phone] (mobile)

## 2023-06-15 ENCOUNTER — Ambulatory Visit (INDEPENDENT_AMBULATORY_CARE_PROVIDER_SITE_OTHER): Payer: Medicare Other | Admitting: Primary Care

## 2023-06-15 ENCOUNTER — Encounter: Payer: Self-pay | Admitting: Primary Care

## 2023-06-15 VITALS — BP 148/82 | HR 117 | Temp 99.0°F | Ht 71.0 in | Wt 139.0 lb

## 2023-06-15 DIAGNOSIS — M79605 Pain in left leg: Secondary | ICD-10-CM | POA: Diagnosis not present

## 2023-06-15 DIAGNOSIS — M79604 Pain in right leg: Secondary | ICD-10-CM

## 2023-06-15 DIAGNOSIS — G8929 Other chronic pain: Secondary | ICD-10-CM | POA: Diagnosis not present

## 2023-06-15 DIAGNOSIS — H9313 Tinnitus, bilateral: Secondary | ICD-10-CM

## 2023-06-15 NOTE — Assessment & Plan Note (Signed)
 Completed handicap placard form today.  Okay to increase gabapentin  to 100 mg BID. He will update when Rx is running low.

## 2023-06-15 NOTE — Progress Notes (Signed)
 Subjective:    Patient ID: Shaun Roberts, male    DOB: 28-Oct-1965, 58 y.o.   MRN: [id number]  HPI  Shaun Roberts is a very pleasant 58 y.o. male with a history of COPD, hepatic cirrhosis, controlled type 2 diabetes, AKI, orthostatic hypotension, alcohol abuse, tobacco abuse, thrombocytopenia, GI bleed who presents today to discuss several concerns.  1) Tinnitus: Chronic to bilateral ears for years. Constant. 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 The truck had a freezer and refrigerator section which made a lot of noise in the trailer and in the truck cab.   He tried an OTC product "Ear Pain MD" once without improvement. His tinnitus is no louder or worse than usual. He denies hearing loss.   He would like to see ENT.   2) Handicap Placard Request: Today he is requesting a handicap placard due to chronic bilateral lower extremity pain, chronic right knee pain. History of 5 surgeries to the right knee. Because of this, he is unable to walk longer than 200 feet without resting.   He is managed on gabapentin  100 mg HS for leg pain which helps. He questions if he can take 100 mg in AM and 100 mg HS.    Review of Systems  HENT:  Positive for tinnitus. Negative for ear pain.   Musculoskeletal:  Positive for arthralgias.  Neurological:  Negative for dizziness.         Past Medical History:  Diagnosis Date   Acute blood loss anemia 09/04/2022   Acute deep vein thrombosis (DVT) of right popliteal vein (HCC) 08/11/2021   Alcohol abuse    Alcohol withdrawal (HCC) 04/13/2018   Arthritis    Colon polyps    COPD (chronic obstructive pulmonary disease) (HCC)    Depression    Diabetes mellitus without complication (HCC)    Hyperlipidemia    Hypertension     Social History   Socioeconomic History   Marital status: Divorced    Spouse name: Not on file   Number of children: Not on file   Years of education: Not on file   Highest education  level: Not on file  Occupational History   Not on file  Tobacco Use   Smoking status: Every Day    Current packs/day: 1.50    Types: Cigarettes   Smokeless tobacco: Never  Substance and Sexual Activity   Alcohol use: Not Currently    Comment: Hx of alcoholism   Drug use: Not on file   Sexual activity: Not Currently  Other Topics Concern   Not on file  Social History Narrative   Not on file   Social Drivers of Health   Financial Resource Strain: Not on file  Food Insecurity: No Food Insecurity (10/19/2022)   Hunger Vital Sign    Worried About Running Out of Food in the Last Year: Never true    Ran Out of Food in the Last Year: Never true  Transportation Needs: No Transportation Needs (10/19/2022)   PRAPARE - Administrator, Civil Service (Medical): No    Lack of Transportation (Non-Medical): No  Physical Activity: Not on file  Stress: Not on file  Social Connections: Not on file  Intimate Partner Violence: Not At Risk (10/19/2022)   Humiliation, Afraid, Rape, and Kick questionnaire    Fear of Current or Ex-Partner: No    Emotionally Abused: No    Physically Abused: No    Sexually Abused: No  Past Surgical History:  Procedure Laterality Date   ANTERIOR CRUCIATE LIGAMENT REPAIR Right 2016   COLONOSCOPY N/A 09/08/2022   Procedure: COLONOSCOPY;  Surgeon: Toledo, Alphonsus Jeans, MD;  Location: [facility];  Service: Gastroenterology;  Laterality: N/A;   COLONOSCOPY WITH PROPOFOL  N/A 05/18/2018   Procedure: COLONOSCOPY WITH BIOPSIES;  Surgeon: Marnee Sink, MD;  Location: [facility];  Service: Endoscopy;  Laterality: N/A;   ESOPHAGOGASTRODUODENOSCOPY N/A 09/08/2022   Procedure: ESOPHAGOGASTRODUODENOSCOPY (EGD);  Surgeon: Toledo, Alphonsus Jeans, MD;  Location: [facility];  Service: Gastroenterology;  Laterality: N/A;   KNEE RECONSTRUCTION     MEDIAL COLLATERAL LIGAMENT AND LATERAL COLLATERAL LIGAMENT REPAIR, KNEE Right 2016   POLYPECTOMY N/A 05/18/2018    Procedure: POLYPECTOMY;  Surgeon: Marnee Sink, MD;  Location: [facility];  Service: Endoscopy;  Laterality: N/A;    Family History  Problem Relation Age of Onset   Depression Mother    Diabetes Mother    Early death Mother    Kidney disease Mother    HIV/AIDS Mother    Alcohol abuse Father    Diabetes Father    Early death Father    Leukemia Father    Depression Sister    Diabetes Sister    Drug abuse Sister    Early death Sister    Cancer Sister    Early death Paternal Grandfather    Heart attack Paternal Grandfather    Heart disease Paternal Grandfather     No Known Allergies  Current Outpatient Medications on File Prior to Visit  Medication Sig Dispense Refill   acetaminophen  (TYLENOL ) 650 MG CR tablet Take 650 mg by mouth every 8 (eight) hours as needed for pain.     folic acid  (FOLVITE ) 1 MG tablet Take 1 tablet (1 mg total) by mouth daily. 90 tablet 0   gabapentin  (NEURONTIN ) 100 MG capsule Take 1 capsule (100 mg total) by mouth at bedtime. For leg pain 90 capsule 0   Multiple Vitamin (MULTIVITAMIN WITH MINERALS) TABS tablet Take 1 tablet by mouth daily.     pantoprazole  (PROTONIX ) 40 MG tablet Take 1 tablet (40 mg total) by mouth 2 (two) times daily. For ulcer prevention 180 tablet 0   spironolactone  (ALDACTONE ) 50 MG tablet Take 1 tablet (50 mg total) by mouth daily. for blood pressure. 90 tablet 0   thiamine  (VITAMIN B-1) 50 MG tablet Take 50 mg by mouth daily.     traZODone  (DESYREL ) 50 MG tablet Take 1 tablet (50 mg total) by mouth at bedtime. For sleep 90 tablet 1   albuterol  (VENTOLIN  HFA) 108 (90 Base) MCG/ACT inhaler Inhale 2 puffs into the lungs every 4 (four) hours as needed for shortness of breath. (Patient not taking: Reported on 06/15/2023) 1 each 0   furosemide  (LASIX ) 20 MG tablet Take 1 tablet (20 mg total) by mouth daily as needed. For fluid retention (Patient not taking: Reported on 06/15/2023) 90 tablet 0   No current facility-administered  medications on file prior to visit.    BP (!) 148/82   Pulse (!) 117   Temp 99 F (37.2 C) (Temporal)   Ht 5\' 11"  (1.803 m)   Wt 139 lb (63 kg)   SpO2 99%   BMI 19.39 kg/m  Objective:   Physical Exam HENT:     Right Ear: Tympanic membrane and ear canal normal. There is no impacted cerumen.     Left Ear: Tympanic membrane and ear canal normal. There is no impacted cerumen.  Cardiovascular:  Rate and Rhythm: Normal rate and regular rhythm.  Pulmonary:     Effort: Pulmonary effort is normal.     Breath sounds: Normal breath sounds.  Musculoskeletal:     Cervical back: Neck supple.  Skin:    General: Skin is warm and dry.  Neurological:     Mental Status: He is alert and oriented to person, place, and time.  Psychiatric:        Mood and Affect: Mood normal.           Assessment & Plan:  Tinnitus of both ears Assessment & Plan: Likely secondary to chronic loud noise exposure.  Exam today overall benign.  Referral placed to ENT for hearing assessment and evaluation. Discussed use of Flonase for ear fullness.    Orders: -     Ambulatory referral to ENT  Chronic pain of lower extremity, bilateral Assessment & Plan: Completed handicap placard form today.  Okay to increase gabapentin  to 100 mg BID. He will update when Rx is running low.         Celita Aron K Everlie Eble, NP

## 2023-06-15 NOTE — Assessment & Plan Note (Signed)
 Likely secondary to chronic loud noise exposure.  Exam today overall benign.  Referral placed to ENT for hearing assessment and evaluation. Discussed use of Flonase for ear fullness.

## 2023-06-15 NOTE — Patient Instructions (Signed)
 You may increase your gabapentin  to 1 pill twice daily if needed.  You will either be contacted via phone regarding your referral to ENT, or you may receive a letter on your MyChart portal from our referral team with instructions for scheduling an appointment. Please let us  know if you have not been contacted by anyone within two weeks.  It was a pleasure to see you today!

## 2023-06-20 DIAGNOSIS — G8929 Other chronic pain: Secondary | ICD-10-CM

## 2023-06-21 MED ORDER — GABAPENTIN 100 MG PO CAPS
100.0000 mg | ORAL_CAPSULE | Freq: Two times a day (BID) | ORAL | 1 refills | Status: AC
Start: 2023-06-21 — End: ?

## 2023-07-05 ENCOUNTER — Encounter: Payer: Self-pay | Admitting: *Deleted

## 2023-07-08 ENCOUNTER — Encounter: Payer: Self-pay | Admitting: Primary Care

## 2023-07-08 ENCOUNTER — Ambulatory Visit (INDEPENDENT_AMBULATORY_CARE_PROVIDER_SITE_OTHER): Payer: Medicare Other | Admitting: Primary Care

## 2023-07-08 VITALS — BP 124/80 | HR 97 | Temp 97.8°F | Ht 71.0 in | Wt 138.0 lb

## 2023-07-08 DIAGNOSIS — Z125 Encounter for screening for malignant neoplasm of prostate: Secondary | ICD-10-CM

## 2023-07-08 DIAGNOSIS — Z7984 Long term (current) use of oral hypoglycemic drugs: Secondary | ICD-10-CM

## 2023-07-08 DIAGNOSIS — L729 Follicular cyst of the skin and subcutaneous tissue, unspecified: Secondary | ICD-10-CM | POA: Insufficient documentation

## 2023-07-08 DIAGNOSIS — I739 Peripheral vascular disease, unspecified: Secondary | ICD-10-CM | POA: Diagnosis not present

## 2023-07-08 DIAGNOSIS — R0989 Other specified symptoms and signs involving the circulatory and respiratory systems: Secondary | ICD-10-CM | POA: Insufficient documentation

## 2023-07-08 DIAGNOSIS — E1165 Type 2 diabetes mellitus with hyperglycemia: Secondary | ICD-10-CM | POA: Diagnosis not present

## 2023-07-08 LAB — LIPID PANEL
Cholesterol: 144 mg/dL (ref 0–200)
HDL: 53.7 mg/dL (ref >39.00)
LDL Cholesterol: 73 mg/dL (ref 0–99)
NonHDL: 90.21
Total CHOL/HDL Ratio: 3
Triglycerides: 88 mg/dL (ref 0.0–149.0)
VLDL: 17.6 mg/dL (ref 0.0–40.0)

## 2023-07-08 LAB — PSA, MEDICARE: PSA: 0.31 ng/mL (ref 0.10–4.00)

## 2023-07-08 LAB — POCT GLYCOSYLATED HEMOGLOBIN (HGB A1C): Hemoglobin A1C: 8.5 % — AB (ref 4.0–5.6)

## 2023-07-08 MED ORDER — LANCET DEVICE MISC: 1.0000 | Freq: Three times a day (TID) | 0 refills | Status: AC

## 2023-07-08 MED ORDER — BLOOD GLUCOSE TEST VI STRP: 1.0000 | ORAL_STRIP | Freq: Three times a day (TID) | 0 refills | Status: AC

## 2023-07-08 MED ORDER — LANCETS MISC. MISC: 1.0000 | Freq: Three times a day (TID) | 0 refills | Status: AC

## 2023-07-08 MED ORDER — BLOOD GLUCOSE MONITORING SUPPL DEVI: 1.0000 | Freq: Three times a day (TID) | 0 refills | Status: AC

## 2023-07-08 MED ORDER — METFORMIN HCL ER 500 MG PO TB24: 500.0000 mg | ORAL_TABLET | Freq: Every day | ORAL | 1 refills | Status: DC

## 2023-07-08 NOTE — Patient Instructions (Addendum)
 Stop by the lab prior to leaving today. I will notify you of your results once received.   Start checking your blood sugar levels.  Appropriate times to check your blood sugar levels are:  -Before any meal (breakfast, lunch, dinner) -Two hours after any meal (breakfast, lunch, dinner) -Bedtime  Record your readings and notify me if you continue to consistently run at or above 150.    New medication:  START metformin  500mg  once daily before breakfast for diabetes  Testing: ABI test to evaluate circulation/blood flow in legs. You will get a call to schedule this   Referrals:  Nutritionist - you will get a call to schedule this consult  Please schedule a follow up visit for 3 months.

## 2023-07-08 NOTE — Progress Notes (Signed)
 Subjective:    Patient ID: Shaun Roberts, male    DOB: 15-Feb-1966, 58 y.o.   MRN: [id number]  HPI  [name] is a very pleasant 58 y.o. male with a history of COPD, hepatic cirrhosis, type 2 diabetes, alcohol dependence, thrombocytopenia, tobacco use, MDD, DVT, GI bleed, constipation, lower extremity pain who presents today to discuss several concerns.   1) Type 2 Diabetes:  Current medications include: None  He is checking his blood glucose 0 times daily. He has an old glucometer  Last A1C:6.0 in August 2024, 8.5 today.  Last Eye Exam: UTD Last Foot Exam: Due Pneumonia Vaccination: 2019 Urine Microalbumin: Due Statin: None.   Dietary changes since last visit: Protein shakes 2-3 per day for which he started about 1 month ago. He avoids rice, pasta, potatoes. He mostly eats frozen TV dinners, canned soups, has recently started eating salads.    Exercise: None.  2) Varicose Veins: History of alcoholic cirrhosis, thrombocytopenia, DVT.   Chronic bulging of veins in the lower extremities for the past 1 year. He denies pain to the varicose veins. Sometimes has lower extremity pain with walking, sometimes his legs feel better when walking. History of right lower extremity surgeries. He denies lower extremity erythema and swelling. Sometimes his feet will feel cold. He has numbness to the plantar and dorsal feet, intermittently.  3) Skin Mass: Chronic for years, located proximal to left scapular region. He denies pain, swelling, increased size, erythema.   Wt Readings from Last 3 Encounters:  07/08/23 138 lb (62.6 kg)  06/15/23 139 lb (63 kg)  04/07/23 143 lb (64.9 kg)   BP Readings from Last 3 Encounters:  07/08/23 124/80  06/15/23 (!) 148/82  04/07/23 110/60     Review of Systems  Respiratory:  Negative for shortness of breath.   Cardiovascular:  Negative for chest pain and leg swelling.       Varicose veins  Gastrointestinal:  Negative for constipation and diarrhea.   Skin:  Negative for color change.       Skin mass to left posterior shoulder  Neurological:  Positive for numbness.         Past Medical History:  Diagnosis Date   Acute blood loss anemia 09/04/2022   Acute deep vein thrombosis (DVT) of right popliteal vein (HCC) 08/11/2021   Alcohol abuse    Alcohol withdrawal (HCC) 04/13/2018   Arthritis    Colon polyps    COPD (chronic obstructive pulmonary disease) (HCC)    Depression    Diabetes mellitus without complication (HCC)    Hyperlipidemia    Hypertension     Social History   Socioeconomic History   Marital status: Divorced    Spouse name: Not on file   Number of children: Not on file   Years of education: Not on file   Highest education level: Not on file  Occupational History   Not on file  Tobacco Use   Smoking status: Every Day    Current packs/day: 1.50    Types: Cigarettes   Smokeless tobacco: Never  Substance and Sexual Activity   Alcohol use: Not Currently    Comment: Hx of alcoholism   Drug use: Not on file   Sexual activity: Not Currently  Other Topics Concern   Not on file  Social History Narrative   Not on file   Social Drivers of Health   Financial Resource Strain: Not on file  Food Insecurity: No Food Insecurity (10/19/2022)   Hunger Vital  Sign    Worried About Programme Researcher, Broadcasting/film/video in the Last Year: Never true    Ran Out of Food in the Last Year: Never true  Transportation Needs: No Transportation Needs (10/19/2022)   PRAPARE - Administrator, Civil Service (Medical): No    Lack of Transportation (Non-Medical): No  Physical Activity: Not on file  Stress: Not on file  Social Connections: Not on file  Intimate Partner Violence: Not At Risk (10/19/2022)   Humiliation, Afraid, Rape, and Kick questionnaire    Fear of Current or Ex-Partner: No    Emotionally Abused: No    Physically Abused: No    Sexually Abused: No    Past Surgical History:  Procedure Laterality Date   ANTERIOR  CRUCIATE LIGAMENT REPAIR Right 2016   COLONOSCOPY N/A 09/08/2022   Procedure: COLONOSCOPY;  Surgeon: Toledo, Ladell POUR, MD;  Location: [facility];  Service: Gastroenterology;  Laterality: N/A;   COLONOSCOPY WITH PROPOFOL  N/A 05/18/2018   Procedure: COLONOSCOPY WITH BIOPSIES;  Surgeon: Jinny Carmine, MD;  Location: [facility];  Service: Endoscopy;  Laterality: N/A;   ESOPHAGOGASTRODUODENOSCOPY N/A 09/08/2022   Procedure: ESOPHAGOGASTRODUODENOSCOPY (EGD);  Surgeon: Toledo, Ladell POUR, MD;  Location: [facility];  Service: Gastroenterology;  Laterality: N/A;   KNEE RECONSTRUCTION     MEDIAL COLLATERAL LIGAMENT AND LATERAL COLLATERAL LIGAMENT REPAIR, KNEE Right 2016   POLYPECTOMY N/A 05/18/2018   Procedure: POLYPECTOMY;  Surgeon: Jinny Carmine, MD;  Location: [facility];  Service: Endoscopy;  Laterality: N/A;    Family History  Problem Relation Age of Onset   Depression Mother    Diabetes Mother    Early death Mother    Kidney disease Mother    HIV/AIDS Mother    Alcohol abuse Father    Diabetes Father    Early death Father    Leukemia Father    Depression Sister    Diabetes Sister    Drug abuse Sister    Early death Sister    Cancer Sister    Early death Paternal Grandfather    Heart attack Paternal Grandfather    Heart disease Paternal Grandfather     No Known Allergies  Current Outpatient Medications on File Prior to Visit  Medication Sig Dispense Refill   acetaminophen  (TYLENOL ) 650 MG CR tablet Take 650 mg by mouth every 8 (eight) hours as needed for pain.     folic acid  (FOLVITE ) 1 MG tablet Take 1 tablet (1 mg total) by mouth daily. 90 tablet 0   gabapentin  (NEURONTIN ) 100 MG capsule Take 1 capsule (100 mg total) by mouth 2 (two) times daily. For leg pain 180 capsule 1   Multiple Vitamin (MULTIVITAMIN WITH MINERALS) TABS tablet Take 1 tablet by mouth daily.     pantoprazole  (PROTONIX ) 40 MG tablet Take 1 tablet (40 mg total) by mouth 2 (two) times  daily. For ulcer prevention 180 tablet 0   spironolactone  (ALDACTONE ) 50 MG tablet Take 1 tablet (50 mg total) by mouth daily. for blood pressure. 90 tablet 0   thiamine  (VITAMIN B-1) 50 MG tablet Take 50 mg by mouth daily.     traZODone  (DESYREL ) 50 MG tablet Take 1 tablet (50 mg total) by mouth at bedtime. For sleep 90 tablet 1   albuterol  (VENTOLIN  HFA) 108 (90 Base) MCG/ACT inhaler Inhale 2 puffs into the lungs every 4 (four) hours as needed for shortness of breath. (Patient not taking: Reported on 07/08/2023) 1 each 0   No current facility-administered medications  on file prior to visit.    BP 124/80   Pulse 97   Temp 97.8 F (36.6 C) (Temporal)   Ht 5' 11 (1.803 m)   Wt 138 lb (62.6 kg)   SpO2 97%   BMI 19.25 kg/m  Objective:   Physical Exam Cardiovascular:     Rate and Rhythm: Normal rate and regular rhythm.     Pulses:          Dorsalis pedis pulses are 1+ on the right side and 2+ on the left side.       Posterior tibial pulses are 1+ on the right side and 2+ on the left side.     Comments: Mildly engorged vein to left lower extremity, lateral shin.  Pulmonary:     Effort: Pulmonary effort is normal.     Breath sounds: Normal breath sounds.  Musculoskeletal:     Cervical back: Neck supple.  Skin:    General: Skin is warm and dry.     Comments: 3.5 cm diameter superficial skin mass proximal to left scapular region.  Nontender, no erythema, no open wounds.  Neurological:     Mental Status: He is alert and oriented to person, place, and time.  Psychiatric:        Mood and Affect: Mood normal.           Assessment & Plan:  Type 2 diabetes mellitus with hyperglycemia, without long-term current use of insulin  (HCC) Assessment & Plan: Deteriorated with A1c at 8.5 today.  Suspect the addition has protein shakes have contributed.  We discussed to limit protein shakes. Referral placed for diabetes nutrition.  Start metformin  ER 500 mg daily. Foot exam  today. Prescription for glucometer provided today.  Instructions provided regarding testing.  Follow-up in 3 months.  Orders: -     Microalbumin / creatinine urine ratio -     Lipid panel -     Blood Glucose Monitoring Suppl; 1 each by Does not apply route in the morning, at noon, and at bedtime. May substitute to any manufacturer covered by patient's insurance.  Dispense: 1 each; Refill: 0 -     Blood Glucose Test; 1 each by In Vitro route in the morning, at noon, and at bedtime. May substitute to any manufacturer covered by patient's insurance.  Dispense: 300 strip; Refill: 0 -     Lancet Device; 1 each by Does not apply route in the morning, at noon, and at bedtime. May substitute to any manufacturer covered by patient's insurance.  Dispense: 1 each; Refill: 0 -     Lancets Misc.; 1 each by Does not apply route in the morning, at noon, and at bedtime. May substitute to any manufacturer covered by patient's insurance.  Dispense: 300 each; Refill: 0 -     Referral to Nutrition and Diabetes Services -     metFORMIN  HCl ER; Take 1 tablet (500 mg total) by mouth daily with breakfast. for diabetes.  Dispense: 90 tablet; Refill: 1  Controlled type 2 diabetes mellitus with hyperglycemia, without long-term current use of insulin  (HCC) Assessment & Plan: Deteriorated with A1c at 8.5 today.  Suspect the addition has protein shakes have contributed.  We discussed to limit protein shakes. Referral placed for diabetes nutrition.  Start metformin  ER 500 mg daily. Foot exam today. Prescription for glucometer provided today.  Instructions provided regarding testing.  Follow-up in 3 months.  Orders: -     POCT glycosylated hemoglobin (Hb A1C)  Intermittent claudication (HCC)  Assessment & Plan: With decreased pedal pulses.  With history of smoking, coupled with diabetes, will obtain ABIs bilaterally. He agrees.  Orders placed.  Orders: -     US  ARTERIAL SEG MULTIPLE LE (ABI, SEGMENTAL  PRESSURES, PVR'S); Future  Decreased pedal pulses Assessment & Plan: ABIs ordered pending.  Orders: -     US  ARTERIAL SEG MULTIPLE LE (ABI, SEGMENTAL PRESSURES, PVR'S); Future  Screening for prostate cancer -     PSA, Medicare  Benign skin cyst Assessment & Plan: Exam today representative of a benign cyst.  He will continue to monitor for now as the cyst is not bothersome. If the cyst becomes bothersome we can always consider surgical removal         Comer MARLA Gaskins, NP

## 2023-07-08 NOTE — Assessment & Plan Note (Signed)
 With decreased pedal pulses.  With history of smoking, coupled with diabetes, will obtain ABIs bilaterally. He agrees.  Orders placed.

## 2023-07-08 NOTE — Assessment & Plan Note (Signed)
 ABIs ordered pending.

## 2023-07-08 NOTE — Assessment & Plan Note (Addendum)
 Exam today representative of a benign cyst.  He will continue to monitor for now as the cyst is not bothersome. If the cyst becomes bothersome we can always consider surgical removal

## 2023-07-08 NOTE — Assessment & Plan Note (Addendum)
 Deteriorated with A1c at 8.5 today.  Suspect the addition has protein shakes have contributed.  We discussed to limit protein shakes. Referral placed for diabetes nutrition.  Start metformin  ER 500 mg daily. Foot exam today. Prescription for glucometer provided today.  Instructions provided regarding testing.  Follow-up in 3 months.

## 2023-07-11 LAB — MICROALBUMIN / CREATININE URINE RATIO
Creatinine,U: 110.4 mg/dL
Microalb Creat Ratio: 44.5 mg/g — ABNORMAL HIGH (ref 0.0–30.0)
Microalb, Ur: 4.9 mg/dL — ABNORMAL HIGH (ref 0.0–1.9)

## 2023-07-14 ENCOUNTER — Ambulatory Visit
Admission: RE | Admit: 2023-07-14 | Discharge: 2023-07-14 | Disposition: A | Discharge status: Home / Self Care | Payer: Medicare Other | Source: Ambulatory Visit | Attending: Primary Care | Admitting: Primary Care

## 2023-07-14 DIAGNOSIS — R0989 Other specified symptoms and signs involving the circulatory and respiratory systems: Secondary | ICD-10-CM | POA: Insufficient documentation

## 2023-07-14 DIAGNOSIS — I739 Peripheral vascular disease, unspecified: Secondary | ICD-10-CM | POA: Diagnosis present

## 2023-07-28 ENCOUNTER — Other Ambulatory Visit: Payer: Self-pay | Admitting: Primary Care

## 2023-07-28 DIAGNOSIS — K7031 Alcoholic cirrhosis of liver with ascites: Secondary | ICD-10-CM

## 2023-07-28 DIAGNOSIS — F1021 Alcohol dependence, in remission: Secondary | ICD-10-CM

## 2023-07-28 DIAGNOSIS — Z8719 Personal history of other diseases of the digestive system: Secondary | ICD-10-CM

## 2023-07-28 MED ORDER — SPIRONOLACTONE 50 MG PO TABS: 50.0000 mg | ORAL_TABLET | Freq: Every day | ORAL | 1 refills | Status: DC

## 2023-07-28 MED ORDER — FOLIC ACID 1 MG PO TABS: 1.0000 mg | ORAL_TABLET | Freq: Every day | ORAL | 1 refills | Status: DC

## 2023-08-15 ENCOUNTER — Encounter: Payer: Medicare Other | Attending: Primary Care | Admitting: Dietician

## 2023-08-15 DIAGNOSIS — E1165 Type 2 diabetes mellitus with hyperglycemia: Secondary | ICD-10-CM | POA: Diagnosis present

## 2023-08-15 NOTE — Progress Notes (Signed)
 Diabetes Self-Management Education  Visit Type: First/Initial  Appt. Start Time: 0800 Appt. End Time: 0913  08/15/2023  [name], identified by name and date of birth, is a 58 y.o. male with a diagnosis of Diabetes: Type 2.   ASSESSMENT  Patient is here today alone. Pt reports he lives alone. Pt reports he does all the shopping and cooking, Patient would like to learn how to increase body weight and manage diabetes. Pt reports a recent minor stoke, rehab to recondition and had to learn how to walk again. Pt reports undesired undesired weight reductions and a desire to increase body weight. Pt c/o Poor dentition and states he plans to schedule an appointment. Pt reports his Appetite is great with typical intake 3 meals with varying snack intake. Pt reports occasional Constipation, yet improving stool softner administration daily. All Pt's questions were answered during this encounter.   History includes:   Past Medical History:  Diagnosis Date   Acute blood loss anemia 09/04/2022   Acute deep vein thrombosis (DVT) of right popliteal vein (HCC) 08/11/2021   Alcohol abuse    Alcohol withdrawal (HCC) 04/13/2018   Arthritis    Colon polyps    COPD (chronic obstructive pulmonary disease) (HCC)    Depression    Diabetes mellitus without complication (HCC)    Hyperlipidemia    Hypertension     Labs noted:   Lab Results  Component Value Date   HGBA1C 8.5 (A) 07/08/2023    Lab Results  Component Value Date   CHOL 144 07/08/2023   HDL 53.70 07/08/2023   LDLCALC 73 07/08/2023   TRIG 88.0 07/08/2023   CHOLHDL 3 07/08/2023    Medications include:   Current Outpatient Medications:    acetaminophen (TYLENOL) 650 MG CR tablet, Take 650 mg by mouth every 8 (eight) hours as needed for pain., Disp: , Rfl:    albuterol (VENTOLIN HFA) 108 (90 Base) MCG/ACT inhaler, Inhale 2 puffs into the lungs every 4 (four) hours as needed for shortness of breath., Disp: 1 each, Rfl: 0    Blood Glucose Monitoring Suppl DEVI, 1 each by Does not apply route in the morning, at noon, and at bedtime. May substitute to any manufacturer covered by patient's insurance., Disp: 1 each, Rfl: 0   folic acid (FOLVITE) 1 MG tablet, Take 1 tablet (1 mg total) by mouth daily., Disp: 90 tablet, Rfl: 1   gabapentin (NEURONTIN) 100 MG capsule, Take 1 capsule (100 mg total) by mouth 2 (two) times daily. For leg pain, Disp: 180 capsule, Rfl: 1   Glucose Blood (BLOOD GLUCOSE TEST STRIPS) STRP, 1 each by In Vitro route in the morning, at noon, and at bedtime. May substitute to any manufacturer covered by patient's insurance., Disp: 300 strip, Rfl: 0   metFORMIN (GLUCOPHAGE-XR) 500 MG 24 hr tablet, Take 1 tablet (500 mg total) by mouth daily with breakfast. for diabetes., Disp: 90 tablet, Rfl: 1   Multiple Vitamin (MULTIVITAMIN WITH MINERALS) TABS tablet, Take 1 tablet by mouth daily., Disp: , Rfl:    pantoprazole (PROTONIX) 40 MG tablet, TAKE ONE TABLET BY MOUTH TWICE DAILY FORULCER PREVENTION, Disp: 180 tablet, Rfl: 1   spironolactone (ALDACTONE) 50 MG tablet, Take 1 tablet (50 mg total) by mouth daily. for blood pressure., Disp: 90 tablet, Rfl: 1   thiamine (VITAMIN B-1) 50 MG tablet, Take 50 mg by mouth daily., Disp: , Rfl:    traZODone (DESYREL) 50 MG tablet, Take 1 tablet (50 mg  total) by mouth at bedtime. For sleep, Disp: 90 tablet, Rfl: 1   Wt Readings from Last 3 Encounters:  07/08/23 138 lb (62.6 kg)  06/15/23 139 lb (63 kg)  04/07/23 143 lb (64.9 kg)    There were no vitals taken for this visit. There is no height or weight on file to calculate BMI.   Diabetes Self-Management Education - 08/15/23 0805       Visit Information   Visit Type First/Initial      Initial Visit   Diabetes Type Type 2    Date Diagnosed 30 years a go    Are you currently following a meal plan? No    Are you taking your medications as prescribed? Yes      Health Coping   How would you rate your overall  health? Fair      Psychosocial Assessment   Patient Belief/Attitude about Diabetes Motivated to manage diabetes    What is the hardest part about your diabetes right now, causing you the most concern, or is the most worrisome to you about your diabetes?   Making healty food and beverage choices;Getting support / problem solving    Self-care barriers Low literacy    Self-management support Doctor's office    Other persons present Patient    Patient Concerns Nutrition/Meal planning;Weight Control    Special Needs None    Preferred Learning Style No preference indicated    Learning Readiness Ready    How often do you need to have someone help you when you read instructions, pamphlets, or other written materials from your doctor or pharmacy? 2 - Rarely    What is the last grade level you completed in school? GED      Pre-Education Assessment   Patient understands the diabetes disease and treatment process. Needs Review    Patient understands incorporating nutritional management into lifestyle. Needs Instruction    Patient undertands incorporating physical activity into lifestyle. Needs Review    Patient understands using medications safely. Comprehends key points    Patient understands monitoring blood glucose, interpreting and using results Needs Review    Patient understands prevention, detection, and treatment of acute complications. Needs Review    Patient understands prevention, detection, and treatment of chronic complications. Needs Review    Patient understands how to develop strategies to address psychosocial issues. Needs Instruction    Patient understands how to develop strategies to promote health/change behavior. Needs Instruction      Complications   Last HgB A1C per patient/outside source 8.5 %    How often do you check your blood sugar? 1-2 times/day    Fasting Blood glucose range (mg/dL) 161-096    Postprandial Blood glucose range (mg/dL) >045    Number of hypoglycemic  episodes per month 0    Number of hyperglycemic episodes ( >200mg /dL): Daily    Can you tell when your blood sugar is high? No    Have you had a dilated eye exam in the past 12 months? No    Have you had a dental exam in the past 12 months? No    Are you checking your feet? Yes    How many days per week are you checking your feet? 7      Dietary Intake   Breakfast skips 1 d/w or ~10 am: cheerios, whole, berries, omelet with green pepper, onions, mushrooms, water    Lunch ~ 2pm: bagel with ham, cheese, mayo, butter, 2 deviled eggs, water  Snack (afternoon) none or nuts or    Dinner shake and bake chicken leg and thigh, 2 devlied eggs, water    Snack (evening) none or fruits or crackers or nabs or skinny pop    Beverage(s) water, coffee plain      Activity / Exercise   Activity / Exercise Type ADL's;Light (walking / raking leaves)    How many days per week do you exercise? 1    How many minutes per day do you exercise? 30    Total minutes per week of exercise 30      Patient Education   Previous Diabetes Education Yes (please comment)    Disease Pathophysiology Definition of diabetes, type 1 and 2, and the diagnosis of diabetes;Explored patient's options for treatment of their diabetes    Healthy Eating Plate Method;Role of diet in the treatment of diabetes and the relationship between the three main macronutrients and blood glucose level;Food label reading, portion sizes and measuring food.;Reviewed blood glucose goals for pre and post meals and how to evaluate the patients' food intake on their blood glucose level.    Being Active Role of exercise on diabetes management, blood pressure control and cardiac health.    Medications Reviewed patients medication for diabetes, action, purpose, timing of dose and side effects.    Monitoring Identified appropriate SMBG and/or A1C goals.    Acute complications Taught prevention, symptoms, and  treatment of hypoglycemia - the 15 rule.    Chronic  complications Dental care;Retinopathy and reason for yearly dilated eye exams;Relationship between chronic complications and blood glucose control;Assessed and discussed foot care and prevention of foot problems    Diabetes Stress and Support Identified and addressed patients feelings and concerns about diabetes;Worked with patient to identify barriers to care and solutions    Lifestyle and Health Coping Lifestyle issues that need to be addressed for better diabetes care      Individualized Goals (developed by patient)   Nutrition Follow meal plan discussed    Physical Activity Exercise 3-5 times per week;15 minutes per day    Medications take my medication as prescribed    Monitoring  Test my blood glucose as discussed    Problem Solving Eating Pattern;Addressing barriers to behavior change    Reducing Risk do foot checks daily;stop smoking;examine blood glucose patterns    Health Coping Ask for help with psychological, social, or emotional issues      Post-Education Assessment   Patient understands the diabetes disease and treatment process. Comprehends key points    Patient understands incorporating nutritional management into lifestyle. Needs Review    Patient undertands incorporating physical activity into lifestyle. Comprehends key points    Patient understands using medications safely. Comphrehends key points    Patient understands monitoring blood glucose, interpreting and using results Comprehends key points    Patient understands prevention, detection, and treatment of acute complications. Comprehends key points    Patient understands prevention, detection, and treatment of chronic complications. Comprehends key points    Patient understands how to develop strategies to address psychosocial issues. Comprehends key points    Patient understands how to develop strategies to promote health/change behavior. Comprehends key points      Outcomes   Expected Outcomes Demonstrated interest  in learning. Expect positive outcomes    Future DMSE 3-4 months    Program Status Not Completed             Individualized Plan for Diabetes Self-Management Training:   Learning Objective:  Patient will have a greater understanding of diabetes self-management. Patient education plan is to attend individual and/or group sessions per assessed needs and concerns.   Plan:   Patient Instructions  Aim for balanced meals and snacks on a schedule Consider monitoring blood sugar 2 hour after meals to evaluate   Expected Outcomes:  Demonstrated interest in learning. Expect positive outcomes  Education material provided: ADA - How to Thrive: A Guide for Your Journey with Diabetes, My Plate, Snack sheet, Support group flyer, and Diabetes Resources  If problems or questions, patient to contact team via:  Phone  Future DSME appointment: 3-4 months

## 2023-08-15 NOTE — Patient Instructions (Signed)
 Aim for balanced meals and snacks on a schedule Consider monitoring blood sugar 2 hour after meals to evaluate

## 2023-08-29 LAB — HM DIABETES EYE EXAM

## 2023-10-05 ENCOUNTER — Ambulatory Visit: Payer: Medicare Other | Admitting: Primary Care

## 2023-10-05 ENCOUNTER — Other Ambulatory Visit: Payer: Self-pay | Admitting: Primary Care

## 2023-10-05 ENCOUNTER — Encounter: Payer: Self-pay | Admitting: Primary Care

## 2023-10-05 VITALS — BP 128/66 | HR 83 | Temp 97.0°F | Ht 71.0 in | Wt 134.0 lb

## 2023-10-05 DIAGNOSIS — Z122 Encounter for screening for malignant neoplasm of respiratory organs: Secondary | ICD-10-CM

## 2023-10-05 DIAGNOSIS — Z7984 Long term (current) use of oral hypoglycemic drugs: Secondary | ICD-10-CM

## 2023-10-05 DIAGNOSIS — E1165 Type 2 diabetes mellitus with hyperglycemia: Secondary | ICD-10-CM | POA: Diagnosis not present

## 2023-10-05 DIAGNOSIS — Z720 Tobacco use: Secondary | ICD-10-CM

## 2023-10-05 LAB — MICROALBUMIN / CREATININE URINE RATIO
Creatinine,U: 80.7 mg/dL
Microalb Creat Ratio: 22.9 mg/g (ref 0.0–30.0)
Microalb, Ur: 1.8 mg/dL (ref 0.0–1.9)

## 2023-10-05 LAB — POCT GLYCOSYLATED HEMOGLOBIN (HGB A1C): Hemoglobin A1C: 7.8 % — AB (ref 4.0–5.6)

## 2023-10-05 MED ORDER — VARENICLINE TARTRATE (STARTER) 0.5 MG X 11 & 1 MG X 42 PO TBPK: ORAL_TABLET | ORAL | 0 refills | Status: DC

## 2023-10-05 NOTE — Assessment & Plan Note (Signed)
 Referral placed for lung cancer screening program.  Prescription for Chantix  sent to pharmacy.  We discussed instructions for starting the prescription and potential side effects. He will notify once he has completed the starter pack, we will send the continuing pack to his pharmacy at that point.

## 2023-10-05 NOTE — Patient Instructions (Signed)
 Start the Chantix  prescription to help you stop smoking.  Continue metformin  for diabetes.  Continue to work on regular exercise and a healthier diet.  You will receive a phone call regarding the lung cancer screening program.  Please schedule a follow up visit for 3 months.  It was a pleasure to see you today!

## 2023-10-05 NOTE — Assessment & Plan Note (Signed)
 Improved with A1C of 7.8 today. Commended him on dietary changes and exercise, encouraged to continue.  Continue metformin  ER 500 mg daily as he is working on lifestyle changes. Repeat urine microalbumin pending.  Follow up in 3 months.

## 2023-10-05 NOTE — Progress Notes (Signed)
 Subjective:    Patient ID: Jerrold Whitmarsh, male    DOB: 04/09/1966, 58 y.o.   MRN: [id number]  HPI  Jobey Cong is a very pleasant 58 y.o. male with history of type 2 diabetes, COPD, hepatic cirrhosis, alcohol dependence, tobacco abuse, who presents today for follow-up of diabetes. He would also like to discuss tobacco use.  1) Type 2 Diabetes: Current medications include: Metformin  ER 500 mg daily  He is checking his blood glucose on occasion and is getting readings of:  AM fasting: high 200s  Last A1C: 8.5 in February 2025, 7.8 today Last Eye Exam: UTD Last Foot Exam: UTD Pneumonia Vaccination: 2019 Urine Microalbumin: Due to recheck Statin: None.  Dietary changes since last visit: Increased protein, less carbs, does eat cereal for breakfast some days. Water  and coffee.    Exercise: Walking, recently joined a gym  2) Tobacco Abuse: Chronic smoker since the age of 89. At his heaviest he smoked 2.5 PPD. He currently smokes 1 PPD. He has not completed a lung cancer screening CT. He is interested in starting Chantix  again. He never refilled the pack, but thinks it did help.   BP Readings from Last 3 Encounters:  10/05/23 128/66  07/08/23 124/80  06/15/23 (!) 148/82       Review of Systems  Eyes:  Negative for visual disturbance.  Respiratory:  Negative for shortness of breath.   Cardiovascular:  Negative for chest pain.  Neurological:  Negative for numbness.         Past Medical History:  Diagnosis Date   Acute blood loss anemia 09/04/2022   Acute deep vein thrombosis (DVT) of right popliteal vein (HCC) 08/11/2021   Alcohol abuse    Alcohol withdrawal (HCC) 04/13/2018   Arthritis    Colon polyps    COPD (chronic obstructive pulmonary disease) (HCC)    Depression    Diabetes mellitus without complication (HCC)    Hyperlipidemia    Hypertension     Social History   Socioeconomic History   Marital status: Divorced    Spouse name: Not on file   Number  of children: Not on file   Years of education: Not on file   Highest education level: Not on file  Occupational History   Not on file  Tobacco Use   Smoking status: Every Day    Current packs/day: 1.50    Types: Cigarettes   Smokeless tobacco: Never  Substance and Sexual Activity   Alcohol use: Not Currently    Comment: Hx of alcoholism   Drug use: Not on file   Sexual activity: Not Currently  Other Topics Concern   Not on file  Social History Narrative   Not on file   Social Drivers of Health   Financial Resource Strain: Patient Declined (10/02/2023)   Overall Financial Resource Strain (CARDIA)    Difficulty of Paying Living Expenses: Patient declined  Food Insecurity: Patient Declined (10/02/2023)   Hunger Vital Sign    Worried About Running Out of Food in the Last Year: Patient declined    Ran Out of Food in the Last Year: Patient declined  Transportation Needs: Patient Declined (10/02/2023)   PRAPARE - Administrator, Civil Service (Medical): Patient declined    Lack of Transportation (Non-Medical): Patient declined  Physical Activity: Insufficiently Active (10/02/2023)   Exercise Vital Sign    Days of Exercise per Week: 2 days    Minutes of Exercise per Session: 30 min  Stress: Patient Declined (  10/02/2023)   [employer] [employer] - Occupational Stress Questionnaire    Feeling of Stress : Patient declined  Social Connections: Unknown (10/02/2023)   Social Connection and Isolation Panel [NHANES]    Frequency of Communication with Friends and Family: Patient declined    Frequency of Social Gatherings with Friends and Family: Patient declined    Attends Religious Services: Patient declined    Active Member of Clubs or Organizations: Patient declined    Attends Banker Meetings: Not on file    Marital Status: Patient declined  Intimate Partner Violence: Not At Risk (10/19/2022)   Humiliation, Afraid, Rape, and Kick questionnaire    Fear  of Current or Ex-Partner: No    Emotionally Abused: No    Physically Abused: No    Sexually Abused: No    Past Surgical History:  Procedure Laterality Date   ANTERIOR CRUCIATE LIGAMENT REPAIR Right 2016   COLONOSCOPY N/A 09/08/2022   Procedure: COLONOSCOPY;  Surgeon: Toledo, Alphonsus Jeans, MD;  Location: [facility];  Service: Gastroenterology;  Laterality: N/A;   COLONOSCOPY WITH PROPOFOL  N/A 05/18/2018   Procedure: COLONOSCOPY WITH BIOPSIES;  Surgeon: Marnee Sink, MD;  Location: [facility];  Service: Endoscopy;  Laterality: N/A;   ESOPHAGOGASTRODUODENOSCOPY N/A 09/08/2022   Procedure: ESOPHAGOGASTRODUODENOSCOPY (EGD);  Surgeon: Toledo, Alphonsus Jeans, MD;  Location: [facility];  Service: Gastroenterology;  Laterality: N/A;   KNEE RECONSTRUCTION     MEDIAL COLLATERAL LIGAMENT AND LATERAL COLLATERAL LIGAMENT REPAIR, KNEE Right 2016   POLYPECTOMY N/A 05/18/2018   Procedure: POLYPECTOMY;  Surgeon: Marnee Sink, MD;  Location: [facility];  Service: Endoscopy;  Laterality: N/A;    Family History  Problem Relation Age of Onset   Depression Mother    Diabetes Mother    Early death Mother    Kidney disease Mother    HIV/AIDS Mother    Alcohol abuse Father    Diabetes Father    Early death Father    Leukemia Father    Depression Sister    Diabetes Sister    Drug abuse Sister    Early death Sister    Cancer Sister    Early death Paternal Grandfather    Heart attack Paternal Grandfather    Heart disease Paternal Grandfather     No Known Allergies  Current Outpatient Medications on File Prior to Visit  Medication Sig Dispense Refill   Blood Glucose Monitoring Suppl DEVI 1 each by Does not apply route in the morning, at noon, and at bedtime. May substitute to any manufacturer covered by patient's insurance. 1 each 0   folic acid  (FOLVITE ) 1 MG tablet Take 1 tablet (1 mg total) by mouth daily. 90 tablet 1   gabapentin  (NEURONTIN ) 100 MG capsule Take 1 capsule (100 mg  total) by mouth 2 (two) times daily. For leg pain 180 capsule 1   Glucose Blood (BLOOD GLUCOSE TEST STRIPS) STRP 1 each by In Vitro route in the morning, at noon, and at bedtime. May substitute to any manufacturer covered by patient's insurance. 300 strip 0   metFORMIN  (GLUCOPHAGE -XR) 500 MG 24 hr tablet Take 1 tablet (500 mg total) by mouth daily with breakfast. for diabetes. 90 tablet 1   Multiple Vitamin (MULTIVITAMIN WITH MINERALS) TABS tablet Take 1 tablet by mouth daily.     pantoprazole  (PROTONIX ) 40 MG tablet TAKE ONE TABLET BY MOUTH TWICE DAILY FORULCER PREVENTION 180 tablet 1   spironolactone  (ALDACTONE ) 50 MG tablet Take 1 tablet (50 mg total) by  mouth daily. for blood pressure. 90 tablet 1   traZODone  (DESYREL ) 50 MG tablet Take 1 tablet (50 mg total) by mouth at bedtime. For sleep 90 tablet 1   acetaminophen  (TYLENOL ) 650 MG CR tablet Take 650 mg by mouth every 8 (eight) hours as needed for pain. (Patient not taking: Reported on 10/05/2023)     albuterol  (VENTOLIN  HFA) 108 (90 Base) MCG/ACT inhaler Inhale 2 puffs into the lungs every 4 (four) hours as needed for shortness of breath. (Patient not taking: Reported on 10/05/2023) 1 each 0   thiamine  (VITAMIN B-1) 50 MG tablet Take 50 mg by mouth daily. (Patient not taking: Reported on 10/05/2023)     No current facility-administered medications on file prior to visit.    BP 128/66   Pulse 83   Temp (!) 97 F (36.1 C) (Temporal)   Ht 5\' 11"  (1.803 m)   Wt 134 lb (60.8 kg)   SpO2 99%   BMI 18.69 kg/m  Objective:   Physical Exam Cardiovascular:     Rate and Rhythm: Normal rate and regular rhythm.  Pulmonary:     Effort: Pulmonary effort is normal.     Breath sounds: Normal breath sounds.  Musculoskeletal:     Cervical back: Neck supple.  Skin:    General: Skin is warm and dry.  Neurological:     Mental Status: He is alert and oriented to person, place, and time.  Psychiatric:        Mood and Affect: Mood normal.            Assessment & Plan:  Type 2 diabetes mellitus with hyperglycemia, without long-term current use of insulin  (HCC) Assessment & Plan: Improved with A1C of 7.8 today. Commended him on dietary changes and exercise, encouraged to continue.  Continue metformin  ER 500 mg daily as he is working on lifestyle changes. Repeat urine microalbumin pending.  Follow up in 3 months.  Orders: -     POCT glycosylated hemoglobin (Hb A1C) -     Microalbumin / creatinine urine ratio  Tobacco abuse Assessment & Plan: Referral placed for lung cancer screening program.  Prescription for Chantix  sent to pharmacy.  We discussed instructions for starting the prescription and potential side effects. He will notify once he has completed the starter pack, we will send the continuing pack to his pharmacy at that point.  Orders: -     Ambulatory Referral for Lung Cancer Scre -     Varenicline  Tartrate (Starter); Take one 0.5 mg tablet by mouth once daily for 3 days, then increase to one 0.5 mg tablet twice daily for 4 days, then increase to one 1 mg tablet twice daily.  Dispense: 53 each; Refill: 0  Screening for lung cancer -     Ambulatory Referral for Lung Cancer Scre        Kerby Hockley K Sharday Michl, NP

## 2023-10-06 ENCOUNTER — Telehealth: Payer: Self-pay | Admitting: Acute Care

## 2023-10-06 ENCOUNTER — Other Ambulatory Visit: Payer: Self-pay

## 2023-10-06 DIAGNOSIS — Z122 Encounter for screening for malignant neoplasm of respiratory organs: Secondary | ICD-10-CM

## 2023-10-06 DIAGNOSIS — F17210 Nicotine dependence, cigarettes, uncomplicated: Secondary | ICD-10-CM

## 2023-10-06 DIAGNOSIS — Z87891 Personal history of nicotine dependence: Secondary | ICD-10-CM

## 2023-10-06 NOTE — Telephone Encounter (Signed)
 Lung Cancer Screening Narrative/Criteria Questionnaire (Cigarette Smokers Only- No Cigars/Pipes/vapes)   Shaun Roberts   SDMV:10/14/23 at 0830a / [facility]                                           1966/05/13              LDCT: 10/18/23 at 0830am / [facility]    58 y.o.   Phone: [phone]  Lung Screening Narrative (confirm age 51-77 yrs Medicare / 50-80 yrs Private pay insurance)   Insurance information:Medicare A/B   Referring Provider:[facility]   This screening involves an initial phone call with a team member from our program. It is called a shared decision making visit. The initial meeting is required by insurance and Medicare to make sure you understand the program. This appointment takes about 15-20 minutes to complete. The CT scan will completed at a separate date/time. This scan takes about 5-10 minutes to complete and you may eat and drink before and after the scan.  Criteria questions for Lung Cancer Screening:   Are you a current or former smoker? Current Age began smoking: 12y   If you are a former smoker, what year did you quit smoking? NA   To calculate your smoking history, I need an accurate estimate of how many packs of cigarettes you smoked per day and for how many years. (Not just the number of PPD you are now smoking)   Years smoking 46 x Packs per day 1-2p = Pack years 96   (at least 20 pack yrs)   (Make sure they understand that we need to know how much they have smoked in the past, not just the number of PPD they are smoking now)  Do you have a personal history of cancer?  No    Do you have a family history of cancer? Yes  (cancer type and and relative) father/leukemia  Are you coughing up blood?  No  Have you had unexplained weight loss of 15 lbs or more in the last 6 months? No  It looks like you meet all criteria.     Additional information: Lost wt from a stroke about a year ago - trying to regain but not losing now

## 2023-10-10 NOTE — Progress Notes (Signed)
 Diabetes Self-Management Education  Visit Type:  Follow-up  Appt. Start Time: 0800 Appt. End Time: 0831  10/17/2023  [name], identified by name and date of birth, is a 58 y.o. male with a diagnosis of Diabetes:  .     ASSESSMENT  Pt is here today for follow up. Pt states he is eating healthier and desires weight gain. Pt states frustrated with no weight gains most recently. Pt reports a desire to add protein shake to his dietary intake. Pt reports typical intake of 2-3 meals with 1-2 snacks daily. Pt reports his appetite is great and states missing a meal twice weekly related to "running out of time". Pt reports self monitoring blood sugar "not often" with a recent blood sugar "over 200" and before bed "260" mg/dL. Pt reports he has joined a gym aiming for 5-7 days weekly. Pt reports he plans for dentist appointment in the future. Pt reports he has successfully decreased smoking. Pt reports constipation improving and taking stool softner administration BID daily. Pt reports he continues to aim for balanced meals and snacks on a schedule-RD reviewed balanced plate and snack options during this encounter.  Pt encouraged to also consider monitoring blood sugar 2 hour after meals to evaluate dietary intake. All Pt's questions were answered during this encounter.   Weight 135 lb 12.8 oz (61.6 kg). Body mass index is 18.94 kg/m.    Diabetes Self-Management Education - 10/17/23 0810       Psychosocial Assessment   What is the hardest part about your diabetes right now, causing you the most concern, or is the most worrisome to you about your diabetes?   Checking blood sugar;Making healty food and beverage choices;Getting support / problem solving    Self-care barriers None    Self-management support Doctor's office    Patient Concerns Nutrition/Meal planning;Monitoring;Problem Solving;Weight Control    Special Needs None    Preferred Learning Style No preference indicated    Learning  Readiness Change in progress      Pre-Education Assessment   Patient understands the diabetes disease and treatment process. Needs Review    Patient understands incorporating nutritional management into lifestyle. Needs Review    Patient undertands incorporating physical activity into lifestyle. Needs Review    Patient understands using medications safely. Needs Review    Patient understands monitoring blood glucose, interpreting and using results Needs Review    Patient understands prevention, detection, and treatment of acute complications. Needs Review    Patient understands prevention, detection, and treatment of chronic complications. Needs Review    Patient understands how to develop strategies to address psychosocial issues. Needs Review    Patient understands how to develop strategies to promote health/change behavior. Needs Review      Complications   Last HgB A1C per patient/outside source 7.8 %    How often do you check your blood sugar? 0 times/day (not testing)    Fasting Blood glucose range (mg/dL) >161    Postprandial Blood glucose range (mg/dL) >096    Number of hypoglycemic episodes per month 0    Number of hyperglycemic episodes ( >200mg /dL): Daily    Have you had a dilated eye exam in the past 12 months? Yes    Have you had a dental exam in the past 12 months? No    Are you checking your feet? Yes    How many days per week are you checking your feet? 7      Dietary Intake  Breakfast cheerios with berries, whole milk or omelet    Lunch 2 slices of whole grain, [place], chese, mayo    Dinner fried seafood, cole slaw, 1 huspuppie, baked potato with butter sour cream, water     Beverage(s) water , coffe plain, unsweetened tea      Activity / Exercise   Activity / Exercise Type Light (walking / raking leaves)    How many days per week do you exercise? 20    How many minutes per day do you exercise? 5    Total minutes per week of exercise 100      Patient Education    Previous Diabetes Education Yes (please comment)    Being Active Role of exercise on diabetes management, blood pressure control and cardiac health.    Monitoring Identified appropriate SMBG and/or A1C goals.;Daily foot exams;Yearly dilated eye exam      Individualized Goals (developed by patient)   Nutrition Follow meal plan discussed    Physical Activity Exercise 3-5 times per week;15 minutes per day;30 minutes per day    Medications take my medication as prescribed    Monitoring  Test my blood glucose as discussed    Problem Solving Eating Pattern;Medication consistency    Reducing Risk stop smoking;do foot checks daily    Health Coping Ask for help with psychological, social, or emotional issues      Patient Self-Evaluation of Goals - Patient rates self as meeting previously set goals (% of time)   Nutrition 25 - 50% (sometimes)    Physical Activity >75% (most of the time)    Medications >75% (most of the time)    Monitoring < 25% (hardly ever/never)    Problem Solving and behavior change strategies  25 - 50% (sometimes)    Reducing Risk (treating acute and chronic complications) >75% (most of the time)    Health Coping 25 - 50% (sometimes)      Post-Education Assessment   Patient understands the diabetes disease and treatment process. Comprehends key points    Patient understands incorporating nutritional management into lifestyle. Needs Review    Patient undertands incorporating physical activity into lifestyle. Comprehends key points    Patient understands using medications safely. Needs Review    Patient understands monitoring blood glucose, interpreting and using results Needs Review    Patient understands prevention, detection, and treatment of acute complications. Needs Review    Patient understands prevention, detection, and treatment of chronic complications. Needs Review    Patient understands how to develop strategies to address psychosocial issues. Needs Review     Patient understands how to develop strategies to promote health/change behavior. Needs Review      Outcomes   Program Status Not Completed      Subsequent Visit   Since your last visit have you continued or begun to take your medications as prescribed? Yes    Since your last visit have you experienced any weight changes? No change    Since your last visit, are you checking your blood glucose at least once a day? No             Learning Objective:  Patient will have a greater understanding of diabetes self-management. Patient education plan is to attend individual and/or group sessions per assessed needs and concerns.   Plan:   Patient Instructions  1-Consider adding protein shake such as boost glucose control or glucena daily.  2-Consider monitoring blood sugar 2 hour after any meal to evaluate dietary intake to determine best  choices    Expected Outcomes:  Demonstrated interest in learning. Expect positive outcomes  Education material provided: My Plate and Snack sheet  If problems or questions, patient to contact team via:  Phone  Future DSME appointment: - 3-4 months

## 2023-10-14 ENCOUNTER — Encounter: Admitting: Acute Care

## 2023-10-14 ENCOUNTER — Ambulatory Visit (INDEPENDENT_AMBULATORY_CARE_PROVIDER_SITE_OTHER): Admitting: Physician Assistant

## 2023-10-14 ENCOUNTER — Encounter: Payer: Self-pay | Admitting: Physician Assistant

## 2023-10-14 DIAGNOSIS — F17210 Nicotine dependence, cigarettes, uncomplicated: Secondary | ICD-10-CM

## 2023-10-14 NOTE — Progress Notes (Signed)
 Virtual Visit via Telephone Note  I connected with Waldron Gull on 10/14/23 at  8:47 AM by telephone and verified that I am speaking with the correct person using two identifiers.  Location: Patient: home Provider: working virtually from home   I discussed the limitations, risks, security and privacy concerns of performing an evaluation and management service by telephone and the availability of in person appointments. I also discussed with the patient that there may be a patient responsible charge related to this service. The patient expressed understanding and agreed to proceed.     Shared Decision Making Visit Lung Cancer Screening Program [phone])   Eligibility: Age 27 Pack Years Smoking History Calculation 60 (# packs/per year x # years smoked) Recent History of coughing up blood  No Unexplained weight loss? No ( >Than 15 pounds within the last 6 months ) Prior History Lung / other cancer No (Diagnosis within the last 5 years already requiring surveillance chest CT Scans). Smoking Status Current Smoker  Visit Components: Discussion included one or more decision making aids. Yes Discussion included risk/benefits of screening. Yes Discussion included potential follow up diagnostic testing for abnormal scans. Yes Discussion included meaning and risk of over diagnosis. Yes Discussion included meaning and risk of False Positives. Yes Discussion included meaning of total radiation exposure. Yes  Counseling Included: Importance of adherence to annual lung cancer LDCT screening. Yes Impact of comorbidities on ability to participate in the program. Yes Ability and willingness to under diagnostic treatment: Yes  Smoking Cessation Counseling: Current Smokers:  Discussed importance of smoking cessation. Yes Information about tobacco cessation classes and interventions provided to patient. Yes Symptomatic Patient. No Diagnosis Code: Tobacco Use Z72.0 Asymptomatic Patient  Yes  Counseling (Intermediate counseling: > three minutes counseling) A2130 Information about tobacco cessation classes and interventions provided to patient. Yes Written Order for Lung Cancer Screening with LDCT placed in Epic. Yes (CT Chest Lung Cancer Screening Low Dose W/O CM) [id number] Z12.2-Screening of respiratory organs Z87.891-Personal history of nicotine  dependence   I have spent 25 minutes of face to face/ virtual visit  time with the patient discussing the risks and benefits of lung cancer screening. We discussed the above noted topics. We paused at intervals to allow for questions to be asked and answered to ensure understanding.We discussed that the single most powerful action that anyone can take to decrease their risk of developing lung cancer is to quit smoking.  We discussed options for tools to aid in quitting smoking including nicotine  replacement therapy, non-nicotine  medications, support groups, Quit Smart classes, and behavior modification. We discussed that often times setting smaller, more achievable goals, such as eliminating 1 cigarette a day for a week and then 2 cigarettes a day for a week can be helpful in slowly decreasing the number of cigarettes smoked. I provided  them  with smoking cessation  information  with contact information for community resources, classes, free nicotine  replacement therapy, and access to mobile apps, text messaging, and on-line smoking cessation help. I have also provided  them  the office contact information in the event they have any questions. We discussed the time and location of the scan, and that either Pearl Bottcher RN, Deann Exon, RN  or I will call / send a letter with the results within 24-72 hours of receiving them. The patient verbalized understanding of all of  the above and had no further questions upon leaving the office. They have my contact information in the event they have any further  questions.  I spent 3 minutes counseling  on smoking cessation and the health risks of continued tobacco abuse.  I explained to the patient that there has been a high incidence of coronary artery disease noted on these exams. I explained that this is a non-gated exam therefore degree or severity cannot be determined. This patient is not on statin therapy. I have asked the patient to follow-up with their PCP regarding any incidental finding of coronary artery disease and management with diet or medication as their PCP  feels is clinically indicated. The patient verbalized understanding of the above and had no further questions upon completion of the visit.    Patt Boozer Najee Manninen, PA-C

## 2023-10-14 NOTE — Patient Instructions (Signed)

## 2023-10-17 ENCOUNTER — Encounter: Attending: Primary Care | Admitting: Dietician

## 2023-10-17 VITALS — Wt 135.8 lb

## 2023-10-17 DIAGNOSIS — E1165 Type 2 diabetes mellitus with hyperglycemia: Secondary | ICD-10-CM | POA: Diagnosis present

## 2023-10-17 NOTE — Patient Instructions (Signed)
 1-Consider adding protein shake such as boost glucose control or glucena daily.  2-Consider monitoring blood sugar 2 hour after any meal to evaluate dietary intake to determine best choices

## 2023-10-18 ENCOUNTER — Ambulatory Visit
Admission: RE | Admit: 2023-10-18 | Discharge: 2023-10-18 | Disposition: A | Discharge status: Home / Self Care | Source: Ambulatory Visit | Attending: Acute Care | Admitting: Acute Care

## 2023-10-18 DIAGNOSIS — Z122 Encounter for screening for malignant neoplasm of respiratory organs: Secondary | ICD-10-CM | POA: Insufficient documentation

## 2023-10-18 DIAGNOSIS — Z87891 Personal history of nicotine dependence: Secondary | ICD-10-CM | POA: Insufficient documentation

## 2023-10-18 DIAGNOSIS — F17210 Nicotine dependence, cigarettes, uncomplicated: Secondary | ICD-10-CM | POA: Diagnosis present

## 2023-10-26 ENCOUNTER — Other Ambulatory Visit: Payer: Self-pay | Admitting: Primary Care

## 2023-10-26 DIAGNOSIS — K7031 Alcoholic cirrhosis of liver with ascites: Secondary | ICD-10-CM

## 2023-10-27 ENCOUNTER — Other Ambulatory Visit: Payer: Self-pay | Admitting: Primary Care

## 2023-10-27 DIAGNOSIS — Z720 Tobacco use: Secondary | ICD-10-CM

## 2023-10-27 MED ORDER — VARENICLINE TARTRATE 1 MG PO TABS: 1.0000 mg | ORAL_TABLET | Freq: Two times a day (BID) | ORAL | 0 refills | Status: DC

## 2023-11-03 ENCOUNTER — Encounter: Payer: Self-pay | Admitting: Internal Medicine

## 2023-11-04 ENCOUNTER — Encounter: Payer: Self-pay | Admitting: Gastroenterology

## 2023-11-04 ENCOUNTER — Other Ambulatory Visit: Payer: Self-pay | Admitting: Gastroenterology

## 2023-11-04 DIAGNOSIS — K7030 Alcoholic cirrhosis of liver without ascites: Secondary | ICD-10-CM

## 2023-11-04 DIAGNOSIS — K3189 Other diseases of stomach and duodenum: Secondary | ICD-10-CM

## 2023-11-08 ENCOUNTER — Other Ambulatory Visit: Payer: Self-pay

## 2023-11-08 DIAGNOSIS — Z122 Encounter for screening for malignant neoplasm of respiratory organs: Secondary | ICD-10-CM

## 2023-11-08 DIAGNOSIS — F17210 Nicotine dependence, cigarettes, uncomplicated: Secondary | ICD-10-CM

## 2023-11-08 DIAGNOSIS — Z87891 Personal history of nicotine dependence: Secondary | ICD-10-CM

## 2023-11-15 ENCOUNTER — Ambulatory Visit
Admission: RE | Admit: 2023-11-15 | Discharge: 2023-11-15 | Disposition: A | Discharge status: Home / Self Care | Source: Ambulatory Visit | Attending: Gastroenterology | Admitting: Gastroenterology

## 2023-11-15 DIAGNOSIS — K766 Portal hypertension: Secondary | ICD-10-CM | POA: Diagnosis present

## 2023-11-15 DIAGNOSIS — K7030 Alcoholic cirrhosis of liver without ascites: Secondary | ICD-10-CM | POA: Diagnosis present

## 2023-11-15 DIAGNOSIS — K3189 Other diseases of stomach and duodenum: Secondary | ICD-10-CM | POA: Diagnosis present

## 2023-11-23 ENCOUNTER — Other Ambulatory Visit: Payer: Self-pay

## 2023-11-23 ENCOUNTER — Ambulatory Visit: Admitting: Anesthesiology

## 2023-11-23 ENCOUNTER — Ambulatory Visit
Admission: RE | Admit: 2023-11-23 | Discharge: 2023-11-23 | Disposition: A | Discharge status: Home / Self Care | Attending: Internal Medicine | Admitting: Internal Medicine

## 2023-11-23 ENCOUNTER — Encounter: Payer: Self-pay | Admitting: Internal Medicine

## 2023-11-23 ENCOUNTER — Encounter
Admission: RE | Disposition: A | Discharge status: Home / Self Care | Payer: Self-pay | Source: Home / Self Care | Attending: Internal Medicine

## 2023-11-23 DIAGNOSIS — Z79899 Other long term (current) drug therapy: Secondary | ICD-10-CM | POA: Diagnosis not present

## 2023-11-23 DIAGNOSIS — Z86718 Personal history of other venous thrombosis and embolism: Secondary | ICD-10-CM | POA: Insufficient documentation

## 2023-11-23 DIAGNOSIS — D696 Thrombocytopenia, unspecified: Secondary | ICD-10-CM | POA: Insufficient documentation

## 2023-11-23 DIAGNOSIS — D128 Benign neoplasm of rectum: Secondary | ICD-10-CM | POA: Diagnosis not present

## 2023-11-23 DIAGNOSIS — I10 Essential (primary) hypertension: Secondary | ICD-10-CM | POA: Insufficient documentation

## 2023-11-23 DIAGNOSIS — K269 Duodenal ulcer, unspecified as acute or chronic, without hemorrhage or perforation: Secondary | ICD-10-CM | POA: Insufficient documentation

## 2023-11-23 DIAGNOSIS — J449 Chronic obstructive pulmonary disease, unspecified: Secondary | ICD-10-CM | POA: Diagnosis not present

## 2023-11-23 DIAGNOSIS — Z87891 Personal history of nicotine dependence: Secondary | ICD-10-CM | POA: Insufficient documentation

## 2023-11-23 DIAGNOSIS — Z7951 Long term (current) use of inhaled steroids: Secondary | ICD-10-CM | POA: Diagnosis not present

## 2023-11-23 DIAGNOSIS — K7030 Alcoholic cirrhosis of liver without ascites: Secondary | ICD-10-CM | POA: Insufficient documentation

## 2023-11-23 DIAGNOSIS — Z7984 Long term (current) use of oral hypoglycemic drugs: Secondary | ICD-10-CM | POA: Diagnosis not present

## 2023-11-23 DIAGNOSIS — E119 Type 2 diabetes mellitus without complications: Secondary | ICD-10-CM | POA: Insufficient documentation

## 2023-11-23 DIAGNOSIS — K766 Portal hypertension: Secondary | ICD-10-CM | POA: Insufficient documentation

## 2023-11-23 DIAGNOSIS — Z1211 Encounter for screening for malignant neoplasm of colon: Secondary | ICD-10-CM | POA: Insufficient documentation

## 2023-11-23 DIAGNOSIS — M1990 Unspecified osteoarthritis, unspecified site: Secondary | ICD-10-CM | POA: Insufficient documentation

## 2023-11-23 DIAGNOSIS — K3189 Other diseases of stomach and duodenum: Secondary | ICD-10-CM | POA: Insufficient documentation

## 2023-11-23 HISTORY — PX: COLONOSCOPY: SHX5424

## 2023-11-23 HISTORY — PX: POLYPECTOMY: SHX149

## 2023-11-23 HISTORY — PX: ESOPHAGOGASTRODUODENOSCOPY: SHX5428

## 2023-11-23 LAB — GLUCOSE, CAPILLARY: Glucose-Capillary: 145 mg/dL — ABNORMAL HIGH (ref 70–99)

## 2023-11-23 SURGERY — COLONOSCOPY
Anesthesia: General

## 2023-11-23 MED ORDER — SODIUM CHLORIDE 0.9 % IV SOLN: INTRAVENOUS | Status: DC

## 2023-11-23 MED ORDER — GLYCOPYRROLATE 0.2 MG/ML IJ SOLN
INTRAMUSCULAR | Status: DC | PRN
  Administered 2023-11-23: .2 mg via INTRAVENOUS

## 2023-11-23 MED ORDER — GLYCOPYRROLATE 0.2 MG/ML IJ SOLN
INTRAMUSCULAR | Status: AC
  Filled 2023-11-23: qty 1

## 2023-11-23 MED ORDER — PROPOFOL 10 MG/ML IV BOLUS
INTRAVENOUS | Status: DC | PRN
  Administered 2023-11-23 (×2): 50 mg via INTRAVENOUS

## 2023-11-23 MED ORDER — DEXMEDETOMIDINE HCL IN NACL 80 MCG/20ML IV SOLN
INTRAVENOUS | Status: DC | PRN
  Administered 2023-11-23: 20 ug via INTRAVENOUS

## 2023-11-23 MED ORDER — PROPOFOL 500 MG/50ML IV EMUL
INTRAVENOUS | Status: DC | PRN
  Administered 2023-11-23: 75 ug/kg/min via INTRAVENOUS

## 2023-11-23 MED ORDER — LIDOCAINE HCL (PF) 2 % IJ SOLN
INTRAMUSCULAR | Status: AC
Start: 2023-11-23 — End: 2023-11-23
  Filled 2023-11-23: qty 5

## 2023-11-23 MED ORDER — LIDOCAINE HCL (CARDIAC) PF 100 MG/5ML IV SOSY
PREFILLED_SYRINGE | INTRAVENOUS | Status: DC | PRN
  Administered 2023-11-23: 60 mg via INTRAVENOUS

## 2023-11-23 MED ORDER — EPHEDRINE 5 MG/ML INJ
INTRAVENOUS | Status: AC
  Filled 2023-11-23: qty 5

## 2023-11-23 NOTE — Anesthesia Preprocedure Evaluation (Addendum)
 Anesthesia Evaluation  Patient identified by MRN, date of birth, ID band Patient awake    Reviewed: Allergy & Precautions, NPO status , Patient's Chart, lab work & pertinent test results  History of Anesthesia Complications Negative for: history of anesthetic complications  Airway Mallampati: I   Neck ROM: Full    Dental  (+) Missing   Pulmonary COPD, former smoker (quit 5 weeks ago)   Pulmonary exam normal breath sounds clear to auscultation       Cardiovascular hypertension, Normal cardiovascular exam Rhythm:Regular Rate:Normal  Hx DVT  Echo 08/03/21:    NORMAL LEFT VENTRICULAR SYSTOLIC FUNCTION    NORMAL RIGHT VENTRICULAR SYSTOLIC FUNCTION    VALVULAR REGURGITATION: TRIVIAL MR, TRIVIAL TR    NO VALVULAR STENOSIS    POOR SOUND TRANSMISSION    NO PRIOR STUDY FOR COMPARISON     Neuro/Psych Hx alcohol use disorder, none in past year    GI/Hepatic negative GI ROS,,,(+) Cirrhosis         Endo/Other  diabetes, Type 2    Renal/GU      Musculoskeletal  (+) Arthritis ,    Abdominal   Peds  Hematology  (+) Blood dyscrasia (thrombocytopenia), anemia   Anesthesia Other Findings   Reproductive/Obstetrics                             Anesthesia Physical Anesthesia Plan  ASA: 3  Anesthesia Plan: General   Post-op Pain Management:    Induction: Intravenous  PONV Risk Score and Plan: 2 and Propofol  infusion, TIVA and Treatment may vary due to age or medical condition  Airway Management Planned: Nasal ETT  Additional Equipment:   Intra-op Plan:   Post-operative Plan: Extubation in OR  Informed Consent: I have reviewed the patients History and Physical, chart, labs and discussed the procedure including the risks, benefits and alternatives for the proposed anesthesia with the patient or authorized representative who has indicated his/her understanding and acceptance.       Plan  Discussed with: CRNA  Anesthesia Plan Comments: (LMA/GETA backup discussed.  Patient consented for risks of anesthesia including but not limited to:  - adverse reactions to medications - damage to eyes, teeth, lips or other oral mucosa - nerve damage due to positioning  - sore throat or hoarseness - damage to heart, brain, nerves, lungs, other parts of body or loss of life  Informed patient about role of CRNA in peri- and intra-operative care.  Patient voiced understanding.)        Anesthesia Quick Evaluation

## 2023-11-23 NOTE — H&P (Signed)
 Outpatient short stay form Pre-procedure 11/23/2023 8:52 AM Shaun Roberts, M.D.  Primary Physician: Comer Gaskins, NP  Reason for visit:  Portal hypertension, Alcoholic cirrhosis, peptic ulcer disease  History of present illness:  Shaun Roberts presents to the [facility] at the request of Comer Gaskins, NP, to establish care for alcoholic cirrhosis and history of peptic ulcer disease. He was admitted at [facility] 5/21 - 10/22/2022 for chief complaint of abdominal distention found to have moderate-large volume of ascites on CT scan. He had LVP performed which removed 5L ascitic fluid with fluid analysis negative for SBP. GI was not consulted during this admission. He was admitted 4/5 - 09/09/2022 at [facility] for acute onset of generalized weakness and FTT. Hemoglobin was 6.0 and stool was guaiac positive. CT scan commented on cirrhosis with evidence of portal hypertension, including ascites and upper abdominal varices. He was seen as a consult in the hospital by Dr. Aundria and myself for blood loss anemia and is s/p EGD and colonoscopy. EGD showed moderate portal hypertensive gastropathy, no evidence of varices, one non-bleeding gastric ulcer, duodenitis, and one non-bleeding duodenal ulcer. Colonoscopy was aborted and not completed 2/2 poor prep. He received a total of 3 units pRBCs during this admission. Hemoglobin upon discharge was 8.1. At his hospital follow-up visit with PCP, labs showed resolution of his anemia. He has not had any outpatient GI evaluation. He reports the diagnosis of cirrhosis was new to him last year when he went in to the hospital. He has not had any dedicated liver imaging since hospitalization. He reports he feels well currently. He is on spironolactone  50 mg daily. He no longer takes furosemide . He denies any further issues with abdominal ascites. No lower extremity edema, sleep disturbances, jaundice, pruritus, hematochezia, or melena. He denies any known family history of  chronic liver disease. He reports no EtOH use in over a year. Today is 2 weeks without any cigarettes. He lives by himself. No other questions at this time.     No current facility-administered medications for this encounter.  Medications Prior to Admission  Medication Sig Dispense Refill Last Dose/Taking   acetaminophen  (TYLENOL ) 650 MG CR tablet Take 650 mg by mouth every 8 (eight) hours as needed for pain. (Patient not taking: Reported on 10/05/2023)      albuterol  (VENTOLIN  HFA) 108 (90 Base) MCG/ACT inhaler Inhale 2 puffs into the lungs every 4 (four) hours as needed for shortness of breath. (Patient not taking: Reported on 10/05/2023) 1 each 0    Blood Glucose Monitoring Suppl DEVI 1 each by Does not apply route in the morning, at noon, and at bedtime. May substitute to any manufacturer covered by patient's insurance. 1 each 0    folic acid  (FOLVITE ) 1 MG tablet Take 1 tablet (1 mg total) by mouth daily. 90 tablet 1    gabapentin  (NEURONTIN ) 100 MG capsule Take 1 capsule (100 mg total) by mouth 2 (two) times daily. For leg pain 180 capsule 1    Glucose Blood (BLOOD GLUCOSE TEST STRIPS) STRP 1 each by In Vitro route in the morning, at noon, and at bedtime. May substitute to any manufacturer covered by patient's insurance. 300 strip 0    metFORMIN  (GLUCOPHAGE -XR) 500 MG 24 hr tablet Take 1 tablet (500 mg total) by mouth daily with breakfast. for diabetes. 90 tablet 1    Multiple Vitamin (MULTIVITAMIN WITH MINERALS) TABS tablet Take 1 tablet by mouth daily.      pantoprazole  (PROTONIX ) 40 MG tablet  TAKE ONE TABLET BY MOUTH TWICE DAILY FORULCER PREVENTION 180 tablet 1    spironolactone  (ALDACTONE ) 50 MG tablet Take 1 tablet (50 mg total) by mouth daily. for blood pressure. 90 tablet 1    thiamine  (VITAMIN B-1) 50 MG tablet Take 50 mg by mouth daily. (Patient not taking: Reported on 10/05/2023)      traZODone  (DESYREL ) 50 MG tablet Take 1 tablet (50 mg total) by mouth at bedtime. For sleep 90 tablet 1     varenicline  (CHANTIX  CONTINUING MONTH PAK) 1 MG tablet Take 1 tablet (1 mg total) by mouth 2 (two) times daily. 60 tablet 0      No Known Allergies   Past Medical History:  Diagnosis Date   Acute blood loss anemia 09/04/2022   Acute deep vein thrombosis (DVT) of right popliteal vein (HCC) 08/11/2021   Alcohol abuse    Alcohol withdrawal (HCC) 04/13/2018   Arthritis    Colon polyps    COPD (chronic obstructive pulmonary disease) (HCC)    Depression    Diabetes mellitus without complication (HCC)    Hyperlipidemia    Hypertension     Review of systems:  Otherwise negative.    Physical Exam  Gen: Alert, oriented. Appears stated age.  HEENT: [place]/AT. PERRLA. Lungs: CTA, no wheezes. CV: RR nl S1, S2. Abd: soft, benign, no masses. BS+ Ext: No edema. Pulses 2+    Planned procedures: Proceed with EGD and colonoscopy. The patient understands the nature of the planned procedure, indications, risks, alternatives and potential complications including but not limited to bleeding, infection, perforation, damage to internal organs and possible oversedation/side effects from anesthesia. The patient agrees and gives consent to proceed.  Please refer to procedure notes for findings, recommendations and patient disposition/instructions.     Shaun Roberts, M.D. Gastroenterology 11/23/2023  8:52 AM

## 2023-11-23 NOTE — Interval H&P Note (Signed)
 History and Physical Interval Note:  11/23/2023 8:53 AM  Shaun Roberts  has presented today for surgery, with the diagnosis of K70.30 (ICD-10-CM) - Alcoholic cirrhosis of liver without ascites  F10.11 (ICD-10-CM) - History of alcohol abuse Z87.11 (ICD-10-CM) - History of gastric ulcer V12.79 (ICD-9-CM) - Z87.19 (ICD-10-CM) - History of duodenal ulcer K76.6, K31.89 (ICD-10-CM) - Portal hypertensive gastropathy   Z86.0101 (ICD-10-CM) - Hx of adenomatous polyp of colon.  The various methods of treatment have been discussed with the patient and family. After consideration of risks, benefits and other options for treatment, the patient has consented to  Procedure(s) with comments: COLONOSCOPY (N/A) - DM: 8:45 EGD (ESOPHAGOGASTRODUODENOSCOPY) (N/A) as a surgical intervention.  The patient's history has been reviewed, patient examined, no change in status, stable for surgery.  I have reviewed the patient's chart and labs.  Questions were answered to the patient's satisfaction.     [place], Kandon Hosking

## 2023-11-23 NOTE — Transfer of Care (Signed)
 Immediate Anesthesia Transfer of Care Note  Patient: Shaun Roberts  Procedure(s) Performed: COLONOSCOPY EGD (ESOPHAGOGASTRODUODENOSCOPY)  Patient Location: PACU  Anesthesia Type:General  Level of Consciousness: sedated  Airway & Oxygen Therapy: Patient Spontanous Breathing  Post-op Assessment: Report given to RN and Post -op Vital signs reviewed and stable  Post vital signs: Reviewed and stable  Last Vitals:  Vitals Value Taken Time  BP 103/62 11/23/23 09:47  Temp    Pulse 73 11/23/23 09:48  Resp 17 11/23/23 09:48  SpO2 100 % 11/23/23 09:48  Vitals shown include unfiled device data.  Last Pain:  Vitals:   11/23/23 0947  TempSrc:   PainSc: Asleep         Complications: No notable events documented.

## 2023-11-23 NOTE — Op Note (Signed)
 [facility] Gastroenterology Patient Name: Shaun Roberts Procedure Date: 11/23/2023 9:11 AM MRN: [id number] Account #: [id number] Date of Birth: 1965/11/24 Admit Type: Outpatient Age: 58 Room: [facility] ENDO ROOM 3 Gender: Male Note Status: Finalized Instrument Name: Peds Colonoscope [id number] Procedure:             Colonoscopy Indications:           High risk colon cancer surveillance: Personal history                         of multiple (3 or more) adenomas Providers:             Geoffrey Hynes K. Malcolm Hetz MD, MD Medicines:             Propofol  per Anesthesia Complications:         No immediate complications. Estimated blood loss: None. Procedure:             Pre-Anesthesia Assessment:                        - The risks and benefits of the procedure and the                         sedation options and risks were discussed with the                         patient. All questions were answered and informed                         consent was obtained.                        - Patient identification and proposed procedure were                         verified prior to the procedure by the nurse. The                         procedure was verified in the procedure room.                        - ASA Grade Assessment: III - A patient with severe                         systemic disease.                        - After reviewing the risks and benefits, the patient                         was deemed in satisfactory condition to undergo the                         procedure.                        After obtaining informed consent, the colonoscope was                         passed under direct vision. Throughout the procedure,  the patient's blood pressure, pulse, and oxygen                         saturations were monitored continuously. The                         Colonoscope was introduced through the anus and                         advanced to the the cecum,  identified by appendiceal                         orifice and ileocecal valve. The colonoscopy was                         performed without difficulty. The patient tolerated                         the procedure well. The quality of the bowel                         preparation was good. The ileocecal valve, appendiceal                         orifice, and rectum were photographed. Findings:      The perianal and digital rectal examinations were normal. Pertinent       negatives include normal sphincter tone and no palpable rectal lesions.      Two pedunculated polyps were found in the rectum. The polyps were 12 to       15 mm in size. These polyps were removed with a hot snare. Resection and       retrieval were complete. Estimated blood loss: none.      The exam was otherwise without abnormality on direct and retroflexion       views. Impression:            - Two 12 to 15 mm polyps in the rectum, removed with a                         hot snare. Resected and retrieved.                        - The examination was otherwise normal on direct and                         retroflexion views. Recommendation:        - Patient has a contact number available for                         emergencies. The signs and symptoms of potential                         delayed complications were discussed with the patient.                         Return to normal activities tomorrow. Written  discharge instructions were provided to the patient.                        - Continue present medications.                        - Repeat colonoscopy is recommended for surveillance.                         The colonoscopy date will be determined after                         pathology results from today's exam become available                         for review.                        - Return to GI office in 6 months.                        - The findings and recommendations were discussed  with                         the patient. Procedure Code(s):     --- Professional ---                        [phone], Colonoscopy, flexible; with removal of                         tumor(s), polyp(s), or other lesion(s) by snare                         technique Diagnosis Code(s):     --- Professional ---                        D12.8, Benign neoplasm of rectum                        Z86.010, Personal history of colonic polyps CPT copyright 2022 American [employer]. All rights reserved. The codes documented in this report are preliminary and upon coder review may  be revised to meet current compliance requirements. Ladell MARLA Boss MD, MD 11/23/2023 9:50:15 AM This report has been signed electronically. Number of Addenda: 0 Note Initiated On: 11/23/2023 9:11 AM Scope Withdrawal Time: 0 hours 7 minutes 7 seconds  Total Procedure Duration: 0 hours 11 minutes 10 seconds  Estimated Blood Loss:  Estimated blood loss: none.      [facility]

## 2023-11-23 NOTE — Op Note (Signed)
 [facility] Gastroenterology Patient Name: Shaun Roberts Procedure Date: 11/23/2023 9:11 AM MRN: [id number] Account #: [id number] Date of Birth: [birth year] Admit Type: Outpatient Age: 58 Room: [facility] ENDO ROOM 3 Gender: Male Note Status: Finalized Instrument Name: Upper Endoscope [id number] Procedure:             Upper GI endoscopy Indications:           Follow-up of chronic duodenal ulcer, Portal venous                         hypertension Providers:             Karlis Cregg K. Yeiren Whitecotton MD, MD Medicines:             Propofol  per Anesthesia Complications:         No immediate complications. Estimated blood loss: None. Procedure:             Pre-Anesthesia Assessment:                        - The risks and benefits of the procedure and the                         sedation options and risks were discussed with the                         patient. All questions were answered and informed                         consent was obtained.                        - Patient identification and proposed procedure were                         verified prior to the procedure by the nurse. The                         procedure was verified in the procedure room.                        - ASA Grade Assessment: III - A patient with severe                         systemic disease.                        - After reviewing the risks and benefits, the patient                         was deemed in satisfactory condition to undergo the                         procedure.                        After obtaining informed consent, the endoscope was                         passed under direct vision. Throughout the procedure,  the patient's blood pressure, pulse, and oxygen                         saturations were monitored continuously. The Endoscope                         was introduced through the mouth, and advanced to the                         third part of duodenum. The  upper GI endoscopy was                         accomplished without difficulty. The patient tolerated                         the procedure well. Findings:      The esophagus was normal.      Mild portal hypertensive gastropathy was found in the entire examined       stomach. Estimated blood loss: none.      The exam of the stomach was otherwise normal.      The examined duodenum was normal. Impression:            - Normal esophagus.                        - Portal hypertensive gastropathy.                        - Normal examined duodenum.                        - No specimens collected. Recommendation:        - Repeat upper endoscopy in 2 years for surveillance.                        - Proceed with colonoscopy Procedure Code(s):     --- Professional ---                        [id number], Esophagogastroduodenoscopy, flexible,                         transoral; diagnostic, including collection of                         specimen(s) by brushing or washing, when performed                         (separate procedure) Diagnosis Code(s):     --- Professional ---                        K26.7, Chronic duodenal ulcer without hemorrhage or                         perforation                        K31.89, Other diseases of stomach and duodenum                        K76.6, Portal  hypertension CPT copyright 2022 American Medical Association. All rights reserved. The codes documented in this report are preliminary and upon coder review may  be revised to meet current compliance requirements. Ladell MARLA Boss MD, MD 11/23/2023 9:30:43 AM This report has been signed electronically. Number of Addenda: 0 Note Initiated On: 11/23/2023 9:11 AM Estimated Blood Loss:  Estimated blood loss: none.      [facility]

## 2023-11-23 NOTE — Anesthesia Postprocedure Evaluation (Signed)
 Anesthesia Post Note  Patient: Shaun Roberts  Procedure(s) Performed: COLONOSCOPY EGD (ESOPHAGOGASTRODUODENOSCOPY) POLYPECTOMY, INTESTINE  Patient location during evaluation: PACU Anesthesia Type: General Level of consciousness: awake and alert, oriented and patient cooperative Pain management: pain level controlled Vital Signs Assessment: post-procedure vital signs reviewed and stable Respiratory status: spontaneous breathing, nonlabored ventilation and respiratory function stable Cardiovascular status: blood pressure returned to baseline and stable Postop Assessment: adequate PO intake Anesthetic complications: no   No notable events documented.   Last Vitals:  Vitals:   11/23/23 0957 11/23/23 1007  BP: 101/63 106/67  Pulse: 68 69  Resp: 18 (!) 22  Temp:    SpO2: 99% 100%    Last Pain:  Vitals:   11/23/23 1007  TempSrc:   PainSc: 0-No pain                 Alfonso Ruths

## 2023-11-24 ENCOUNTER — Encounter: Payer: Self-pay | Admitting: Internal Medicine

## 2023-11-24 LAB — SURGICAL PATHOLOGY

## 2023-11-28 ENCOUNTER — Other Ambulatory Visit: Payer: Self-pay | Admitting: Primary Care

## 2023-11-28 DIAGNOSIS — Z720 Tobacco use: Secondary | ICD-10-CM

## 2023-11-28 NOTE — Telephone Encounter (Signed)
 Please call patient:  Received refill request for Chantix  for smoking. Did he quit? Is he still taking this?

## 2023-12-01 NOTE — Telephone Encounter (Signed)
 Requested Prescriptions   Signed Prescriptions Disp Refills   varenicline  (CHANTIX ) 1 MG tablet 60 tablet 0    Sig: TAKE 1 TABLET BY MOUTH TWICE DAILY    Authorizing Provider: Solenne Manwarren K   Refill(s) sent to pharmacy.

## 2023-12-01 NOTE — Telephone Encounter (Signed)
 Spoke with patient, states he quit smoking about 6-8 weeks ago and is still taking the Chantix . States he would like one more refill sent in and will make this his last one then stop taking it.

## 2023-12-19 NOTE — Progress Notes (Signed)
 Diabetes Self-Management Education  Visit Type:  Follow-up  Appt. Start Time: 0800 Appt. End Time: 0840  12/26/2023  Shaun Roberts, identified by name and date of birth, is a 58 y.o. male with a diagnosis of Diabetes: Type 2.     ASSESSMENT Pt is here today for follow up alone. Pt reports desired weight gains. Pt denies obtaining a body weight today. Pt reports there are times when he forgets to take his metformin  and states he is using an alarm. Pt denies recent self monitoring blood sugar. Pt reports he desires to focusing on testing his blood sugar monitoring more often. Pt reports his appetite is up and down stating spurts. Pt states missing a meal twice weekly related to running out of time.  Pt reports typical intake of 2-3 meals with 1-2 snacks daily. Pt reports selecting beef sticks, orgain chocolate protein shake and protein bar daily Pt c/o the cost of healthy foods. Pt reports he is going to the gym aiming for daily and states attending 3 days the past two weeks. Pt reports he has successfully omitted smoking. Pt reports constipation is improved and continues taking stool softner administration BID daily. Pt would like to continue his gaol to test blood sugar after meals.  All Pt's questions were answered during this encounter.   There were no vitals taken for this visit. There is no height or weight on file to calculate BMI.  Wt Readings from Last 3 Encounters:  11/23/23 138 lb 12.8 oz (63 kg)  10/18/23 135 lb (61.2 kg)  10/17/23 135 lb 12.8 oz (61.6 kg)    Diabetes Self-Management Education - 12/26/23 0800       Psychosocial Assessment   Patient Belief/Attitude about Diabetes Motivated to manage diabetes    What is the hardest part about your diabetes right now, causing you the most concern, or is the most worrisome to you about your diabetes?   Making healty food and beverage choices;Checking blood sugar    Self-care barriers Unable to determine     Self-management support Doctor's office    Patient Concerns Nutrition/Meal planning;Weight Control    Special Needs None    Preferred Learning Style No preference indicated    Learning Readiness Change in progress      Pre-Education Assessment   Patient understands the diabetes disease and treatment process. Needs Review    Patient understands incorporating nutritional management into lifestyle. Needs Review    Patient undertands incorporating physical activity into lifestyle. Needs Review    Patient understands using medications safely. Needs Review    Patient understands monitoring blood glucose, interpreting and using results Needs Review    Patient understands prevention, detection, and treatment of acute complications. Needs Review    Patient understands prevention, detection, and treatment of chronic complications. Needs Review    Patient understands how to develop strategies to address psychosocial issues. Needs Review    Patient understands how to develop strategies to promote health/change behavior. Needs Review      Complications   Last HgB A1C per patient/outside source 7.8 %    How often do you check your blood sugar? 0 times/day (not testing)    Have you had a dental exam in the past 12 months? No    How many days per week are you checking your feet? 7      Dietary Intake   Snack (morning) beef stick    Lunch salad with dressing, cheese, chicken    Snack (afternoon) protein bar  Snack (evening) oragain protein shake    Beverage(s) water , coffee      Activity / Exercise   Activity / Exercise Type Moderate (swimming / aerobic walking)    How many days per week do you exercise? 3      Patient Education   Previous Diabetes Education Yes    Disease Pathophysiology Explored patient's options for treatment of their diabetes    Healthy Eating Plate Method;Reviewed blood glucose goals for pre and post meals and how to evaluate the patients' food intake on their blood  glucose level.    Being Active Role of exercise on diabetes management, blood pressure control and cardiac health.    Monitoring Identified appropriate SMBG and/or A1C goals.    Acute complications Taught prevention, symptoms, and  treatment of hypoglycemia - the 15 rule.    Chronic complications Identified and discussed with patient  current chronic complications    Diabetes Stress and Support Worked with patient to identify barriers to care and solutions    Lifestyle and Health Coping Lifestyle issues that need to be addressed for better diabetes care      Individualized Goals (developed by patient)   Nutrition General guidelines for healthy choices and portions discussed    Physical Activity Exercise 3-5 times per week    Medications take my medication as prescribed    Monitoring  Test my blood glucose as discussed    Problem Solving Addressing barriers to behavior change    Reducing Risk examine blood glucose patterns    Health Coping Ask for help with psychological, social, or emotional issues      Patient Self-Evaluation of Goals - Patient rates self as meeting previously set goals (% of time)   Nutrition 50 - 75 % (half of the time)    Physical Activity 50 - 75 % (half of the time)    Medications 50 - 75 % (half of the time)    Monitoring < 25% (hardly ever/never)    Problem Solving and behavior change strategies  < 25% (hardly ever/never)    Reducing Risk (treating acute and chronic complications) 50 - 75 % (half of the time)    Health Coping 25 - 50% (sometimes)      Post-Education Assessment   Patient understands the diabetes disease and treatment process. Comprehends key points    Patient understands incorporating nutritional management into lifestyle. Needs Review    Patient undertands incorporating physical activity into lifestyle. Comprehends key points    Patient understands using medications safely. Needs Review    Patient understands monitoring blood glucose,  interpreting and using results Needs Review    Patient understands prevention, detection, and treatment of acute complications. Comprehends key points    Patient understands prevention, detection, and treatment of chronic complications. Comprehends key points    Patient understands how to develop strategies to address psychosocial issues. Needs Review    Patient understands how to develop strategies to promote health/change behavior. Needs Review      Outcomes   Program Status Completed      Subsequent Visit   Since your last visit have you continued or begun to take your medications as prescribed? Yes    Since your last visit, are you checking your blood glucose at least once a day? No          Learning Objective:  Patient will have a greater understanding of diabetes self-management. Patient education plan is to attend individual and/or group sessions per assessed needs and  concerns.   Plan:   Patient Instructions  Consider monitoring blood sugar 2 hour after any meal to evaluate dietary intake to determine best meal choices Continue with high protein snack in addition to meals TID    Expected Outcomes:  Demonstrated limited interest in learning.  Expect minimal changes  If problems or questions, patient to contact team via:  Phone  Future DSME appointment: - 3-4 months

## 2023-12-26 ENCOUNTER — Encounter: Attending: Primary Care | Admitting: Dietician

## 2023-12-26 DIAGNOSIS — E1165 Type 2 diabetes mellitus with hyperglycemia: Secondary | ICD-10-CM | POA: Diagnosis present

## 2023-12-26 DIAGNOSIS — E43 Unspecified severe protein-calorie malnutrition: Secondary | ICD-10-CM | POA: Diagnosis present

## 2023-12-26 NOTE — Patient Instructions (Addendum)
 Consider monitoring blood sugar 2 hours after any meal to evaluate dietary intake to determine best meal choice.  Continue with high protein snack in addition to meals TID! Great job with joining the gym: consider aiming for every other day.

## 2024-01-02 ENCOUNTER — Other Ambulatory Visit: Payer: Self-pay | Admitting: Primary Care

## 2024-01-02 DIAGNOSIS — E1165 Type 2 diabetes mellitus with hyperglycemia: Secondary | ICD-10-CM

## 2024-01-10 ENCOUNTER — Ambulatory Visit: Admitting: Primary Care

## 2024-01-10 ENCOUNTER — Ambulatory Visit: Payer: Self-pay | Admitting: Primary Care

## 2024-01-10 ENCOUNTER — Encounter: Payer: Self-pay | Admitting: Primary Care

## 2024-01-10 VITALS — BP 128/72 | HR 70 | Temp 97.3°F | Ht 71.0 in | Wt 141.0 lb

## 2024-01-10 DIAGNOSIS — E1165 Type 2 diabetes mellitus with hyperglycemia: Secondary | ICD-10-CM | POA: Diagnosis not present

## 2024-01-10 DIAGNOSIS — Z7984 Long term (current) use of oral hypoglycemic drugs: Secondary | ICD-10-CM

## 2024-01-10 LAB — POCT GLYCOSYLATED HEMOGLOBIN (HGB A1C): Hemoglobin A1C: 9.1 % — AB (ref 4.0–5.6)

## 2024-01-10 MED ORDER — METFORMIN HCL ER 500 MG PO TB24: 1000.0000 mg | ORAL_TABLET | Freq: Every day | ORAL | 0 refills | Status: DC

## 2024-01-10 NOTE — Progress Notes (Signed)
 Subjective:    Patient ID: Shaun Roberts, male    DOB: [birth year], 58 y.o.   MRN: [id number]  HPI  Shaun Roberts is a very pleasant 58 y.o. male with a history of type 2 diabetes, alcohol dependence, protein calorie malnutrition, COPD, thrombocytopenia, tobacco use who presents today for follow-up of diabetes.   Current medications include: Metformin  ER 500 mg daily. He denies GI upset, diarrhea, nausea.   He is checking his blood glucose 0 times daily.  Last A1C: 7.8 in May 2025, 9.1  Last Eye Exam: Up-to-date Last Foot Exam: Up-to-date Pneumonia Vaccination: 2019 Urine Microalbumin: Up-to-date Statin: None   Diet currently consists of:  Breakfast: Eggs with cheese/ham/veggies, cereal  Lunch: Sandwich most days, salads  Dinner: Baked chicken and pork chops, veggies Snacks: Protein bars, sugar free cookie bars Desserts: Candy Beverages: Water , coffee   Exercise: Exercising at the gym.     Review of Systems  Eyes:  Negative for visual disturbance.  Respiratory:  Negative for shortness of breath.   Cardiovascular:  Negative for chest pain.  Gastrointestinal:  Negative for abdominal pain, diarrhea and nausea.  Neurological:  Negative for numbness.         Past Medical History:  Diagnosis Date   Acute blood loss anemia 09/04/2022   Acute deep vein thrombosis (DVT) of right popliteal vein (HCC) 08/11/2021   Alcohol abuse    Alcohol withdrawal (HCC) 04/13/2018   Arthritis    Colon polyps    COPD (chronic obstructive pulmonary disease) (HCC)    Depression    Diabetes mellitus without complication (HCC)    Hyperlipidemia    Hypertension     Social History   Socioeconomic History   Marital status: Divorced    Spouse name: Not on file   Number of children: Not on file   Years of education: Not on file   Highest education level: Not on file  Occupational History   Not on file  Tobacco Use   Smoking status: Former    Current packs/day: 1.50    Types:  Cigarettes   Smokeless tobacco: Never  Vaping Use   Vaping status: Never Used  Substance and Sexual Activity   Alcohol use: Not Currently    Comment: Hx of alcoholism   Drug use: Not Currently   Sexual activity: Not Currently  Other Topics Concern   Not on file  Social History Narrative   Not on file   Social Drivers of Health   Financial Resource Strain: Low Risk  (11/01/2023)   Received from [facility]   Overall Financial Resource Strain (CARDIA)    Difficulty of Paying Living Expenses: Not very hard  Food Insecurity: No Food Insecurity (11/01/2023)   [facility] from [facility]   Hunger Vital Sign    Within the past 12 months, you worried that your food would run out before you got the money to buy more.: Never true    Within the past 12 months, the food you bought just didn't last and you didn't have money to get more.: Never true  Transportation Needs: No Transportation Needs (11/01/2023)   Received from [facility] - Transportation    In the past 12 months, has lack of transportation kept you from medical appointments or from getting medications?: No    Lack of Transportation (Non-Medical): No  Physical Activity: Sufficiently Active (01/06/2024)   Exercise Vital Sign    Days of Exercise per Week: 3  days    Minutes of Exercise per Session: 120 min  Stress: No Stress Concern Present (01/06/2024)   [employer] of Occupational [employer] - Occupational Stress Questionnaire    Feeling of Stress: Not at all  Social Connections: Unknown (10/02/2023)   Social Connection and Isolation Panel    Frequency of Communication with Friends and Family: Patient declined    Frequency of Social Gatherings with Friends and Family: Patient declined    Attends Religious Services: Patient declined    Active Member of Clubs or Organizations: Patient declined    Attends Banker Meetings: Not on file    Marital Status: Patient  declined  Intimate Partner Violence: Not At Risk (10/19/2022)   Humiliation, Afraid, Rape, and Kick questionnaire    Fear of Current or Ex-Partner: No    Emotionally Abused: No    Physically Abused: No    Sexually Abused: No    Past Surgical History:  Procedure Laterality Date   ANTERIOR CRUCIATE LIGAMENT REPAIR Right 2016   COLONOSCOPY N/A 09/08/2022   Procedure: COLONOSCOPY;  Surgeon: Toledo, Ladell POUR, MD;  Location: [facility];  Service: Gastroenterology;  Laterality: N/A;   COLONOSCOPY N/A 11/23/2023   Procedure: COLONOSCOPY;  Surgeon: Toledo, Ladell POUR, MD;  Location: [facility];  Service: Gastroenterology;  Laterality: N/A;  DM: 8:45   COLONOSCOPY WITH PROPOFOL  N/A 05/18/2018   Procedure: COLONOSCOPY WITH BIOPSIES;  Surgeon: Jinny Carmine, MD;  Location: [facility];  Service: Endoscopy;  Laterality: N/A;   ESOPHAGOGASTRODUODENOSCOPY N/A 09/08/2022   Procedure: ESOPHAGOGASTRODUODENOSCOPY (EGD);  Surgeon: Toledo, Ladell POUR, MD;  Location: [facility];  Service: Gastroenterology;  Laterality: N/A;   ESOPHAGOGASTRODUODENOSCOPY N/A 11/23/2023   Procedure: EGD (ESOPHAGOGASTRODUODENOSCOPY);  Surgeon: Toledo, Ladell POUR, MD;  Location: [facility];  Service: Gastroenterology;  Laterality: N/A;   HERNIA REPAIR     6 months old   KNEE RECONSTRUCTION     MEDIAL COLLATERAL LIGAMENT AND LATERAL COLLATERAL LIGAMENT REPAIR, KNEE Right 2016   POLYPECTOMY N/A 05/18/2018   Procedure: POLYPECTOMY;  Surgeon: Jinny Carmine, MD;  Location: [facility];  Service: Endoscopy;  Laterality: N/A;   POLYPECTOMY  11/23/2023   Procedure: POLYPECTOMY, INTESTINE;  Surgeon: Toledo, Ladell POUR, MD;  Location: [facility];  Service: Gastroenterology;;   TENDON TRANSFER Right    foot, 2016    Family History  Problem Relation Age of Onset   Depression Mother    Diabetes Mother    Early death Mother    Kidney disease Mother    HIV/AIDS Mother    Alcohol abuse Father    Diabetes  Father    Early death Father    Leukemia Father    Depression Sister    Diabetes Sister    Drug abuse Sister    Early death Sister    Cancer Sister    Early death Paternal Grandfather    Heart attack Paternal Grandfather    Heart disease Paternal Grandfather     No Known Allergies  Current Outpatient Medications on File Prior to Visit  Medication Sig Dispense Refill   acetaminophen  (TYLENOL ) 650 MG CR tablet Take 650 mg by mouth every 8 (eight) hours as needed for pain.     albuterol  (VENTOLIN  HFA) 108 (90 Base) MCG/ACT inhaler Inhale 2 puffs into the lungs every 4 (four) hours as needed for shortness of breath. 1 each 0   Blood Glucose Monitoring Suppl DEVI 1 each by Does not apply route in the morning, at noon, and at bedtime. May  substitute to any manufacturer covered by patient's insurance. 1 each 0   docusate sodium  (COLACE) 100 MG capsule Take 100 mg by mouth 2 (two) times daily.     folic acid  (FOLVITE ) 1 MG tablet Take 1 tablet (1 mg total) by mouth daily. 90 tablet 1   gabapentin  (NEURONTIN ) 100 MG capsule Take 1 capsule (100 mg total) by mouth 2 (two) times daily. For leg pain 180 capsule 1   Glucose Blood (BLOOD GLUCOSE TEST STRIPS) STRP 1 each by In Vitro route in the morning, at noon, and at bedtime. May substitute to any manufacturer covered by patient's insurance. 300 strip 0   Multiple Vitamin (MULTIVITAMIN WITH MINERALS) TABS tablet Take 1 tablet by mouth daily.     Omega-3 Fatty Acids (FISH OIL) 875 MG CAPS Take by mouth.     pantoprazole  (PROTONIX ) 40 MG tablet TAKE ONE TABLET BY MOUTH TWICE DAILY FORULCER PREVENTION 180 tablet 1   spironolactone  (ALDACTONE ) 50 MG tablet Take 1 tablet (50 mg total) by mouth daily. for blood pressure. 90 tablet 1   traZODone  (DESYREL ) 50 MG tablet Take 1 tablet (50 mg total) by mouth at bedtime. For sleep 90 tablet 1   thiamine  (VITAMIN B-1) 50 MG tablet Take 50 mg by mouth daily. (Patient not taking: Reported on 01/10/2024)      varenicline  (CHANTIX ) 1 MG tablet TAKE 1 TABLET BY MOUTH TWICE DAILY (Patient not taking: Reported on 01/10/2024) 60 tablet 0   No current facility-administered medications on file prior to visit.    BP 128/72   Pulse 70   Temp (!) 97.3 F (36.3 C) (Temporal)   Ht 5' 11 (1.803 m)   Wt 141 lb (64 kg)   SpO2 97%   BMI 19.67 kg/m  Objective:   Physical Exam Cardiovascular:     Rate and Rhythm: Normal rate and regular rhythm.  Pulmonary:     Effort: Pulmonary effort is normal.     Breath sounds: Normal breath sounds.  Musculoskeletal:     Cervical back: Neck supple.  Skin:    General: Skin is warm and dry.  Neurological:     Mental Status: He is alert and oriented to person, place, and time.  Psychiatric:        Mood and Affect: Mood normal.           Assessment & Plan:  Type 2 diabetes mellitus with hyperglycemia, without long-term current use of insulin  (HCC) Assessment & Plan: Deteriorated with A1C of 9.1 today.  We discussed to work on limiting processed foods, increase protein and veggies.  Increase metformin  ER to 1000 mg daily.   Follow up in 3 months.   Orders: -     POCT glycosylated hemoglobin (Hb A1C) -     metFORMIN  HCl ER; Take 2 tablets (1,000 mg total) by mouth daily with breakfast. for diabetes.  Dispense: 180 tablet; Refill: 0        Comer MARLA Gaskins, NP

## 2024-01-10 NOTE — Assessment & Plan Note (Signed)
 Deteriorated with A1C of 9.1 today.  We discussed to work on limiting processed foods, increase protein and veggies.  Increase metformin  ER to 1000 mg daily.   Follow up in 3 months.

## 2024-01-10 NOTE — Patient Instructions (Addendum)
 We increased your dose of metformin  to 2 pills once daily for diabetes.   Please schedule a follow up visit for 3 months.  It was a pleasure to see you today!   Diabetes Mellitus and Nutrition, Adult When you have diabetes, or diabetes mellitus, it is very important to have healthy eating habits because your blood sugar (glucose) levels are greatly affected by what you eat and drink. Eating healthy foods in the right amounts, at about the same times every day, can help you: Manage your blood glucose. Lower your risk of heart disease. Improve your blood pressure. Reach or maintain a healthy weight. What can affect my meal plan? Every person with diabetes is different, and each person has different needs for a meal plan. Your health care provider may recommend that you work with a dietitian to make a meal plan that is best for you. Your meal plan may vary depending on factors such as: The calories you need. The medicines you take. Your weight. Your blood glucose, blood pressure, and cholesterol levels. Your activity level. Other health conditions you have, such as heart or kidney disease. How do carbohydrates affect me? Carbohydrates, also called carbs, affect your blood glucose level more than any other type of food. Eating carbs raises the amount of glucose in your blood. It is important to know how many carbs you can safely have in each meal. This is different for every person. Your dietitian can help you calculate how many carbs you should have at each meal and for each snack. How does alcohol affect me? Alcohol can cause a decrease in blood glucose (hypoglycemia), especially if you use insulin  or take certain diabetes medicines by mouth. Hypoglycemia can be a life-threatening condition. Symptoms of hypoglycemia, such as sleepiness, dizziness, and confusion, are similar to symptoms of having too much alcohol. Do not drink alcohol if: Your health care provider tells you not to drink. You  are pregnant, may be pregnant, or are planning to become pregnant. If you drink alcohol: Limit how much you have to: 0-1 drink a day for women. 0-2 drinks a day for men. Know how much alcohol is in your drink. In the [place] one drink equals one 12 oz bottle of beer (355 mL), one 5 oz glass of wine (148 mL), or one 1 oz glass of hard liquor (44 mL). Keep yourself hydrated with water , diet soda, or unsweetened iced tea. Keep in mind that regular soda, juice, and other mixers may contain a lot of sugar and must be counted as carbs. What are tips for following this plan?  Reading food labels Start by checking the serving size on the Nutrition Facts label of packaged foods and drinks. The number of calories and the amount of carbs, fats, and other nutrients listed on the label are based on one serving of the item. Many items contain more than one serving per package. Check the total grams (g) of carbs in one serving. Check the number of grams of saturated fats and trans fats in one serving. Choose foods that have a low amount or none of these fats. Check the number of milligrams (mg) of salt (sodium) in one serving. Most people should limit total sodium intake to less than 2,300 mg per day. Always check the nutrition information of foods labeled as low-fat or nonfat. These foods may be higher in added sugar or refined carbs and should be avoided. Talk to your dietitian to identify your daily goals for nutrients listed on  the label. Shopping Avoid buying canned, pre-made, or processed foods. These foods tend to be high in fat, sodium, and added sugar. Shop around the outside edge of the grocery store. This is where you will most often find fresh fruits and vegetables, bulk grains, fresh meats, and fresh dairy products. Cooking Use low-heat cooking methods, such as baking, instead of high-heat cooking methods, such as deep frying. Cook using healthy oils, such as olive, canola, or sunflower  oil. Avoid cooking with butter, cream, or high-fat meats. Meal planning Eat meals and snacks regularly, preferably at the same times every day. Avoid going long periods of time without eating. Eat foods that are high in fiber, such as fresh fruits, vegetables, beans, and whole grains. Eat 4-6 oz (112-168 g) of lean protein each day, such as lean meat, chicken, fish, eggs, or tofu. One ounce (oz) (28 g) of lean protein is equal to: 1 oz (28 g) of meat, chicken, or fish. 1 egg.  cup (62 g) of tofu. Eat some foods each day that contain healthy fats, such as avocado, nuts, seeds, and fish. What foods should I eat? Fruits Berries. Apples. Oranges. Peaches. Apricots. Plums. Grapes. Mangoes. Papayas. Pomegranates. Kiwi. Cherries. Vegetables Leafy greens, including lettuce, spinach, kale, chard, collard greens, mustard greens, and cabbage. Beets. Cauliflower. Broccoli. Carrots. Green beans. Tomatoes. Peppers. Onions. Cucumbers. Brussels sprouts. Grains Whole grains, such as whole-wheat or whole-grain bread, crackers, tortillas, cereal, and pasta. Unsweetened oatmeal. Quinoa. Brown or wild rice. Meats and other proteins Seafood. Poultry without skin. Lean cuts of poultry and beef. Tofu. Nuts. Seeds. Dairy Low-fat or fat-free dairy products such as milk, yogurt, and cheese. The items listed above may not be a complete list of foods and beverages you can eat and drink. Contact a dietitian for more information. What foods should I avoid? Fruits Fruits canned with syrup. Vegetables Canned vegetables. Frozen vegetables with butter or cream sauce. Grains Refined white flour and flour products such as bread, pasta, snack foods, and cereals. Avoid all processed foods. Meats and other proteins Fatty cuts of meat. Poultry with skin. Breaded or fried meats. Processed meat. Avoid saturated fats. Dairy Full-fat yogurt, cheese, or milk. Beverages Sweetened drinks, such as soda or iced tea. The items  listed above may not be a complete list of foods and beverages you should avoid. Contact a dietitian for more information. Questions to ask a health care provider Do I need to meet with a certified diabetes care and education specialist? Do I need to meet with a dietitian? What number can I call if I have questions? When are the best times to check my blood glucose? Where to find more information: American Diabetes Association: diabetes.org Academy of Nutrition and Dietetics: eatright.[employer] of Diabetes and Digestive and Kidney Diseases: StageSync.si Association of Diabetes Care & Education Specialists: diabeteseducator.org Summary It is important to have healthy eating habits because your blood sugar (glucose) levels are greatly affected by what you eat and drink. It is important to use alcohol carefully. A healthy meal plan will help you manage your blood glucose and lower your risk of heart disease. Your health care provider may recommend that you work with a dietitian to make a meal plan that is best for you. This information is not intended to replace advice given to you by your health care provider. Make sure you discuss any questions you have with your health care provider. Document Revised: 12/18/2019 Document Reviewed: 12/19/2019 Elsevier Patient Education  2024 [employer].

## 2024-01-21 ENCOUNTER — Other Ambulatory Visit: Payer: Self-pay | Admitting: Primary Care

## 2024-01-21 DIAGNOSIS — K7031 Alcoholic cirrhosis of liver with ascites: Secondary | ICD-10-CM

## 2024-01-22 ENCOUNTER — Other Ambulatory Visit: Payer: Self-pay | Admitting: Primary Care

## 2024-01-22 DIAGNOSIS — F1021 Alcohol dependence, in remission: Secondary | ICD-10-CM

## 2024-01-22 DIAGNOSIS — Z8719 Personal history of other diseases of the digestive system: Secondary | ICD-10-CM

## 2024-03-01 ENCOUNTER — Ambulatory Visit

## 2024-04-11 ENCOUNTER — Ambulatory Visit (INDEPENDENT_AMBULATORY_CARE_PROVIDER_SITE_OTHER): Admitting: Primary Care

## 2024-04-11 ENCOUNTER — Other Ambulatory Visit: Payer: Self-pay | Admitting: Primary Care

## 2024-04-11 ENCOUNTER — Ambulatory Visit: Payer: Self-pay | Admitting: Primary Care

## 2024-04-11 ENCOUNTER — Encounter: Payer: Self-pay | Admitting: Primary Care

## 2024-04-11 VITALS — BP 134/68 | HR 87 | Temp 97.8°F | Ht 71.0 in | Wt 152.5 lb

## 2024-04-11 DIAGNOSIS — E1165 Type 2 diabetes mellitus with hyperglycemia: Secondary | ICD-10-CM | POA: Diagnosis not present

## 2024-04-11 DIAGNOSIS — Z7984 Long term (current) use of oral hypoglycemic drugs: Secondary | ICD-10-CM | POA: Diagnosis not present

## 2024-04-11 DIAGNOSIS — Z23 Encounter for immunization: Secondary | ICD-10-CM | POA: Diagnosis not present

## 2024-04-11 LAB — POCT GLYCOSYLATED HEMOGLOBIN (HGB A1C): Hemoglobin A1C: 9.8 % — AB (ref 4.0–5.6)

## 2024-04-11 MED ORDER — GLIPIZIDE ER 10 MG PO TB24: 10.0000 mg | ORAL_TABLET | Freq: Every day | ORAL | 1 refills | Status: AC

## 2024-04-11 NOTE — Patient Instructions (Signed)
 Start glipizide XL 10 mg daily for diabetes.  Continue metformin  500 mg twice daily for diabetes.  Start checking your blood sugar levels.  Appropriate times to check your blood sugar levels are:  -Before any meal (breakfast, lunch, dinner) -Two hours after any meal (breakfast, lunch, dinner) -Bedtime  Record your readings and notify me if you continue to consistently run at or above 150.   Start working on the pnc financial and exercise.  Please schedule a follow up visit for 3 months.  It was a pleasure to see you today!

## 2024-04-11 NOTE — Progress Notes (Signed)
 Subjective:    Patient ID: Shaun Roberts, male    DOB: [birth year], 58 y.o.   MRN: [id number]  Shaun Roberts is a very pleasant 58 y.o. male with a history of COPD, hepatic cirrhosis, type 2 diabetes, alcohol dependence, tobacco use, GI bleed who presents today for follow-up diabetes.  1) Type 2 Diabetes:  Current medications include: Metformin  ER 1000 mg daily. He cannot tolerate taking 2 metformin  pills at once due to GI upset.  He is checking his blood glucose 0 times daily.  Last A1C: 9.1 in August 2025, 9.8 today Last Eye Exam: Up-to-date Last Foot Exam: Up-to-date Pneumonia Vaccination: 2019 Urine Microalbumin: Up-to-date Statin: None  Dietary changes since last visit: Increased fast food and processed foods.    Exercise: None.  BP Readings from Last 3 Encounters:  04/11/24 134/68  01/10/24 128/72  11/23/23 106/67   Wt Readings from Last 3 Encounters:  04/11/24 152 lb 8 oz (69.2 kg)  01/10/24 141 lb (64 kg)  11/23/23 138 lb 12.8 oz (63 kg)      Review of Systems  Respiratory:  Negative for shortness of breath.   Cardiovascular:  Negative for chest pain.  Gastrointestinal:  Negative for abdominal pain and diarrhea.  Neurological:  Negative for numbness.         Past Medical History:  Diagnosis Date   Acute blood loss anemia 09/04/2022   Acute deep vein thrombosis (DVT) of right popliteal vein (HCC) 08/11/2021   Alcohol abuse    Alcohol withdrawal (HCC) 04/13/2018   Arthritis    Colon polyps    COPD (chronic obstructive pulmonary disease) (HCC)    Depression    Diabetes mellitus without complication (HCC)    Hyperlipidemia    Hypertension     Social History   Socioeconomic History   Marital status: Divorced    Spouse name: Not on file   Number of children: Not on file   Years of education: Not on file   Highest education level: Not on file  Occupational History   Not on file  Tobacco Use   Smoking status: Former    Current packs/day: 1.50     Types: Cigarettes   Smokeless tobacco: Never  Vaping Use   Vaping status: Never Used  Substance and Sexual Activity   Alcohol use: Not Currently    Comment: Hx of alcoholism   Drug use: Not Currently   Sexual activity: Not Currently  Other Topics Concern   Not on file  Social History Narrative   Not on file   Social Drivers of Health   Financial Resource Strain: Low Risk  (11/01/2023)   Received from [facility]   Overall Financial Resource Strain (CARDIA)    Difficulty of Paying Living Expenses: Not very hard  Food Insecurity: No Food Insecurity (11/01/2023)   Received from [facility]   Hunger Vital Sign    Within the past 12 months, you worried that your food would run out before you got the money to buy more.: Never true    Within the past 12 months, the food you bought just didn't last and you didn't have money to get more.: Never true  Transportation Needs: No Transportation Needs (11/01/2023)   Received from [facility] - Transportation    In the past 12 months, has lack of transportation kept you from medical appointments or from getting medications?: No    Lack of Transportation (Non-Medical): No  Physical  Activity: Sufficiently Active (01/06/2024)   Exercise Vital Sign    Days of Exercise per Week: 3 days    Minutes of Exercise per Session: 120 min  Stress: No Stress Concern Present (01/06/2024)   [employer] - Occupational Stress Questionnaire    Feeling of Stress: Not at all  Social Connections: Unknown (10/02/2023)   Social Connection and Isolation Panel    Frequency of Communication with Friends and Family: Patient declined    Frequency of Social Gatherings with Friends and Family: Patient declined    Attends Religious Services: Patient declined    Active Member of Clubs or Organizations: Patient declined    Attends Banker Meetings: Not on file    Marital  Status: Patient declined  Intimate Partner Violence: Not At Risk (10/19/2022)   Humiliation, Afraid, Rape, and Kick questionnaire    Fear of Current or Ex-Partner: No    Emotionally Abused: No    Physically Abused: No    Sexually Abused: No    Past Surgical History:  Procedure Laterality Date   ANTERIOR CRUCIATE LIGAMENT REPAIR Right 2016   COLONOSCOPY N/A 09/08/2022   Procedure: COLONOSCOPY;  Surgeon: Toledo, Ladell POUR, MD;  Location: [facility];  Service: Gastroenterology;  Laterality: N/A;   COLONOSCOPY N/A 11/23/2023   Procedure: COLONOSCOPY;  Surgeon: Toledo, Ladell POUR, MD;  Location: [facility];  Service: Gastroenterology;  Laterality: N/A;  DM: 8:45   COLONOSCOPY WITH PROPOFOL  N/A 05/18/2018   Procedure: COLONOSCOPY WITH BIOPSIES;  Surgeon: Jinny Carmine, MD;  Location: [facility];  Service: Endoscopy;  Laterality: N/A;   ESOPHAGOGASTRODUODENOSCOPY N/A 09/08/2022   Procedure: ESOPHAGOGASTRODUODENOSCOPY (EGD);  Surgeon: Toledo, Ladell POUR, MD;  Location: [facility];  Service: Gastroenterology;  Laterality: N/A;   ESOPHAGOGASTRODUODENOSCOPY N/A 11/23/2023   Procedure: EGD (ESOPHAGOGASTRODUODENOSCOPY);  Surgeon: Toledo, Ladell POUR, MD;  Location: [facility];  Service: Gastroenterology;  Laterality: N/A;   HERNIA REPAIR     6 months old   KNEE RECONSTRUCTION     MEDIAL COLLATERAL LIGAMENT AND LATERAL COLLATERAL LIGAMENT REPAIR, KNEE Right 2016   POLYPECTOMY N/A 05/18/2018   Procedure: POLYPECTOMY;  Surgeon: Jinny Carmine, MD;  Location: [facility];  Service: Endoscopy;  Laterality: N/A;   POLYPECTOMY  11/23/2023   Procedure: POLYPECTOMY, INTESTINE;  Surgeon: Toledo, Ladell POUR, MD;  Location: [facility];  Service: Gastroenterology;;   TENDON TRANSFER Right    foot, 2016    Family History  Problem Relation Age of Onset   Depression Mother    Diabetes Mother    Early death Mother    Kidney disease Mother    HIV/AIDS Mother    Alcohol abuse Father     Diabetes Father    Early death Father    Leukemia Father    Depression Sister    Diabetes Sister    Drug abuse Sister    Early death Sister    Cancer Sister    Early death Paternal Grandfather    Heart attack Paternal Grandfather    Heart disease Paternal Grandfather     No Known Allergies  Current Outpatient Medications on File Prior to Visit  Medication Sig Dispense Refill   acetaminophen  (TYLENOL ) 650 MG CR tablet Take 650 mg by mouth every 8 (eight) hours as needed for pain.     albuterol  (VENTOLIN  HFA) 108 (90 Base) MCG/ACT inhaler Inhale 2 puffs into the lungs every 4 (four) hours as needed for shortness of breath. 1 each 0   Blood Glucose Monitoring  Suppl DEVI 1 each by Does not apply route in the morning, at noon, and at bedtime. May substitute to any manufacturer covered by patient's insurance. 1 each 0   docusate sodium  (COLACE) 100 MG capsule Take 100 mg by mouth 2 (two) times daily.     folic acid  (FOLVITE ) 1 MG tablet TAKE ONE TABLET BY MOUTH ONCE DAILY 90 tablet 1   gabapentin  (NEURONTIN ) 100 MG capsule Take 1 capsule (100 mg total) by mouth 2 (two) times daily. For leg pain 180 capsule 1   Glucose Blood (BLOOD GLUCOSE TEST STRIPS) STRP 1 each by In Vitro route in the morning, at noon, and at bedtime. May substitute to any manufacturer covered by patient's insurance. 300 strip 0   metFORMIN  (GLUCOPHAGE -XR) 500 MG 24 hr tablet Take 2 tablets (1,000 mg total) by mouth daily with breakfast. for diabetes. 180 tablet 0   Multiple Vitamin (MULTIVITAMIN WITH MINERALS) TABS tablet Take 1 tablet by mouth daily.     Omega-3 Fatty Acids (FISH OIL) 875 MG CAPS Take by mouth.     pantoprazole  (PROTONIX ) 40 MG tablet TAKE ONE TABLET BY MOUTH TWICE DAILY FORULCER PREVENTION 180 tablet 1   spironolactone  (ALDACTONE ) 50 MG tablet TAKE ONE TABLET BY MOUTH ONCE DAILY FOR BLOOD PRESSURE 90 tablet 1   traZODone  (DESYREL ) 50 MG tablet Take 1 tablet (50 mg total) by mouth at bedtime. For  sleep (Patient taking differently: Take 50 mg by mouth at bedtime. For sleep. As needed) 90 tablet 1   No current facility-administered medications on file prior to visit.    BP 134/68   Pulse 87   Temp 97.8 F (36.6 C) (Oral)   Ht 5' 11 (1.803 m)   Wt 152 lb 8 oz (69.2 kg)   SpO2 99%   BMI 21.27 kg/m  Objective:   Physical Exam Cardiovascular:     Rate and Rhythm: Normal rate and regular rhythm.  Pulmonary:     Effort: Pulmonary effort is normal.     Breath sounds: Normal breath sounds.  Musculoskeletal:     Cervical back: Neck supple.  Skin:    General: Skin is warm and dry.  Neurological:     Mental Status: He is alert and oriented to person, place, and time.  Psychiatric:        Mood and Affect: Mood normal.     Physical Exam        Assessment & Plan:  Type 2 diabetes mellitus with hyperglycemia, without long-term current use of insulin  (HCC) Assessment & Plan: Deteriorated with A1C of 9.8.  Continue metformin  ER 500 mg BID. Cannot increase dose due to GI upset. Add Glipizide XL 10 mg daily.  Discussed to start checking glucose levels. He has a glucometer at home. He will work on improving his diet and exercise.  Follow up in 3 months.  Orders: -     POCT glycosylated hemoglobin (Hb A1C) -     glipiZIDE ER; Take 1 tablet (10 mg total) by mouth daily with breakfast. for diabetes.  Dispense: 90 tablet; Refill: 1  Need for influenza vaccination -     Flu vaccine trivalent PF, 6mos and older(Flulaval,Afluria,Fluarix,Fluzone)    Assessment and Plan Assessment & Plan       Comer MARLA Gaskins, NP    History of Present Illness

## 2024-04-11 NOTE — Assessment & Plan Note (Signed)
 Deteriorated with A1C of 9.8.  Continue metformin  ER 500 mg BID. Cannot increase dose due to GI upset. Add Glipizide XL 10 mg daily.  Discussed to start checking glucose levels. He has a glucometer at home. He will work on improving his diet and exercise.  Follow up in 3 months.

## 2024-05-02 ENCOUNTER — Encounter: Payer: Self-pay | Admitting: Gastroenterology

## 2024-05-07 ENCOUNTER — Other Ambulatory Visit: Payer: Self-pay | Admitting: Gastroenterology

## 2024-05-07 DIAGNOSIS — K7030 Alcoholic cirrhosis of liver without ascites: Secondary | ICD-10-CM

## 2024-05-08 ENCOUNTER — Telehealth: Payer: Self-pay

## 2024-05-08 NOTE — Telephone Encounter (Signed)
 MyChart message sent to patient asking if ENT referral has been completed that was placed on 06/15/2023

## 2024-05-09 ENCOUNTER — Ambulatory Visit: Admission: RE | Admit: 2024-05-09 | Discharge: 2024-05-09 | Attending: Gastroenterology

## 2024-05-09 DIAGNOSIS — K7030 Alcoholic cirrhosis of liver without ascites: Secondary | ICD-10-CM | POA: Diagnosis present

## 2024-05-09 NOTE — Telephone Encounter (Signed)
 Referral closed

## 2024-05-10 ENCOUNTER — Telehealth: Payer: Self-pay | Admitting: Primary Care

## 2024-05-10 NOTE — Telephone Encounter (Unsigned)
 Copied from CRM #[id number]. Topic: General - Other >> May 09, 2024  9:45 AM [name] wrote: Reason for CRM: Patient needing to speak to office manager or Dr.Clark he's getting medicare advantage they sent a assentation form but not showing any records of him being a patient at the clinic or being being by Comer Gaskins

## 2024-05-15 NOTE — Telephone Encounter (Signed)
 Left vm for patient to have the insurance company fax form to us  again at [phone].

## 2024-07-12 ENCOUNTER — Ambulatory Visit: Payer: Self-pay | Admitting: Primary Care
